# Patient Record
Sex: Male | Born: 1965 | Race: White | Hispanic: No | Marital: Married | Smoking: Former smoker
Health system: Southern US, Community
[De-identification: ages and names within clinical notes are randomized; demographics above are authoritative.]

## PROBLEM LIST (undated history)

## (undated) DIAGNOSIS — E785 Hyperlipidemia, unspecified: Secondary | ICD-10-CM

## (undated) DIAGNOSIS — E669 Obesity, unspecified: Secondary | ICD-10-CM

## (undated) DIAGNOSIS — F988 Other specified behavioral and emotional disorders with onset usually occurring in childhood and adolescence: Secondary | ICD-10-CM

## (undated) DIAGNOSIS — E291 Testicular hypofunction: Secondary | ICD-10-CM

## (undated) DIAGNOSIS — Z9989 Dependence on other enabling machines and devices: Secondary | ICD-10-CM

## (undated) DIAGNOSIS — G4733 Obstructive sleep apnea (adult) (pediatric): Secondary | ICD-10-CM

## (undated) DIAGNOSIS — E119 Type 2 diabetes mellitus without complications: Secondary | ICD-10-CM

## (undated) DIAGNOSIS — I10 Essential (primary) hypertension: Secondary | ICD-10-CM

## (undated) DIAGNOSIS — Z803 Family history of malignant neoplasm of breast: Secondary | ICD-10-CM

## (undated) HISTORY — DX: Family history of malignant neoplasm of breast: Z80.3

## (undated) HISTORY — DX: Other specified behavioral and emotional disorders with onset usually occurring in childhood and adolescence: F98.8

## (undated) HISTORY — DX: Obesity, unspecified: E66.9

## (undated) HISTORY — PX: COLONOSCOPY: SHX174

## (undated) HISTORY — PX: CARDIAC CATHETERIZATION: SHX172

## (undated) HISTORY — DX: Obstructive sleep apnea (adult) (pediatric): G47.33

## (undated) HISTORY — DX: Testicular hypofunction: E29.1

## (undated) HISTORY — DX: Dependence on other enabling machines and devices: Z99.89

## (undated) HISTORY — PX: RHINOPLASTY: SHX2354

## (undated) HISTORY — DX: Hyperlipidemia, unspecified: E78.5

## (undated) HISTORY — DX: Type 2 diabetes mellitus without complications: E11.9

## (undated) HISTORY — DX: Essential (primary) hypertension: I10

---

## 2012-11-26 ENCOUNTER — Ambulatory Visit: Payer: Self-pay | Admitting: Bariatrics

## 2012-11-28 ENCOUNTER — Ambulatory Visit: Payer: Self-pay | Admitting: Bariatrics

## 2012-11-28 DIAGNOSIS — Z01810 Encounter for preprocedural cardiovascular examination: Secondary | ICD-10-CM

## 2012-11-28 LAB — CBC WITH DIFFERENTIAL/PLATELET
Basophil #: 0.1 10*3/uL (ref 0.0–0.1)
Basophil %: 0.8 %
Eosinophil %: 2.6 %
HCT: 48.7 % (ref 40.0–52.0)
HGB: 17.1 g/dL (ref 13.0–18.0)
Lymphocyte #: 2.2 10*3/uL (ref 1.0–3.6)
MCH: 30.4 pg (ref 26.0–34.0)
MCHC: 35.1 g/dL (ref 32.0–36.0)
Monocyte #: 0.8 x10 3/mm (ref 0.2–1.0)
Monocyte %: 9 %
Neutrophil #: 5.1 10*3/uL (ref 1.4–6.5)
Neutrophil %: 61.1 %
Platelet: 217 10*3/uL (ref 150–440)
RBC: 5.64 10*6/uL (ref 4.40–5.90)
RDW: 13.8 % (ref 11.5–14.5)

## 2012-11-28 LAB — BILIRUBIN, DIRECT: Bilirubin, Direct: 0.2 mg/dL (ref 0.00–0.20)

## 2012-11-28 LAB — COMPREHENSIVE METABOLIC PANEL
Anion Gap: 4 — ABNORMAL LOW (ref 7–16)
Bilirubin,Total: 0.7 mg/dL (ref 0.2–1.0)
Chloride: 104 mmol/L (ref 98–107)
Co2: 29 mmol/L (ref 21–32)
EGFR (African American): >60
EGFR (Non-African Amer.): >60
Glucose: 107 mg/dL — ABNORMAL HIGH (ref 65–99)
Osmolality: 275 (ref 275–301)
Potassium: 3.9 mmol/L (ref 3.5–5.1)
SGOT(AST): 27 U/L (ref 15–37)
SGPT (ALT): 48 U/L (ref 12–78)
Sodium: 137 mmol/L (ref 136–145)

## 2012-11-28 LAB — LIPASE, BLOOD: Lipase: 350 U/L (ref 73–393)

## 2012-11-28 LAB — PROTIME-INR: INR: 0.9

## 2012-11-28 LAB — IRON AND TIBC
Iron Bind.Cap.(Total): 332 ug/dL (ref 250–450)
Iron Saturation: 38 %
Iron: 125 ug/dL (ref 65–175)

## 2012-11-28 LAB — MAGNESIUM: Magnesium: 1.8 mg/dL

## 2012-11-28 LAB — FOLATE: Folic Acid: 15.2 ng/mL (ref 3.1–100.0)

## 2012-11-28 LAB — TSH: Thyroid Stimulating Horm: 1.37 u[IU]/mL

## 2012-11-28 LAB — PHOSPHORUS: Phosphorus: 2.8 mg/dL (ref 2.5–4.9)

## 2012-12-06 ENCOUNTER — Ambulatory Visit: Payer: Self-pay | Admitting: Bariatrics

## 2012-12-16 ENCOUNTER — Ambulatory Visit: Payer: Self-pay | Admitting: Bariatrics

## 2013-01-22 ENCOUNTER — Ambulatory Visit: Payer: Self-pay | Admitting: Bariatrics

## 2013-04-05 HISTORY — PX: LAPAROSCOPIC GASTRIC SLEEVE RESECTION: SHX5895

## 2015-05-05 ENCOUNTER — Ambulatory Visit (INDEPENDENT_AMBULATORY_CARE_PROVIDER_SITE_OTHER): Payer: BLUE CROSS/BLUE SHIELD | Admitting: Family Medicine

## 2015-05-05 ENCOUNTER — Encounter: Payer: Self-pay | Admitting: Family Medicine

## 2015-05-05 VITALS — BP 135/81 | HR 65 | Temp 98.4°F | Ht 68.7 in | Wt 200.0 lb

## 2015-05-05 DIAGNOSIS — E291 Testicular hypofunction: Secondary | ICD-10-CM | POA: Diagnosis not present

## 2015-05-05 DIAGNOSIS — E119 Type 2 diabetes mellitus without complications: Secondary | ICD-10-CM | POA: Insufficient documentation

## 2015-05-05 DIAGNOSIS — E1122 Type 2 diabetes mellitus with diabetic chronic kidney disease: Secondary | ICD-10-CM | POA: Insufficient documentation

## 2015-05-05 MED ORDER — TESTOSTERONE 20.25 MG/ACT (1.62%) TD GEL: 4.0000 "application " | Freq: Every morning | TRANSDERMAL | Status: DC

## 2015-05-05 NOTE — Progress Notes (Signed)
   BP 135/81 mmHg  Pulse 65  Temp(Src) 98.4 F (36.9 C)  Ht 5' 8.7" (1.745 m)  Wt 200 lb (90.719 kg)  BMI 29.79 kg/m2  SpO2 98%   Subjective:    Patient ID: Jesse Dunlap, male    DOB: December 18, 1965, 49 y.o.   MRN: [id number]  HPI: Jesse Dunlap is a 49 y.o. male  Chief Complaint  Patient presents with  . Diabetes  . Hypogonadism  after surgery lost at least 60-70 lbs  No longer on meds for DM glu doing well Testosterone gel every day and doing well No c/o Normal energy Still working out   Relevant past medical, surgical, family and social history reviewed and updated as indicated. Interim medical history since our last visit reviewed. Allergies and medications reviewed and updated.  Review of Systems  Constitutional: Negative.   Respiratory: Negative.   Cardiovascular: Negative.     Per HPI unless specifically indicated above     Objective:    BP 135/81 mmHg  Pulse 65  Temp(Src) 98.4 F (36.9 C)  Ht 5' 8.7" (1.745 m)  Wt 200 lb (90.719 kg)  BMI 29.79 kg/m2  SpO2 98%  Wt Readings from Last 3 Encounters:  05/05/15 200 lb (90.719 kg)  10/21/14 20 lb (9.072 kg)    Physical Exam  Constitutional: He is oriented to person, place, and time. He appears well-developed and well-nourished. No distress.  HENT:  Head: Normocephalic and atraumatic.  Right Ear: Hearing normal.  Left Ear: Hearing normal.  Nose: Nose normal.  Eyes: Conjunctivae and lids are normal. Right eye exhibits no discharge. Left eye exhibits no discharge. No scleral icterus.  Cardiovascular: Normal rate, regular rhythm and normal heart sounds.   Pulmonary/Chest: Effort normal and breath sounds normal. No respiratory distress.  Musculoskeletal: Normal range of motion.  Neurological: He is alert and oriented to person, place, and time.  Skin: Skin is intact. No rash noted.  Psychiatric: He has a normal mood and affect. His speech is normal and behavior is normal. Judgment and thought content  normal. Cognition and memory are normal.        Assessment & Plan:   Problem List Items Addressed This Visit      Endocrine   Hypogonadism in male    The current medical regimen is effective;  continue present plan and medications.       Relevant Medications   Testosterone 20.25 MG/ACT (1.62%) GEL   Other Relevant Orders   CBC with Differential/Platelet   PSA   Testosterone   RESOLVED: Diabetes mellitus without complication - Primary   Relevant Orders   Bayer DCA Hb A1c Waived       Follow up plan: Return in about 6 months (around 11/04/2015) for Physical Exam and testosterone level.

## 2015-05-05 NOTE — Assessment & Plan Note (Signed)
The current medical regimen is effective;  continue present plan and medications.  

## 2015-05-10 LAB — CBC WITH DIFFERENTIAL/PLATELET
Basophils Absolute: 0 10*3/uL (ref 0.0–0.2)
Basos: 0 %
EOS (ABSOLUTE): 0.1 10*3/uL (ref 0.0–0.4)
Eos: 3 %
Hematocrit: 47.2 % (ref 37.5–51.0)
Hemoglobin: 16.5 g/dL (ref 12.6–17.7)
Immature Grans (Abs): 0 10*3/uL (ref 0.0–0.1)
Immature Granulocytes: 0 %
Lymphocytes Absolute: 1.8 10*3/uL (ref 0.7–3.1)
Lymphs: 34 %
MCH: 30.8 pg (ref 26.6–33.0)
MCHC: 35 g/dL (ref 31.5–35.7)
MCV: 88 fL (ref 79–97)
MONOCYTES: 10 %
Monocytes Absolute: 0.5 10*3/uL (ref 0.1–0.9)
Neutrophils Absolute: 2.8 10*3/uL (ref 1.4–7.0)
Neutrophils: 53 %
PLATELETS: 219 10*3/uL (ref 150–379)
RBC: 5.36 x10E6/uL (ref 4.14–5.80)
RDW: 13.8 % (ref 12.3–15.4)
WBC: 5.2 10*3/uL (ref 3.4–10.8)

## 2015-05-10 LAB — TESTOSTERONE: Testosterone: 556 ng/dL (ref 348–1197)

## 2015-05-10 LAB — HGB A1C W/O EAG: Hgb A1c MFr Bld: 5.6 % (ref 4.8–5.6)

## 2015-05-10 LAB — PSA: PROSTATE SPECIFIC AG, SERUM: 3.6 ng/mL (ref 0.0–4.0)

## 2015-11-15 ENCOUNTER — Encounter: Payer: BLUE CROSS/BLUE SHIELD | Admitting: Family Medicine

## 2016-01-04 ENCOUNTER — Encounter: Payer: BLUE CROSS/BLUE SHIELD | Admitting: Family Medicine

## 2016-02-06 ENCOUNTER — Encounter: Payer: Self-pay | Admitting: Family Medicine

## 2016-02-06 ENCOUNTER — Ambulatory Visit (INDEPENDENT_AMBULATORY_CARE_PROVIDER_SITE_OTHER): Payer: 59 | Admitting: Family Medicine

## 2016-02-06 VITALS — BP 136/86 | HR 79 | Temp 98.8°F | Ht 69.0 in | Wt 198.0 lb

## 2016-02-06 DIAGNOSIS — E119 Type 2 diabetes mellitus without complications: Secondary | ICD-10-CM

## 2016-02-06 DIAGNOSIS — Z1211 Encounter for screening for malignant neoplasm of colon: Secondary | ICD-10-CM

## 2016-02-06 DIAGNOSIS — E291 Testicular hypofunction: Secondary | ICD-10-CM

## 2016-02-06 DIAGNOSIS — Z0000 Encounter for general adult medical examination without abnormal findings: Secondary | ICD-10-CM

## 2016-02-06 DIAGNOSIS — I10 Essential (primary) hypertension: Secondary | ICD-10-CM | POA: Insufficient documentation

## 2016-02-06 LAB — MICROALBUMIN, URINE WAIVED
CREATININE, URINE WAIVED: 200 mg/dL (ref 10–300)
Microalb, Ur Waived: 10 mg/L (ref 0–19)

## 2016-02-06 LAB — URINALYSIS, ROUTINE W REFLEX MICROSCOPIC
Bilirubin, UA: NEGATIVE
Glucose, UA: NEGATIVE
KETONES UA: NEGATIVE
LEUKOCYTES UA: NEGATIVE
NITRITE UA: NEGATIVE
Protein, UA: NEGATIVE
RBC, UA: NEGATIVE
Specific Gravity, UA: 1.02 (ref 1.005–1.030)
Urobilinogen, Ur: 0.2 mg/dL (ref 0.2–1.0)
pH, UA: 6 (ref 5.0–7.5)

## 2016-02-06 LAB — BAYER DCA HB A1C WAIVED: HB A1C (BAYER DCA - WAIVED): 5.5 % (ref <7.0)

## 2016-02-06 MED ORDER — TESTOSTERONE 20.25 MG/ACT (1.62%) TD GEL: 4.0000 "application " | Freq: Every morning | TRANSDERMAL | Status: DC

## 2016-02-06 NOTE — Assessment & Plan Note (Addendum)
The current medical regimen is effective;  continue present plan and medications. Discuss checking every 6 mo

## 2016-02-06 NOTE — Assessment & Plan Note (Signed)
Diet controled 

## 2016-02-06 NOTE — Progress Notes (Signed)
BP 136/86 mmHg  Pulse 79  Temp(Src) 98.8 F (37.1 C)  Ht 5\' 9"  (1.753 m)  Wt 198 lb (89.812 kg)  BMI 29.23 kg/m2  SpO2 99%   Subjective:    Patient ID: Jesse Dunlap, male    DOB: February 21, 1966, 50 y.o.   MRN: [id number]  HPI: Jesse Dunlap is a 50 y.o. male  Chief Complaint  Patient presents with  . Annual Exam  Patient doing well, has maintained weight being off lost about 70 pounds or so blood pressure is now diet-controlled diabetes is now diet controlled Doing testosterone gel with no complaints or concerns applying gel every day  Relevant past medical, surgical, family and social history reviewed and updated as indicated. Interim medical history since our last visit reviewed. Allergies and medications reviewed and updated.  Review of Systems  Constitutional: Negative.   HENT: Negative.   Eyes: Negative.   Respiratory: Negative.   Cardiovascular: Negative.   Gastrointestinal: Negative.   Endocrine: Negative.   Genitourinary: Negative.   Musculoskeletal: Negative.   Skin: Negative.   Allergic/Immunologic: Negative.   Neurological: Negative.   Hematological: Negative.   Psychiatric/Behavioral: Negative.     Per HPI unless specifically indicated above     Objective:    BP 136/86 mmHg  Pulse 79  Temp(Src) 98.8 F (37.1 C)  Ht 5\' 9"  (1.753 m)  Wt 198 lb (89.812 kg)  BMI 29.23 kg/m2  SpO2 99%  Wt Readings from Last 3 Encounters:  02/06/16 198 lb (89.812 kg)  05/05/15 200 lb (90.719 kg)  10/21/14 20 lb (9.072 kg)    Physical Exam  Constitutional: He is oriented to person, place, and time. He appears well-developed and well-nourished.  HENT:  Head: Normocephalic and atraumatic.  Right Ear: External ear normal.  Left Ear: External ear normal.  Eyes: Conjunctivae and EOM are normal. Pupils are equal, round, and reactive to light.  Neck: Normal range of motion. Neck supple.  Cardiovascular: Normal rate, regular rhythm, normal heart sounds and intact  distal pulses.   Pulmonary/Chest: Effort normal and breath sounds normal.  Abdominal: Soft. Bowel sounds are normal. There is no splenomegaly or hepatomegaly.  Genitourinary: Rectum normal, prostate normal and penis normal.  Musculoskeletal: Normal range of motion.  Neurological: He is alert and oriented to person, place, and time. He has normal reflexes.  Skin: No rash noted. No erythema.  Psychiatric: He has a normal mood and affect. His behavior is normal. Judgment and thought content normal.    Results for orders placed or performed in visit on 05/05/15  CBC with Differential/Platelet  Result Value Ref Range   WBC 5.2 3.4 - 10.8 x10E3/uL   RBC 5.36 4.14 - 5.80 x10E6/uL   Hemoglobin 16.5 12.6 - 17.7 g/dL   Hematocrit 47.2 37.5 - 51.0 %   MCV 88 79 - 97 fL   MCH 30.8 26.6 - 33.0 pg   MCHC 35.0 31.5 - 35.7 g/dL   RDW 13.8 12.3 - 15.4 %   Platelets 219 150 - 379 x10E3/uL   Neutrophils 53 %   Lymphs 34 %   Monocytes 10 %   Eos 3 %   Basos 0 %   Neutrophils Absolute 2.8 1.4 - 7.0 x10E3/uL   Lymphocytes Absolute 1.8 0.7 - 3.1 x10E3/uL   Monocytes Absolute 0.5 0.1 - 0.9 x10E3/uL   EOS (ABSOLUTE) 0.1 0.0 - 0.4 x10E3/uL   Basophils Absolute 0.0 0.0 - 0.2 x10E3/uL   Immature Granulocytes 0 %   Immature Grans (  Abs) 0.0 0.0 - 0.1 x10E3/uL  PSA  Result Value Ref Range   Prostate Specific Ag, Serum 3.6 0.0 - 4.0 ng/mL  Testosterone  Result Value Ref Range   Testosterone 556 348 - 1197 ng/dL   Comment, Testosterone Comment   Hgb A1c w/o eAG  Result Value Ref Range   Hgb A1c MFr Bld 5.6 4.8 - 5.6 %      Assessment & Plan:   Problem List Items Addressed This Visit      Cardiovascular and Mediastinum   Essential hypertension    Diet controled        Endocrine   Diabetes mellitus without complication ([place]) - Primary   Relevant Orders   Microalbumin, Urine Waived   Bayer DCA Hb A1c Waived   Hypogonadism in male    The current medical regimen is effective;  continue  present plan and medications. Discuss checking every 6 mo       Relevant Medications   Testosterone 20.25 MG/ACT (1.62%) GEL   Other Relevant Orders   Testosterone    Other Visit Diagnoses    Routine general medical examination at a health care facility        Relevant Orders    CBC with Differential/Platelet    Comprehensive metabolic panel    Lipid Panel w/o Chol/HDL Ratio    PSA    TSH    Urinalysis, Routine w reflex microscopic (not at [facility])    Colon cancer screening        Relevant Orders    Ambulatory referral to General Surgery        Follow up plan: Return in about 6 months (around 08/07/2016) for CBC testosterone, PSA.

## 2016-02-07 ENCOUNTER — Encounter: Payer: Self-pay | Admitting: Family Medicine

## 2016-02-07 LAB — CBC WITH DIFFERENTIAL/PLATELET
BASOS: 1 %
Basophils Absolute: 0 10*3/uL (ref 0.0–0.2)
EOS (ABSOLUTE): 0 10*3/uL (ref 0.0–0.4)
EOS: 1 %
HEMATOCRIT: 44.3 % (ref 37.5–51.0)
Hemoglobin: 15.4 g/dL (ref 12.6–17.7)
Immature Grans (Abs): 0 10*3/uL (ref 0.0–0.1)
Immature Granulocytes: 0 %
LYMPHS ABS: 1.3 10*3/uL (ref 0.7–3.1)
Lymphs: 23 %
MCH: 30.6 pg (ref 26.6–33.0)
MCHC: 34.8 g/dL (ref 31.5–35.7)
MCV: 88 fL (ref 79–97)
MONOS ABS: 0.5 10*3/uL (ref 0.1–0.9)
Monocytes: 8 %
NEUTROS ABS: 4 10*3/uL (ref 1.4–7.0)
Neutrophils: 67 %
PLATELETS: 193 10*3/uL (ref 150–379)
RBC: 5.04 x10E6/uL (ref 4.14–5.80)
RDW: 13.6 % (ref 12.3–15.4)
WBC: 5.9 10*3/uL (ref 3.4–10.8)

## 2016-02-07 LAB — TSH: TSH: 2.48 u[IU]/mL (ref 0.450–4.500)

## 2016-02-07 LAB — COMPREHENSIVE METABOLIC PANEL
A/G RATIO: 2 (ref 1.2–2.2)
ALK PHOS: 57 IU/L (ref 39–117)
ALT: 15 IU/L (ref 0–44)
AST: 14 IU/L (ref 0–40)
Albumin: 4.5 g/dL (ref 3.5–5.5)
BILIRUBIN TOTAL: 0.8 mg/dL (ref 0.0–1.2)
BUN/Creatinine Ratio: 17 (ref 9–20)
BUN: 15 mg/dL (ref 6–24)
CALCIUM: 9.4 mg/dL (ref 8.7–10.2)
CHLORIDE: 104 mmol/L (ref 96–106)
CO2: 23 mmol/L (ref 18–29)
Creatinine, Ser: 0.9 mg/dL (ref 0.76–1.27)
GFR calc Af Amer: 114 mL/min/{1.73_m2} (ref >59)
GFR, EST NON AFRICAN AMERICAN: 99 mL/min/{1.73_m2} (ref >59)
GLOBULIN, TOTAL: 2.3 g/dL (ref 1.5–4.5)
Glucose: 109 mg/dL — ABNORMAL HIGH (ref 65–99)
POTASSIUM: 4.1 mmol/L (ref 3.5–5.2)
SODIUM: 144 mmol/L (ref 134–144)
Total Protein: 6.8 g/dL (ref 6.0–8.5)

## 2016-02-07 LAB — LIPID PANEL W/O CHOL/HDL RATIO
Cholesterol, Total: 187 mg/dL (ref 100–199)
HDL: 60 mg/dL (ref >39)
LDL Calculated: 99 mg/dL (ref 0–99)
TRIGLYCERIDES: 139 mg/dL (ref 0–149)
VLDL Cholesterol Cal: 28 mg/dL (ref 5–40)

## 2016-02-07 LAB — TESTOSTERONE: Testosterone: 521 ng/dL (ref 348–1197)

## 2016-02-07 LAB — PSA: PROSTATE SPECIFIC AG, SERUM: 3.3 ng/mL (ref 0.0–4.0)

## 2016-02-27 ENCOUNTER — Other Ambulatory Visit: Payer: Self-pay

## 2016-02-27 ENCOUNTER — Telehealth: Payer: Self-pay

## 2016-02-27 NOTE — Telephone Encounter (Signed)
Gastroenterology Pre-Procedure Review  Request Date: 03/16/16 Requesting Physician: Dr. Jeananne Rama  PATIENT REVIEW QUESTIONS: The patient responded to the following health history questions as indicated:    1. Are you having any GI issues? no 2. Do you have a personal history of Polyps? yes (Benign) 3. Do you have a family history of Colon Cancer or Polyps? no 4. Diabetes Mellitus? no 5. Joint replacements in the past 12 months?no 6. Major health problems in the past 3 months?no 7. Any artificial heart valves, MVP, or defibrillator?no    MEDICATIONS & ALLERGIES:    Patient reports the following regarding taking any anticoagulation/antiplatelet therapy:   Plavix, Coumadin, Eliquis, Xarelto, Lovenox, Pradaxa, Brilinta, or Effient? no Aspirin? no  Patient confirms/reports the following medications:  Current Outpatient Prescriptions  Medication Sig Dispense Refill  . Testosterone 20.25 MG/ACT (1.62%) GEL Place 4 application onto the skin every morning. 2 g 5   No current facility-administered medications for this visit.    Patient confirms/reports the following allergies:  No Known Allergies  No orders of the defined types were placed in this encounter.    AUTHORIZATION INFORMATION Primary Insurance: 1D#: Group #:  Secondary Insurance: 1D#: Group #:  SCHEDULE INFORMATION: Date: 03/16/16 Time: Location: [place]

## 2016-03-07 ENCOUNTER — Encounter: Payer: Self-pay | Admitting: *Deleted

## 2016-03-14 ENCOUNTER — Telehealth: Payer: Self-pay | Admitting: Family Medicine

## 2016-03-14 MED ORDER — TESTOSTERONE 50 MG/5GM (1%) TD GEL: 5.0000 g | Freq: Every day | TRANSDERMAL | Status: DC

## 2016-03-14 NOTE — Telephone Encounter (Signed)
Faxed rx to [facility]

## 2016-03-14 NOTE — Telephone Encounter (Signed)
Pt called stated insurance will not cover Androgel this needs to be changed to Testum or Androderm. Pharm is [facility] on [address] in [place]. Thanks.

## 2016-03-15 NOTE — Discharge Instructions (Signed)

## 2016-03-16 ENCOUNTER — Encounter
Admission: RE | Disposition: A | Discharge status: Home / Self Care | Payer: Self-pay | Source: Ambulatory Visit | Attending: Gastroenterology

## 2016-03-16 ENCOUNTER — Ambulatory Visit: Payer: Commercial Managed Care - HMO | Admitting: Anesthesiology

## 2016-03-16 ENCOUNTER — Ambulatory Visit
Admission: RE | Admit: 2016-03-16 | Discharge: 2016-03-16 | Disposition: A | Discharge status: Home / Self Care | Payer: Commercial Managed Care - HMO | Source: Ambulatory Visit | Attending: Gastroenterology | Admitting: Gastroenterology

## 2016-03-16 DIAGNOSIS — Z820 Family history of epilepsy and other diseases of the nervous system: Secondary | ICD-10-CM | POA: Diagnosis not present

## 2016-03-16 DIAGNOSIS — E291 Testicular hypofunction: Secondary | ICD-10-CM | POA: Diagnosis not present

## 2016-03-16 DIAGNOSIS — Z6828 Body mass index (BMI) 28.0-28.9, adult: Secondary | ICD-10-CM | POA: Insufficient documentation

## 2016-03-16 DIAGNOSIS — Z9884 Bariatric surgery status: Secondary | ICD-10-CM | POA: Diagnosis not present

## 2016-03-16 DIAGNOSIS — E119 Type 2 diabetes mellitus without complications: Secondary | ICD-10-CM | POA: Diagnosis not present

## 2016-03-16 DIAGNOSIS — F988 Other specified behavioral and emotional disorders with onset usually occurring in childhood and adolescence: Secondary | ICD-10-CM | POA: Insufficient documentation

## 2016-03-16 DIAGNOSIS — I10 Essential (primary) hypertension: Secondary | ICD-10-CM | POA: Insufficient documentation

## 2016-03-16 DIAGNOSIS — Z87891 Personal history of nicotine dependence: Secondary | ICD-10-CM | POA: Insufficient documentation

## 2016-03-16 DIAGNOSIS — Z8249 Family history of ischemic heart disease and other diseases of the circulatory system: Secondary | ICD-10-CM | POA: Insufficient documentation

## 2016-03-16 DIAGNOSIS — D125 Benign neoplasm of sigmoid colon: Secondary | ICD-10-CM | POA: Diagnosis not present

## 2016-03-16 DIAGNOSIS — D122 Benign neoplasm of ascending colon: Secondary | ICD-10-CM | POA: Diagnosis not present

## 2016-03-16 DIAGNOSIS — D123 Benign neoplasm of transverse colon: Secondary | ICD-10-CM | POA: Diagnosis not present

## 2016-03-16 DIAGNOSIS — Z803 Family history of malignant neoplasm of breast: Secondary | ICD-10-CM | POA: Diagnosis not present

## 2016-03-16 DIAGNOSIS — K5730 Diverticulosis of large intestine without perforation or abscess without bleeding: Secondary | ICD-10-CM | POA: Diagnosis not present

## 2016-03-16 DIAGNOSIS — G4733 Obstructive sleep apnea (adult) (pediatric): Secondary | ICD-10-CM | POA: Insufficient documentation

## 2016-03-16 DIAGNOSIS — Z1211 Encounter for screening for malignant neoplasm of colon: Secondary | ICD-10-CM | POA: Insufficient documentation

## 2016-03-16 DIAGNOSIS — Z79899 Other long term (current) drug therapy: Secondary | ICD-10-CM | POA: Insufficient documentation

## 2016-03-16 DIAGNOSIS — Z833 Family history of diabetes mellitus: Secondary | ICD-10-CM | POA: Diagnosis not present

## 2016-03-16 DIAGNOSIS — G4730 Sleep apnea, unspecified: Secondary | ICD-10-CM | POA: Insufficient documentation

## 2016-03-16 DIAGNOSIS — E785 Hyperlipidemia, unspecified: Secondary | ICD-10-CM | POA: Insufficient documentation

## 2016-03-16 DIAGNOSIS — E669 Obesity, unspecified: Secondary | ICD-10-CM | POA: Diagnosis not present

## 2016-03-16 HISTORY — PX: COLONOSCOPY WITH PROPOFOL: SHX5780

## 2016-03-16 HISTORY — PX: POLYPECTOMY: SHX5525

## 2016-03-16 SURGERY — COLONOSCOPY WITH PROPOFOL
Anesthesia: Monitor Anesthesia Care | Wound class: Contaminated

## 2016-03-16 MED ORDER — LACTATED RINGERS IV SOLN: INTRAVENOUS | Status: DC

## 2016-03-16 MED ORDER — LACTATED RINGERS IV SOLN
INTRAVENOUS | Status: DC
  Administered 2016-03-16: 07:00:00 via INTRAVENOUS

## 2016-03-16 MED ORDER — ACETAMINOPHEN 160 MG/5ML PO SOLN: 325.0000 mg | ORAL | Status: DC | PRN

## 2016-03-16 MED ORDER — STERILE WATER FOR IRRIGATION IR SOLN
Status: DC | PRN
  Administered 2016-03-16: 08:00:00

## 2016-03-16 MED ORDER — LIDOCAINE HCL (CARDIAC) 20 MG/ML IV SOLN
INTRAVENOUS | Status: DC | PRN
  Administered 2016-03-16: 20 mg via INTRAVENOUS

## 2016-03-16 MED ORDER — PROPOFOL 10 MG/ML IV BOLUS
INTRAVENOUS | Status: DC | PRN
  Administered 2016-03-16 (×2): 20 mg via INTRAVENOUS
  Administered 2016-03-16: 100 mg via INTRAVENOUS
  Administered 2016-03-16 (×7): 20 mg via INTRAVENOUS

## 2016-03-16 MED ORDER — ACETAMINOPHEN 325 MG PO TABS
325.0000 mg | ORAL_TABLET | ORAL | Status: DC | PRN
Start: 2016-03-16 — End: 2016-03-16

## 2016-03-16 SURGICAL SUPPLY — 22 items
CANISTER SUCT 1200ML W/VALVE (MISCELLANEOUS) ×4 IMPLANT
CLIP HMST 235XBRD CATH ROT (MISCELLANEOUS) IMPLANT
CLIP RESOLUTION 360 11X235 (MISCELLANEOUS)
FCP ESCP3.2XJMB 240X2.8X (MISCELLANEOUS)
FORCEPS BIOP RAD 4 LRG CAP 4 (CUTTING FORCEPS) IMPLANT
FORCEPS BIOP RJ4 240 W/NDL (MISCELLANEOUS)
FORCEPS ESCP3.2XJMB 240X2.8X (MISCELLANEOUS) IMPLANT
GOWN CVR UNV OPN BCK APRN NK (MISCELLANEOUS) ×4 IMPLANT
GOWN ISOL THUMB LOOP REG UNIV (MISCELLANEOUS) ×4
INJECTOR VARIJECT VIN23 (MISCELLANEOUS) IMPLANT
KIT DEFENDO VALVE AND CONN (KITS) IMPLANT
KIT ENDO PROCEDURE OLY (KITS) ×4 IMPLANT
MARKER SPOT ENDO TATTOO 5ML (MISCELLANEOUS) IMPLANT
PAD GROUND ADULT SPLIT (MISCELLANEOUS) ×4 IMPLANT
PROBE APC STR FIRE (PROBE) IMPLANT
SNARE SHORT THROW 13M SML OVAL (MISCELLANEOUS) ×4 IMPLANT
SNARE SHORT THROW 30M LRG OVAL (MISCELLANEOUS) IMPLANT
SNARE SNG USE RND 15MM (INSTRUMENTS) IMPLANT
SPOT EX ENDOSCOPIC TATTOO (MISCELLANEOUS)
TRAP ETRAP POLY (MISCELLANEOUS) ×4 IMPLANT
VARIJECT INJECTOR VIN23 (MISCELLANEOUS)
WATER STERILE IRR 250ML POUR (IV SOLUTION) ×4 IMPLANT

## 2016-03-16 NOTE — Anesthesia Preprocedure Evaluation (Signed)
Anesthesia Evaluation  Patient identified by MRN, date of birth, ID band  Reviewed: Allergy & Precautions, H&P , NPO status , Patient's Chart, lab work & pertinent test results  Airway Mallampati: II  TM Distance: >3 FB Neck ROM: full    Dental no notable dental hx.    Pulmonary sleep apnea , former smoker,    Pulmonary exam normal        Cardiovascular hypertension,  Rhythm:regular Rate:Normal     Neuro/Psych    GI/Hepatic   Endo/Other    Renal/GU      Musculoskeletal   Abdominal   Peds  Hematology   Anesthesia Other Findings   Reproductive/Obstetrics                             Anesthesia Physical  Anesthesia Plan  ASA: II  Anesthesia Plan: MAC   Post-op Pain Management:    Induction:   Airway Management Planned:   Additional Equipment:   Intra-op Plan:   Post-operative Plan:   Informed Consent: I have reviewed the patients History and Physical, chart, labs and discussed the procedure including the risks, benefits and alternatives for the proposed anesthesia with the patient or authorized representative who has indicated his/her understanding and acceptance.     Plan Discussed with: CRNA  Anesthesia Plan Comments:         Anesthesia Quick Evaluation  

## 2016-03-16 NOTE — Op Note (Signed)
[facility]enterology Patient Name: Jesse Dunlap Procedure Date: 03/16/2016 7:44 AM MRN: [id number] Account #: [id number] Date of Birth: 08/13/1966 Admit Type: Outpatient Age: 50 Room: [facility] OR ROOM 01 Gender: Male Note Status: Finalized Procedure:            Colonoscopy Indications:          Screening for colorectal malignant neoplasm Providers:            Lucilla Lame, MD Referring MD:         Guadalupe Maple, MD (Referring MD) Medicines:            Propofol per Anesthesia Complications:        No immediate complications. Procedure:            Pre-Anesthesia Assessment:                       - Prior to the procedure, a History and Physical was                        performed, and patient medications and allergies were                        reviewed. The patient's tolerance of previous                        anesthesia was also reviewed. The risks and benefits of                        the procedure and the sedation options and risks were                        discussed with the patient. All questions were                        answered, and informed consent was obtained. Prior                        Anticoagulants: The patient has taken no previous                        anticoagulant or antiplatelet agents. ASA Grade                        Assessment: II - A patient with mild systemic disease.                        After reviewing the risks and benefits, the patient was                        deemed in satisfactory condition to undergo the                        procedure.                       After obtaining informed consent, the colonoscope was                        passed under direct vision. Throughout the procedure,  the patient's blood pressure, pulse, and oxygen                        saturations were monitored continuously. The Olympus CF                        H180AL colonoscope (S#: [id number]) was introduced through                         the anus and advanced to the the cecum, identified by                        appendiceal orifice and ileocecal valve. The                        colonoscopy was performed without difficulty. The                        patient tolerated the procedure well. The quality of                        the bowel preparation was excellent. Findings:      The perianal and digital rectal examinations were normal.      Three sessile polyps were found in the ascending colon. The polyps were       6 to 9 mm in size. These polyps were removed with a cold snare.       Resection and retrieval were complete.      A 4 mm polyp was found in the transverse colon. The polyp was sessile.       The polyp was removed with a cold biopsy forceps. Resection and       retrieval were complete.      Two sessile polyps were found in the sigmoid colon. The polyps were 5 to       7 mm in size. These polyps were removed with a cold snare. Resection and       retrieval were complete.      A 10 mm polyp was found in the sigmoid colon. The polyp was       pedunculated. The polyp was removed with a hot snare. Resection and       retrieval were complete.      A few small-mouthed diverticula were found in the sigmoid colon. Impression:           - Three 6 to 9 mm polyps in the ascending colon,                        removed with a cold snare. Resected and retrieved.                       - One 4 mm polyp in the transverse colon, removed with                        a cold biopsy forceps. Resected and retrieved.                       - Two 5 to 7 mm polyps in the sigmoid colon, removed  with a cold snare. Resected and retrieved.                       - One 10 mm polyp in the sigmoid colon, removed with a                        hot snare. Resected and retrieved.                       - Diverticulosis in the sigmoid colon. Recommendation:       - Await pathology results.                        - Repeat colonoscopy in 3 years if polyp adenoma and 10                        years if hyperplastic Procedure Code(s):    --- Professional ---                       [id number], Colonoscopy, flexible; with removal of tumor(s),                        polyp(s), or other lesion(s) by snare technique                       45380, 34, Colonoscopy, flexible; with biopsy, single                        or multiple Diagnosis Code(s):    --- Professional ---                       Z12.11, Encounter for screening for malignant neoplasm                        of colon                       D12.2, Benign neoplasm of ascending colon                       D12.5, Benign neoplasm of sigmoid colon CPT copyright 2016 American Medical Association. All rights reserved. The codes documented in this report are preliminary and upon coder review may  be revised to meet current compliance requirements. Lucilla Lame, MD 03/16/2016 8:08:45 AM This report has been signed electronically. Number of Addenda: 0 Note Initiated On: 03/16/2016 7:44 AM Scope Withdrawal Time: 0 hours 14 minutes 58 seconds  Total Procedure Duration: 0 hours 17 minutes 48 seconds       [facility]

## 2016-03-16 NOTE — Anesthesia Procedure Notes (Signed)
Procedure Name: MAC Performed by: Zyan Mirkin Pre-anesthesia Checklist: Patient identified, Emergency Drugs available, Suction available, Patient being monitored and Timeout performed Patient Re-evaluated:Patient Re-evaluated prior to inductionOxygen Delivery Method: Nasal cannula       

## 2016-03-16 NOTE — Anesthesia Postprocedure Evaluation (Signed)
Anesthesia Post Note  Patient: Jesse Dunlap  Procedure(s) Performed: Procedure(s) (LRB): COLONOSCOPY WITH PROPOFOL (N/A) POLYPECTOMY  Patient location during evaluation: PACU Anesthesia Type: MAC Level of consciousness: awake and alert and oriented Pain management: satisfactory to patient Vital Signs Assessment: post-procedure vital signs reviewed and stable Respiratory status: spontaneous breathing, nonlabored ventilation and respiratory function stable Cardiovascular status: blood pressure returned to baseline and stable Postop Assessment: Adequate PO intake and No signs of nausea or vomiting Anesthetic complications: no    Raliegh Ip

## 2016-03-16 NOTE — H&P (Signed)
  [facility]  [address], [place], [place] [address] Phone: [phone] Fax : [phone]  Primary Care Physician:  Golden Pop, MD Primary Gastroenterologist:  Dr. Allen Norris  Pre-Procedure History & Physical: HPI:  Jesse Dunlap is a 50 y.o. male is here for a screening colonoscopy.   Past Medical History  Diagnosis Date  . Hyperlipidemia   . Attention deficit disorder   . Hypogonadism in male   . Obesity (BMI 35.0-39.9 without comorbidity) ([place])   . Diabetes mellitus without complication ([place])     prior to gastric sleeve and wt loss  . OSA on CPAP   . Hypertension     prior to gastric sleeve and wt loss    Past Surgical History  Procedure Laterality Date  . 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 Rhinoplasty    . Cardiac catheterization  approx 2000  . Colonoscopy  approx 2005    had polyp    Prior to Admission medications   Medication Sig Start Date End Date Taking? Authorizing Provider  testosterone (ANDROGEL) 50 MG/5GM (1%) GEL Place 5 g onto the skin daily. 03/14/16  Yes Guadalupe Maple, MD    Allergies as of 02/27/2016  . (No Known Allergies)    Family History  Problem Relation Age of Onset  . Cancer Mother     Breast  . Diabetes Mother   . Diabetes Father   . Hypertension Father   . Cancer Sister     Breast  . Alzheimer's disease Maternal Grandmother     Social History   Social History  . Marital Status: Married    Spouse Name: N/A  . Number of Children: N/A  . Years of Education: N/A   Occupational History  . Not on file.   Social History Main Topics  . Smoking status: Former Smoker    Quit date: 11/05/1986  . Smokeless tobacco: Never Used  . Alcohol Use: 3.0 oz/week    0 Standard drinks or equivalent, 5 Cans of beer per week     Comment:    . Drug Use: No  . Sexual Activity: Not on file   Other Topics Concern  . Not on file   Social History Narrative    Review of Systems: See HPI, otherwise  negative ROS  Physical Exam: BP 132/88 mmHg  Pulse 61  Temp(Src) 97.7 F (36.5 C)  Resp 16  Ht 5\' 9"  (1.753 m)  Wt 196 lb (88.905 kg)  BMI 28.93 kg/m2  SpO2 98% General:   Alert,  pleasant and cooperative in NAD Head:  Normocephalic and atraumatic. Neck:  Supple; no masses or thyromegaly. Lungs:  Clear throughout to auscultation.    Heart:  Regular rate and rhythm. Abdomen:  Soft, nontender and nondistended. Normal bowel sounds, without guarding, and without rebound.   Neurologic:  Alert and  oriented x4;  grossly normal neurologically.  Impression/Plan: Jesse Dunlap is now here to undergo a screening colonoscopy.  Risks, benefits, and alternatives regarding colonoscopy have been reviewed with the patient.  Questions have been answered.  All parties agreeable.

## 2016-03-16 NOTE — Transfer of Care (Signed)
Immediate Anesthesia Transfer of Care Note  Patient: Jesse Dunlap  Procedure(s) Performed: Procedure(s) with comments: COLONOSCOPY WITH PROPOFOL (N/A) - CPAP POLYPECTOMY  Patient Location: PACU  Anesthesia Type: MAC  Level of Consciousness: awake, alert  and patient cooperative  Airway and Oxygen Therapy: Patient Spontanous Breathing and Patient connected to supplemental oxygen  Post-op Assessment: Post-op Vital signs reviewed, Patient's Cardiovascular Status Stable, Respiratory Function Stable, Patent Airway and No signs of Nausea or vomiting  Post-op Vital Signs: Reviewed and stable  Complications: No apparent anesthesia complications

## 2016-03-19 ENCOUNTER — Encounter: Payer: Self-pay | Admitting: Gastroenterology

## 2016-03-20 ENCOUNTER — Encounter: Payer: Self-pay | Admitting: Gastroenterology

## 2016-03-26 ENCOUNTER — Other Ambulatory Visit: Payer: Self-pay | Admitting: Family Medicine

## 2016-03-26 MED ORDER — TESTOSTERONE 50 MG/5GM (1%) TD GEL: 5.0000 g | Freq: Every day | TRANSDERMAL | Status: DC

## 2016-03-27 ENCOUNTER — Telehealth: Payer: Self-pay | Admitting: Family Medicine

## 2016-03-27 NOTE — Telephone Encounter (Signed)
faxed

## 2016-03-27 NOTE — Telephone Encounter (Signed)
Pharmacy called and stated that androgel needed a prior authorization. Number to call is [phone].

## 2016-03-27 NOTE — Telephone Encounter (Signed)
Pt called Jesse Dunlap in [place] informed pt they have not received an RX for Androgel replacement. Please send new RX to [facility] in [place]. Thanks.

## 2016-04-03 MED ORDER — TESTOSTERONE 50 MG/5GM (1%) TD GEL: 5.0000 g | Freq: Every day | TRANSDERMAL | Status: DC

## 2016-04-03 NOTE — Telephone Encounter (Signed)
printed

## 2016-04-03 NOTE — Telephone Encounter (Signed)
Pt would like to see if he could possibly testum instead of androgel.

## 2016-04-03 NOTE — Telephone Encounter (Signed)
Patient wants to know if you would write Testim

## 2016-04-03 NOTE — Addendum Note (Signed)
Addended byGolden Pop on: 04/03/2016 05:08 PM   Modules accepted: Orders, Medications

## 2016-04-04 ENCOUNTER — Telehealth: Payer: Self-pay

## 2016-04-04 ENCOUNTER — Other Ambulatory Visit: Payer: Self-pay | Admitting: Family Medicine

## 2016-04-04 NOTE — Telephone Encounter (Signed)
We are now getting a request for Androderm from the pharmacy, MAC would like to speak with patient about seeing Urology

## 2016-04-05 NOTE — Telephone Encounter (Signed)
Phone call Discussed with patient insurance hassles and using testosterone patient ready to just stop has lost a lot of weight and feels that needed we'll stop testosterone recheck testosterone at his next physical

## 2016-08-07 ENCOUNTER — Encounter: Payer: Self-pay | Admitting: Family Medicine

## 2016-08-07 ENCOUNTER — Ambulatory Visit (INDEPENDENT_AMBULATORY_CARE_PROVIDER_SITE_OTHER): Payer: 59 | Admitting: Family Medicine

## 2016-08-07 DIAGNOSIS — I10 Essential (primary) hypertension: Secondary | ICD-10-CM

## 2016-08-07 DIAGNOSIS — E291 Testicular hypofunction: Secondary | ICD-10-CM | POA: Diagnosis not present

## 2016-08-07 NOTE — Assessment & Plan Note (Signed)
Discuss hypogonadism Will check testosterone before 10 AM and if low will refer to urology because of multiple insurance hassles and developing appropriate treatment.

## 2016-08-07 NOTE — Progress Notes (Signed)
BP 132/82 (BP Location: Left Arm)   Pulse 73   Temp 98.5 F (36.9 C)   Ht 5' 10.6" (1.793 m)   Wt 199 lb 9.6 oz (90.5 kg)   BMI 28.15 kg/m    Subjective:    Patient ID: Jesse Dunlap, male    DOB: 02/02/1966, 50 y.o.   MRN: [id number]  HPI: Jesse Dunlap is a 50 y.o. male  Chief Complaint  Patient presents with  . Follow-up    6 month  . Fatigue    Felt different when stopped taking Testosteron pump.    Patient follow-up testosterone had been using [employer] insurance stopped covering that and with weight loss was hoping that testosterone would recover. Patient has been at least 6 months off testosterone. Doesn't feel testosterone has recovered still feeling with fatigue, tired and draggy as before he went on testosterone replacement and did not feel this way while on replacement.  Relevant past medical, surgical, family and social history reviewed and updated as indicated. Interim medical history since our last visit reviewed. Allergies and medications reviewed and updated.  Review of Systems  Constitutional: Negative.   Respiratory: Negative.   Cardiovascular: Negative.     Per HPI unless specifically indicated above     Objective:    BP 132/82 (BP Location: Left Arm)   Pulse 73   Temp 98.5 F (36.9 C)   Ht 5' 10.6" (1.793 m)   Wt 199 lb 9.6 oz (90.5 kg)   BMI 28.15 kg/m   Wt Readings from Last 3 Encounters:  08/07/16 199 lb 9.6 oz (90.5 kg)  03/16/16 196 lb (88.9 kg)  02/06/16 198 lb (89.8 kg)    Physical Exam  Constitutional: He is oriented to person, place, and time. He appears well-developed and well-nourished. No distress.  HENT:  Head: Normocephalic and atraumatic.  Right Ear: Hearing normal.  Left Ear: Hearing normal.  Nose: Nose normal.  Eyes: Conjunctivae and lids are normal. Right eye exhibits no discharge. Left eye exhibits no discharge. No scleral icterus.  Cardiovascular: Normal rate, regular rhythm and normal heart sounds.     Pulmonary/Chest: Effort normal and breath sounds normal. No respiratory distress.  Musculoskeletal: Normal range of motion.  Neurological: He is alert and oriented to person, place, and time.  Skin: Skin is intact. No rash noted.  Psychiatric: He has a normal mood and affect. His speech is normal and behavior is normal. Judgment and thought content normal. Cognition and memory are normal.    Results for orders placed or performed in visit on 02/06/16  CBC with Differential/Platelet  Result Value Ref Range   WBC 5.9 3.4 - 10.8 x10E3/uL   RBC 5.04 4.14 - 5.80 x10E6/uL   Hemoglobin 15.4 12.6 - 17.7 g/dL   Hematocrit 44.3 37.5 - 51.0 %   MCV 88 79 - 97 fL   MCH 30.6 26.6 - 33.0 pg   MCHC 34.8 31.5 - 35.7 g/dL   RDW 13.6 12.3 - 15.4 %   Platelets 193 150 - 379 x10E3/uL   Neutrophils 67 %   Lymphs 23 %   Monocytes 8 %   Eos 1 %   Basos 1 %   Neutrophils Absolute 4.0 1.4 - 7.0 x10E3/uL   Lymphocytes Absolute 1.3 0.7 - 3.1 x10E3/uL   Monocytes Absolute 0.5 0.1 - 0.9 x10E3/uL   EOS (ABSOLUTE) 0.0 0.0 - 0.4 x10E3/uL   Basophils Absolute 0.0 0.0 - 0.2 x10E3/uL   Immature Granulocytes 0 %   Immature  Grans (Abs) 0.0 0.0 - 0.1 x10E3/uL  Comprehensive metabolic panel  Result Value Ref Range   Glucose 109 (H) 65 - 99 mg/dL   BUN 15 6 - 24 mg/dL   Creatinine, Ser 0.90 0.76 - 1.27 mg/dL   GFR calc non Af Amer 99 >59 mL/min/1.73   GFR calc Af Amer 114 >59 mL/min/1.73   BUN/Creatinine Ratio 17 9 - 20   Sodium 144 134 - 144 mmol/L   Potassium 4.1 3.5 - 5.2 mmol/L   Chloride 104 96 - 106 mmol/L   CO2 23 18 - 29 mmol/L   Calcium 9.4 8.7 - 10.2 mg/dL   Total Protein 6.8 6.0 - 8.5 g/dL   Albumin 4.5 3.5 - 5.5 g/dL   Globulin, Total 2.3 1.5 - 4.5 g/dL   Albumin/Globulin Ratio 2.0 1.2 - 2.2   Bilirubin Total 0.8 0.0 - 1.2 mg/dL   Alkaline Phosphatase 57 39 - 117 IU/L   AST 14 0 - 40 IU/L   ALT 15 0 - 44 IU/L  Lipid Panel w/o Chol/HDL Ratio  Result Value Ref Range   Cholesterol, Total 187  100 - 199 mg/dL   Triglycerides 139 0 - 149 mg/dL   HDL 60 >39 mg/dL   VLDL Cholesterol Cal 28 5 - 40 mg/dL   LDL Calculated 99 0 - 99 mg/dL  PSA  Result Value Ref Range   Prostate Specific Ag, Serum 3.3 0.0 - 4.0 ng/mL  TSH  Result Value Ref Range   TSH 2.480 0.450 - 4.500 uIU/mL  Urinalysis, Routine w reflex microscopic (not at [facility])  Result Value Ref Range   Specific Gravity, UA 1.020 1.005 - 1.030   pH, UA 6.0 5.0 - 7.5   Color, UA Yellow Yellow   Appearance Ur Clear Clear   Leukocytes, UA Negative Negative   Protein, UA Negative Negative/Trace   Glucose, UA Negative Negative   Ketones, UA Negative Negative   RBC, UA Negative Negative   Bilirubin, UA Negative Negative   Urobilinogen, Ur 0.2 0.2 - 1.0 mg/dL   Nitrite, UA Negative Negative  Microalbumin, Urine Waived  Result Value Ref Range   Microalb, Ur Waived 10 0 - 19 mg/L   Creatinine, Urine Waived 200 10 - 300 mg/dL   Microalb/Creat Ratio <30 <30 mg/g  Bayer DCA Hb A1c Waived  Result Value Ref Range   Bayer DCA Hb A1c Waived 5.5 <7.0 %  Testosterone  Result Value Ref Range   Testosterone 521 348 - 1,197 ng/dL   Comment, Testosterone Comment       Assessment & Plan:   Problem List Items Addressed This Visit      Cardiovascular and Mediastinum   Essential hypertension    Controled with no meds         Endocrine   Hypogonadism in male    Discuss hypogonadism Will check testosterone before 10 AM and if low will refer to urology because of multiple insurance hassles and developing appropriate treatment.      Relevant Orders   Testosterone    Other Visit Diagnoses   None.      Follow up plan: Return in about 6 months (around 02/05/2017) for Physical Exam.

## 2016-08-07 NOTE — Assessment & Plan Note (Addendum)
Controled with no meds

## 2016-08-08 ENCOUNTER — Other Ambulatory Visit: Payer: 59

## 2016-08-08 DIAGNOSIS — E291 Testicular hypofunction: Secondary | ICD-10-CM

## 2016-08-09 ENCOUNTER — Encounter: Payer: Self-pay | Admitting: Family Medicine

## 2016-08-09 LAB — TESTOSTERONE: TESTOSTERONE: 336 ng/dL (ref 264–916)

## 2017-02-11 ENCOUNTER — Encounter: Payer: 59 | Admitting: Family Medicine

## 2017-03-11 ENCOUNTER — Encounter: Payer: Self-pay | Admitting: Family Medicine

## 2017-03-11 ENCOUNTER — Ambulatory Visit (INDEPENDENT_AMBULATORY_CARE_PROVIDER_SITE_OTHER): Payer: 59 | Admitting: Family Medicine

## 2017-03-11 DIAGNOSIS — I10 Essential (primary) hypertension: Secondary | ICD-10-CM

## 2017-03-11 DIAGNOSIS — F321 Major depressive disorder, single episode, moderate: Secondary | ICD-10-CM | POA: Diagnosis not present

## 2017-03-11 MED ORDER — FLUOXETINE HCL 20 MG PO TABS: 20.0000 mg | ORAL_TABLET | Freq: Every day | ORAL | 1 refills | Status: DC

## 2017-03-11 MED ORDER — TRAZODONE HCL 50 MG PO TABS: 25.0000 mg | ORAL_TABLET | Freq: Every evening | ORAL | 3 refills | Status: DC | PRN

## 2017-03-11 NOTE — Assessment & Plan Note (Addendum)
Discussed depression care and treatment use of medication concerned about suicidal ideation. We'll start Prozac cautions about too soon too good too fast. Patient will notify us if any of those concerns. Discussed slow nature of improvement. Discussed suicidal risk and ideation patient will notify us if any of those concerns. Discuss sleep and trazodone.

## 2017-03-11 NOTE — Assessment & Plan Note (Signed)
Elevated blood pressure but under great stress depression will observe blood pressure for now.

## 2017-03-11 NOTE — Progress Notes (Signed)
   BP (!) 142/90 (BP Location: Left Arm)   Pulse 80   Wt 203 lb (92.1 kg)   SpO2 99%   BMI 28.63 kg/m    Subjective:    Patient ID: Jesse Dunlap, male    DOB: 1966-09-25, 51 y.o.   MRN: [id number]  HPI: Jesse Dunlap is a 51 y.o. male  Depression Patient feeling sad and blue and depressed's been ongoing for 10+ months getting worse on review and discussion with patient no suicidal ideation. Patient with depression score of 15 poor sleep no energy stressful job. Has been craving carbohydrates and drinking 1-2 drinks at night of bourbon Has thought about exercise but just hasn't been able to get up and go. Bothered by specially poor sleep early morning wakening  Relevant past medical, surgical, family and social history reviewed and updated as indicated. Interim medical history since our last visit reviewed. Allergies and medications reviewed and updated.  Review of Systems  Constitutional: Negative.   Respiratory: Negative.   Cardiovascular: Negative.     Per HPI unless specifically indicated above     Objective:    BP (!) 142/90 (BP Location: Left Arm)   Pulse 80   Wt 203 lb (92.1 kg)   SpO2 99%   BMI 28.63 kg/m   Wt Readings from Last 3 Encounters:  03/11/17 203 lb (92.1 kg)  08/07/16 199 lb 9.6 oz (90.5 kg)  03/16/16 196 lb (88.9 kg)    Physical Exam  Constitutional: He is oriented to person, place, and time. He appears well-developed and well-nourished.  HENT:  Head: Normocephalic and atraumatic.  Eyes: Conjunctivae and EOM are normal.  Neck: Normal range of motion.  Cardiovascular: Normal rate, regular rhythm and normal heart sounds.   Pulmonary/Chest: Effort normal and breath sounds normal.  Musculoskeletal: Normal range of motion.  Neurological: He is alert and oriented to person, place, and time.  Skin: No erythema.  Psychiatric: He has a normal mood and affect. His behavior is normal. Judgment and thought content normal.    Results for orders  placed or performed in visit on 08/08/16  Testosterone  Result Value Ref Range   Testosterone 336 264 - 916 ng/dL      Assessment & Plan:   Problem List Items Addressed This Visit      Cardiovascular and Mediastinum   Essential hypertension    Elevated blood pressure but under great stress depression will observe blood pressure for now.        Other   Depression, major, single episode, moderate ([place])    Discussed depression care and treatment use of medication concerned about suicidal ideation. We'll start Prozac cautions about too soon too good too fast. Patient will notify us if any of those concerns. Discussed slow nature of improvement. Discussed suicidal risk and ideation patient will notify us if any of those concerns. Discuss sleep and trazodone.      Relevant Medications   traZODone (DESYREL) 50 MG tablet   FLUoxetine (PROZAC) 20 MG tablet       Follow up plan: Return in about 4 weeks (around 04/08/2017) for Physical Exam.

## 2017-03-14 ENCOUNTER — Encounter: Payer: Self-pay | Admitting: Family Medicine

## 2017-04-11 ENCOUNTER — Ambulatory Visit (INDEPENDENT_AMBULATORY_CARE_PROVIDER_SITE_OTHER): Payer: 59 | Admitting: Family Medicine

## 2017-04-11 ENCOUNTER — Encounter: Payer: Self-pay | Admitting: Family Medicine

## 2017-04-11 VITALS — BP 130/87 | HR 61 | Ht 69.69 in | Wt 203.0 lb

## 2017-04-11 DIAGNOSIS — Z0000 Encounter for general adult medical examination without abnormal findings: Secondary | ICD-10-CM

## 2017-04-11 DIAGNOSIS — I10 Essential (primary) hypertension: Secondary | ICD-10-CM | POA: Diagnosis not present

## 2017-04-11 DIAGNOSIS — Z125 Encounter for screening for malignant neoplasm of prostate: Secondary | ICD-10-CM

## 2017-04-11 DIAGNOSIS — E119 Type 2 diabetes mellitus without complications: Secondary | ICD-10-CM

## 2017-04-11 DIAGNOSIS — F321 Major depressive disorder, single episode, moderate: Secondary | ICD-10-CM

## 2017-04-11 DIAGNOSIS — Z13220 Encounter for screening for lipoid disorders: Secondary | ICD-10-CM

## 2017-04-11 DIAGNOSIS — Z1329 Encounter for screening for other suspected endocrine disorder: Secondary | ICD-10-CM

## 2017-04-11 LAB — URINALYSIS, ROUTINE W REFLEX MICROSCOPIC
Bilirubin, UA: NEGATIVE
GLUCOSE, UA: NEGATIVE
KETONES UA: NEGATIVE
Leukocytes, UA: NEGATIVE
Nitrite, UA: NEGATIVE
PROTEIN UA: NEGATIVE
RBC, UA: NEGATIVE
SPEC GRAV UA: 1.02 (ref 1.005–1.030)
Urobilinogen, Ur: 0.2 mg/dL (ref 0.2–1.0)
pH, UA: 5.5 (ref 5.0–7.5)

## 2017-04-11 LAB — MICROSCOPIC EXAMINATION
Bacteria, UA: NONE SEEN
RBC MICROSCOPIC, UA: NONE SEEN /HPF (ref 0–?)

## 2017-04-11 MED ORDER — BENAZEPRIL HCL 40 MG PO TABS: 40.0000 mg | ORAL_TABLET | Freq: Every day | ORAL | 3 refills | Status: DC

## 2017-04-11 MED ORDER — TRAZODONE HCL 50 MG PO TABS: 25.0000 mg | ORAL_TABLET | Freq: Every evening | ORAL | 3 refills | Status: DC | PRN

## 2017-04-11 MED ORDER — FLUOXETINE HCL 10 MG PO TABS: 10.0000 mg | ORAL_TABLET | Freq: Every day | ORAL | 2 refills | Status: DC

## 2017-04-11 MED ORDER — FLUOXETINE HCL 20 MG PO TABS: 20.0000 mg | ORAL_TABLET | Freq: Every day | ORAL | 1 refills | Status: DC

## 2017-04-11 NOTE — Assessment & Plan Note (Signed)
Discuss hypertension risk-benefit treatment. Will start with Benzapril 40 mg 1 a day. Patient education given.

## 2017-04-11 NOTE — Assessment & Plan Note (Signed)
Discussed depression slight improvement will increase fluoxetine from 20 mg a day to 30 mg a day by taking the 20 mg and a 10 mg tablet. If it turns out 3, 10 mg tablets are cheaper Will switch.

## 2017-04-11 NOTE — Progress Notes (Signed)
BP 130/87   Pulse 61   Ht 5' 9.69" (1.77 m)   Wt 203 lb (92.1 kg)   SpO2 98%   BMI 29.39 kg/m    Subjective:    Patient ID: Jesse Dunlap, male    DOB: [birth year], 51 y.o.   MRN: [id number]  HPI: Jesse Dunlap is a 51 y.o. male  Chief Complaint  Patient presents with  . Annual Exam  Patient also follow-up depression pH Q9 score 21 last month today score of 14. Set another way patient reports 30-40% improved. All in all in discussion patient doesn't feel that much improved still having a lot of stress especially with work and takes a lot of energy to engage. Sleeps doing okay at best with trazodone 50. 25 mg to do anything. Patient with intermittent elevated blood pressure is not taking any blood pressure medications but on chart review his had multiple elevated blood pressure readings. Patient with no signs or symptoms of hypertension.  Relevant past medical, surgical, family and social history reviewed and updated as indicated. Interim medical history since our last visit reviewed. Allergies and medications reviewed and updated.  Review of Systems  Constitutional: Negative.   HENT: Negative.   Eyes: Negative.   Respiratory: Negative.   Cardiovascular: Negative.   Gastrointestinal: Negative.   Endocrine: Negative.   Genitourinary: Negative.   Musculoskeletal: Negative.   Skin: Negative.   Allergic/Immunologic: Negative.   Neurological: Negative.   Hematological: Negative.   Psychiatric/Behavioral: Negative.     Per HPI unless specifically indicated above     Objective:    BP 130/87   Pulse 61   Ht 5' 9.69" (1.77 m)   Wt 203 lb (92.1 kg)   SpO2 98%   BMI 29.39 kg/m   Wt Readings from Last 3 Encounters:  04/11/17 203 lb (92.1 kg)  03/11/17 203 lb (92.1 kg)  08/07/16 199 lb 9.6 oz (90.5 kg)    Physical Exam  Constitutional: He is oriented to person, place, and time. He appears well-developed and well-nourished.  HENT:  Head: Normocephalic and  atraumatic.  Right Ear: External ear normal.  Left Ear: External ear normal.  Eyes: Conjunctivae and EOM are normal. Pupils are equal, round, and reactive to light.  Neck: Normal range of motion. Neck supple.  Cardiovascular: Normal rate, regular rhythm, normal heart sounds and intact distal pulses.   Pulmonary/Chest: Effort normal and breath sounds normal.  Abdominal: Soft. Bowel sounds are normal. There is no splenomegaly or hepatomegaly.  Genitourinary: Rectum normal, prostate normal and penis normal.  Musculoskeletal: Normal range of motion.  Neurological: He is alert and oriented to person, place, and time. He has normal reflexes.  Skin: No rash noted. No erythema.  Psychiatric: He has a normal mood and affect. His behavior is normal. Judgment and thought content normal.  EKG normal sinus rhythm with normal EKG.  Results for orders placed or performed in visit on 08/08/16  Testosterone  Result Value Ref Range   Testosterone 336 264 - 916 ng/dL      Assessment & Plan:   Problem List Items Addressed This Visit      Cardiovascular and Mediastinum   Essential hypertension    Discuss hypertension risk-benefit treatment. Will start with Benzapril 40 mg 1 a day. Patient education given.      Relevant Medications   benazepril (LOTENSIN) 40 MG tablet   Other Relevant Orders   CBC with Differential/Platelet   Comprehensive metabolic panel   Urinalysis, Routine w reflex  microscopic   EKG 12-Lead (Completed)     Endocrine   RESOLVED: Diabetes mellitus without complication (HCC)   Relevant Medications   benazepril (LOTENSIN) 40 MG tablet   Other Relevant Orders   CBC with Differential/Platelet   Comprehensive metabolic panel   Urinalysis, Routine w reflex microscopic     Other   Depression, major, single episode, moderate (HCC)    Discussed depression slight improvement will increase fluoxetine from 20 mg a day to 30 mg a day by taking the 20 mg and a 10 mg tablet. If it  turns out 3, 10 mg tablets are cheaper Will switch.      Relevant Medications   traZODone (DESYREL) 50 MG tablet   FLUoxetine (PROZAC) 20 MG tablet   FLUoxetine (PROZAC) 10 MG tablet    Other Visit Diagnoses    Annual physical exam    -  Primary   Screening cholesterol level       Relevant Orders   Lipid panel   Thyroid disorder screening       Relevant Orders   TSH   Prostate cancer screening       Relevant Orders   PSA       Follow up plan: Return in about 4 weeks (around 05/09/2017) for BMP, BP and depression check.

## 2017-04-12 LAB — CBC WITH DIFFERENTIAL/PLATELET
BASOS: 1 %
Basophils Absolute: 0 10*3/uL (ref 0.0–0.2)
EOS (ABSOLUTE): 0.2 10*3/uL (ref 0.0–0.4)
EOS: 3 %
HEMATOCRIT: 43.8 % (ref 37.5–51.0)
Hemoglobin: 14.8 g/dL (ref 13.0–17.7)
IMMATURE GRANULOCYTES: 0 %
Immature Grans (Abs): 0 10*3/uL (ref 0.0–0.1)
LYMPHS ABS: 1.6 10*3/uL (ref 0.7–3.1)
Lymphs: 27 %
MCH: 30.8 pg (ref 26.6–33.0)
MCHC: 33.8 g/dL (ref 31.5–35.7)
MCV: 91 fL (ref 79–97)
MONOS ABS: 0.5 10*3/uL (ref 0.1–0.9)
Monocytes: 8 %
NEUTROS PCT: 61 %
Neutrophils Absolute: 3.8 10*3/uL (ref 1.4–7.0)
PLATELETS: 193 10*3/uL (ref 150–379)
RBC: 4.8 x10E6/uL (ref 4.14–5.80)
RDW: 13.5 % (ref 12.3–15.4)
WBC: 6.2 10*3/uL (ref 3.4–10.8)

## 2017-04-12 LAB — LIPID PANEL
CHOL/HDL RATIO: 3.8 ratio (ref 0.0–5.0)
Cholesterol, Total: 201 mg/dL — ABNORMAL HIGH (ref 100–199)
HDL: 53 mg/dL (ref >39)
Triglycerides: 404 mg/dL — ABNORMAL HIGH (ref 0–149)

## 2017-04-12 LAB — COMPREHENSIVE METABOLIC PANEL
ALBUMIN: 4.7 g/dL (ref 3.5–5.5)
ALT: 15 IU/L (ref 0–44)
AST: 17 IU/L (ref 0–40)
Albumin/Globulin Ratio: 2 (ref 1.2–2.2)
Alkaline Phosphatase: 65 IU/L (ref 39–117)
BUN / CREAT RATIO: 18 (ref 9–20)
BUN: 16 mg/dL (ref 6–24)
Bilirubin Total: 0.5 mg/dL (ref 0.0–1.2)
CALCIUM: 9 mg/dL (ref 8.7–10.2)
CHLORIDE: 105 mmol/L (ref 96–106)
CO2: 23 mmol/L (ref 18–29)
CREATININE: 0.9 mg/dL (ref 0.76–1.27)
GFR, EST AFRICAN AMERICAN: 113 mL/min/{1.73_m2} (ref >59)
GFR, EST NON AFRICAN AMERICAN: 98 mL/min/{1.73_m2} (ref >59)
GLUCOSE: 83 mg/dL (ref 65–99)
Globulin, Total: 2.3 g/dL (ref 1.5–4.5)
Potassium: 4 mmol/L (ref 3.5–5.2)
Sodium: 142 mmol/L (ref 134–144)
TOTAL PROTEIN: 7 g/dL (ref 6.0–8.5)

## 2017-04-12 LAB — PSA: Prostate Specific Ag, Serum: 3.2 ng/mL (ref 0.0–4.0)

## 2017-04-12 LAB — TSH: TSH: 1.27 u[IU]/mL (ref 0.450–4.500)

## 2017-04-13 ENCOUNTER — Encounter: Payer: Self-pay | Admitting: Family Medicine

## 2017-05-14 ENCOUNTER — Ambulatory Visit: Payer: 59 | Admitting: Family Medicine

## 2017-07-11 ENCOUNTER — Other Ambulatory Visit: Payer: Self-pay | Admitting: Family Medicine

## 2017-07-11 DIAGNOSIS — F321 Major depressive disorder, single episode, moderate: Secondary | ICD-10-CM

## 2017-08-08 DIAGNOSIS — Z23 Encounter for immunization: Secondary | ICD-10-CM | POA: Diagnosis not present

## 2017-08-09 ENCOUNTER — Telehealth: Payer: Self-pay | Admitting: Family Medicine

## 2017-08-09 NOTE — Telephone Encounter (Signed)
I did call and speak to patient. He is requesting that his prozac be increased to 40 mg daily. Pt also would like 90 day supply. Please advise.   Per 04/11/17 OV:   Depression, major, single episode, moderate (HCC)     Discussed depression slight improvement will increase fluoxetine from 20 mg a day to 30 mg a day by taking the 20 mg and a 10 mg tablet. If it turns out 3, 10 mg tablets are cheaper Will switch.      Relevant Medications   traZODone (DESYREL) 50 MG tablet   FLUoxetine (PROZAC) 20 MG tablet   FLUoxetine (PROZAC) 10 MG tablet

## 2017-08-09 NOTE — Telephone Encounter (Signed)
Pt aware. He will call back to schedule.

## 2017-08-09 NOTE — Telephone Encounter (Signed)
Was supposed to f/u in 1 month after June visit - needs appt for dose change and overdue for f/u

## 2017-08-09 NOTE — Telephone Encounter (Signed)
Patient requesting prescription for prozac 10 mg and 20 mg increased or if he could get a stronger alternative. Patient asked if could get a 0 day supply instead due to his daily commute but if not he is fine. Patient uses [facility] in [place], [place].  Patient would like a call back in regards to medication and refill.  Please Advise.  Thank you

## 2017-08-13 ENCOUNTER — Ambulatory Visit (INDEPENDENT_AMBULATORY_CARE_PROVIDER_SITE_OTHER): Payer: 59 | Admitting: Family Medicine

## 2017-08-13 ENCOUNTER — Encounter: Payer: Self-pay | Admitting: Family Medicine

## 2017-08-13 VITALS — BP 121/80 | HR 74 | Temp 98.1°F | Ht 69.0 in | Wt 199.2 lb

## 2017-08-13 DIAGNOSIS — I10 Essential (primary) hypertension: Secondary | ICD-10-CM | POA: Diagnosis not present

## 2017-08-13 DIAGNOSIS — F321 Major depressive disorder, single episode, moderate: Secondary | ICD-10-CM | POA: Diagnosis not present

## 2017-08-13 DIAGNOSIS — G4700 Insomnia, unspecified: Secondary | ICD-10-CM

## 2017-08-13 MED ORDER — FLUOXETINE HCL 40 MG PO CAPS: 40.0000 mg | ORAL_CAPSULE | Freq: Every day | ORAL | 1 refills | Status: DC

## 2017-08-13 MED ORDER — BENAZEPRIL HCL 40 MG PO TABS
40.0000 mg | ORAL_TABLET | Freq: Every day | ORAL | 1 refills | Status: DC
Start: 2017-08-13 — End: 2018-02-06

## 2017-08-13 NOTE — Progress Notes (Signed)
BP 121/80 (BP Location: Left Arm, Patient Position: Sitting, Cuff Size: Normal)   Pulse 74   Temp 98.1 F (36.7 C)   Ht 5\' 9"  (1.753 m)   Wt 199 lb 3.2 oz (90.4 kg)   SpO2 98%   BMI 29.42 kg/m    Subjective:    Patient ID: Jesse Dunlap, male    DOB: 1966/05/07, 51 y.o.   MRN: [id number]  HPI: Jesse Dunlap is a 51 y.o. male  Chief Complaint  Patient presents with  . Follow-up  . Medication Management  . Depression   Patient presents today for chronic dz management. Taking 30 mg prozac currently, wanting to increase dose. Doing fairly well with medication. No side effects noted, taking faithfully. Denies SI/HI.   Taking trazodone about 1/week for sleep. Keeps him asleep but makes him feel very groggy the next morning. Wanting to keep current dose.   Taking benazepril for BP with no issue. BPs under good control when checked at home. Denies CP, SOB, syncope, visual disturbances.   Past Medical History:  Diagnosis Date  . Attention deficit disorder   . Diabetes mellitus without complication ([place])    prior to gastric sleeve and wt loss  . Hyperlipidemia   . Hypertension    prior to gastric sleeve and wt loss  . Hypogonadism in male   . Obesity (BMI 35.0-39.9 without comorbidity)   . OSA on CPAP    Social History   Social History  . Marital status: Married    Spouse name: N/A  . Number of children: N/A  . Years of education: N/A   Occupational History  . Not on file.   Social History Main Topics  . Smoking status: Former Smoker    Quit date: 11/05/1986  . Smokeless tobacco: Never Used  . Alcohol use 2.4 - 3.0 oz/week    4 - 5 Glasses of wine per week     Comment:    . Drug use: No  . Sexual activity: Not on file   Other Topics Concern  . Not on file   Social History Narrative  . No narrative on file    Relevant past medical, surgical, family and social history reviewed and updated as indicated. Interim medical history since our last visit  reviewed. Allergies and medications reviewed and updated.  Review of Systems  Constitutional: Negative.   HENT: Negative.   Respiratory: Negative.   Cardiovascular: Negative.   Gastrointestinal: Negative.   Musculoskeletal: Negative.   Neurological: Negative.   Psychiatric/Behavioral: Negative.    Per HPI unless specifically indicated above     Objective:    BP 121/80 (BP Location: Left Arm, Patient Position: Sitting, Cuff Size: Normal)   Pulse 74   Temp 98.1 F (36.7 C)   Ht 5\' 9"  (1.753 m)   Wt 199 lb 3.2 oz (90.4 kg)   SpO2 98%   BMI 29.42 kg/m   Wt Readings from Last 3 Encounters:  08/13/17 199 lb 3.2 oz (90.4 kg)  04/11/17 203 lb (92.1 kg)  03/11/17 203 lb (92.1 kg)    Physical Exam  Constitutional: He is oriented to person, place, and time. He appears well-developed and well-nourished.  HENT:  Head: Atraumatic.  Eyes: Pupils are equal, round, and reactive to light. Conjunctivae are normal.  Neck: Normal range of motion. Neck supple.  Cardiovascular: Normal rate and normal heart sounds.   Pulmonary/Chest: Effort normal and breath sounds normal.  Musculoskeletal: Normal range of motion.  Neurological: He is  alert and oriented to person, place, and time.  Skin: Skin is warm and dry.  Psychiatric: He has a normal mood and affect. His behavior is normal.  Nursing note and vitals reviewed.     Assessment & Plan:   Problem List Items Addressed This Visit      Cardiovascular and Mediastinum   Essential hypertension    Stable and WNL, continue benazepril regimen and lifestyle modifications.       Relevant Medications   benazepril (LOTENSIN) 40 MG tablet     Other   Depression, major, single episode, moderate (HCC) - Primary    Will increase prozac to 40 mg daily and assess for benefit.       Relevant Medications   FLUoxetine (PROZAC) 40 MG capsule    Other Visit Diagnoses    Insomnia, unspecified type       Continue trazodone prn, stable        Follow up plan: Return in about 8 months (around 04/13/2018) for CPE, Depression f/u.

## 2017-08-15 ENCOUNTER — Ambulatory Visit: Payer: 59 | Admitting: Family Medicine

## 2017-08-15 NOTE — Assessment & Plan Note (Addendum)
Stable and WNL, continue benazepril regimen and lifestyle modifications.

## 2017-08-15 NOTE — Assessment & Plan Note (Signed)
Will increase prozac to 40 mg daily and assess for benefit.

## 2017-12-13 ENCOUNTER — Encounter: Payer: Self-pay | Admitting: Unknown Physician Specialty

## 2017-12-13 ENCOUNTER — Ambulatory Visit (INDEPENDENT_AMBULATORY_CARE_PROVIDER_SITE_OTHER): Payer: 59 | Admitting: Unknown Physician Specialty

## 2017-12-13 VITALS — BP 149/98 | HR 76 | Temp 100.9°F | Wt 206.2 lb

## 2017-12-13 DIAGNOSIS — J189 Pneumonia, unspecified organism: Secondary | ICD-10-CM

## 2017-12-13 DIAGNOSIS — J181 Lobar pneumonia, unspecified organism: Secondary | ICD-10-CM

## 2017-12-13 DIAGNOSIS — R05 Cough: Secondary | ICD-10-CM | POA: Diagnosis not present

## 2017-12-13 DIAGNOSIS — R509 Fever, unspecified: Secondary | ICD-10-CM

## 2017-12-13 DIAGNOSIS — R059 Cough, unspecified: Secondary | ICD-10-CM

## 2017-12-13 LAB — VERITOR FLU A/B WAIVED
INFLUENZA A: NEGATIVE
Influenza B: NEGATIVE

## 2017-12-13 MED ORDER — ALBUTEROL SULFATE HFA 108 (90 BASE) MCG/ACT IN AERS: 2.0000 | INHALATION_SPRAY | Freq: Four times a day (QID) | RESPIRATORY_TRACT | 2 refills | Status: DC | PRN

## 2017-12-13 MED ORDER — HYDROCOD POLST-CPM POLST ER 10-8 MG/5ML PO SUER: 5.0000 mL | Freq: Two times a day (BID) | ORAL | 0 refills | Status: DC | PRN

## 2017-12-13 MED ORDER — DOXYCYCLINE HYCLATE 100 MG PO TABS: 100.0000 mg | ORAL_TABLET | Freq: Two times a day (BID) | ORAL | 0 refills | Status: DC

## 2017-12-13 NOTE — Progress Notes (Signed)
BP (!) 149/98   Pulse 76   Temp (!) 100.9 F (38.3 C) (Oral)   Wt 206 lb 3.2 oz (93.5 kg)   SpO2 98%   BMI 30.45 kg/m    Subjective:    Patient ID: Jesse Dunlap, male    DOB: 12/14/1965, 52 y.o.   MRN: [id number]  HPI: Jesse Dunlap is a 53 y.o. male  Chief Complaint  Patient presents with  . URI    pt states he has had body aches, congestion, cough, and sinus pressure since Monday    URI   This is a new problem. Episode onset: 2 1/2 days. The problem has been unchanged. The maximum temperature recorded prior to his arrival was 100.4 - 100.9 F (chillls). Associated symptoms include congestion, coughing, headaches, rhinorrhea and wheezing. Pertinent negatives include no abdominal pain, chest pain, diarrhea, dysuria, ear pain, joint pain, joint swelling, nausea, neck pain, plugged ear sensation, rash, sinus pain, sneezing, sore throat, swollen glands or vomiting. Associated symptoms comments: Body aches . He has tried decongestant for the symptoms.    Relevant past medical, surgical, family and social history reviewed and updated as indicated. Interim medical history since our last visit reviewed. Allergies and medications reviewed and updated.  Review of Systems  HENT: Positive for congestion and rhinorrhea. Negative for ear pain, sinus pain, sneezing and sore throat.   Respiratory: Positive for cough and wheezing.   Cardiovascular: Negative for chest pain.  Gastrointestinal: Negative for abdominal pain, diarrhea, nausea and vomiting.  Genitourinary: Negative for dysuria.  Musculoskeletal: Negative for joint pain and neck pain.  Skin: Negative for rash.  Neurological: Positive for headaches.    Per HPI unless specifically indicated above     Objective:    BP (!) 149/98   Pulse 76   Temp (!) 100.9 F (38.3 C) (Oral)   Wt 206 lb 3.2 oz (93.5 kg)   SpO2 98%   BMI 30.45 kg/m   Wt Readings from Last 3 Encounters:  12/13/17 206 lb 3.2 oz (93.5 kg)  08/13/17 199  lb 3.2 oz (90.4 kg)  04/11/17 203 lb (92.1 kg)    Physical Exam  Constitutional: He is oriented to person, place, and time. He appears well-developed and well-nourished. No distress.  HENT:  Head: Normocephalic and atraumatic.  Right Ear: Tympanic membrane and ear canal normal.  Left Ear: Tympanic membrane and ear canal normal.  Nose: Rhinorrhea present. Right sinus exhibits no maxillary sinus tenderness and no frontal sinus tenderness. Left sinus exhibits no maxillary sinus tenderness and no frontal sinus tenderness.  Mouth/Throat: Uvula is midline. Posterior oropharyngeal edema present.  Eyes: Conjunctivae and lids are normal. Right eye exhibits no discharge. Left eye exhibits no discharge. No scleral icterus.  Neck: Neck supple.  Cardiovascular: Normal rate, regular rhythm and normal heart sounds.  Pulmonary/Chest: Effort normal. No respiratory distress. He has wheezes. He has rhonchi in the left lower field. He has no rales.  Abdominal: Normal appearance. There is no splenomegaly or hepatomegaly.  Musculoskeletal: Normal range of motion.  Neurological: He is alert and oriented to person, place, and time.  Skin: Skin is warm, dry and intact. No rash noted. No pallor.  Psychiatric: He has a normal mood and affect. His behavior is normal. Judgment and thought content normal.  Nursing note and vitals reviewed.  Flu test negative    Assessment & Plan:   Problem List Items Addressed This Visit    None    Visit Diagnoses  Fever, unspecified fever cause    -  Primary   Relevant Orders   Veritor Flu A/B Waived   DG Chest 2 View   Cough       Chest x-ray if no improvement.  Rx for Tussionex   Relevant Orders   DG Chest 2 View   Pneumonia of left lower lobe due to infectious organism [facility])       Treat for pneumonia due to clinical signs of rhonchi.  Rx for Doxycycline BID for 10 days   Relevant Medications   doxycycline (VIBRA-TABS) 100 MG tablet   albuterol (PROVENTIL  HFA;VENTOLIN HFA) 108 (90 Base) MCG/ACT inhaler   chlorpheniramine-HYDROcodone (TUSSIONEX PENNKINETIC ER) 10-8 MG/5ML SUER       Follow up plan: Return if symptoms worsen or fail to improve.

## 2017-12-16 ENCOUNTER — Other Ambulatory Visit: Payer: Self-pay | Admitting: Family Medicine

## 2017-12-16 ENCOUNTER — Encounter: Payer: Self-pay | Admitting: Genetic Counselor

## 2017-12-16 ENCOUNTER — Inpatient Hospital Stay: Payer: 59 | Attending: Genetic Counselor | Admitting: Genetic Counselor

## 2017-12-16 ENCOUNTER — Inpatient Hospital Stay: Payer: 59

## 2017-12-16 DIAGNOSIS — D125 Benign neoplasm of sigmoid colon: Secondary | ICD-10-CM

## 2017-12-16 DIAGNOSIS — Z315 Encounter for genetic counseling: Secondary | ICD-10-CM

## 2017-12-16 DIAGNOSIS — Z803 Family history of malignant neoplasm of breast: Secondary | ICD-10-CM

## 2017-12-16 DIAGNOSIS — Z8042 Family history of malignant neoplasm of prostate: Secondary | ICD-10-CM | POA: Diagnosis not present

## 2017-12-16 DIAGNOSIS — Z808 Family history of malignant neoplasm of other organs or systems: Secondary | ICD-10-CM | POA: Diagnosis not present

## 2017-12-16 DIAGNOSIS — Z801 Family history of malignant neoplasm of trachea, bronchus and lung: Secondary | ICD-10-CM

## 2017-12-16 NOTE — Progress Notes (Signed)
REFERRING PROVIDER: Guadalupe Maple, MD [place], [place] [address]  PRIMARY PROVIDER:  Guadalupe Maple, MD  PRIMARY REASON FOR VISIT:  1. Benign neoplasm of sigmoid colon   2. Family history of breast cancer      HISTORY OF PRESENT ILLNESS:   Jesse Dunlap, a 52 y.o. male, was seen for a [facility] cancer genetics consultation at the request of Dr. Jeananne Dunlap due to a family history of cancer and a known family mutation in [place].  Jesse Dunlap presents to clinic today to discuss the possibility of a hereditary predisposition to cancer, genetic testing, and to further clarify his future cancer risks, as well as potential cancer risks for family members.   Jesse Dunlap is a 52 y.o. male with no personal history of cancer.  His mother recently underwent genetic testing and was found to carry a CHEK2 mutation.  CANCER HISTORY:   No history exists.     RISK FACTORS:  Colonoscopy: yes; 7 tubular adenoma found on colonoscopy in 2017. Any excessive radiation exposure in the past:  No Prostate Cancer: Screened at PCP office Dermatology: no annual screening  Past Medical History:  Diagnosis Date  . Attention deficit disorder   . Diabetes mellitus without complication ([facility])    prior to gastric sleeve and wt loss  . Family history of breast cancer   . Hyperlipidemia   . Hypertension    prior to gastric sleeve and wt loss  . Hypogonadism in male   . Obesity (BMI 35.0-39.9 without comorbidity)   . OSA on CPAP     Past Surgical History:  Procedure Laterality Date  . CARDIAC CATHETERIZATION  approx 2000  . COLONOSCOPY  approx 2005   had polyp  . COLONOSCOPY WITH PROPOFOL N/A 03/16/2016   Procedure: COLONOSCOPY WITH PROPOFOL;  Surgeon: Lucilla Lame, MD;  Location: [facility];  Service: Endoscopy;  Laterality: N/A;  CPAP  . LAPAROSCOPIC GASTRIC SLEEVE RESECTION  04/2013  . POLYPECTOMY  03/16/2016   Procedure: POLYPECTOMY;  Surgeon: Lucilla Lame, MD;  Location: [facility];  Service: Endoscopy;;  . RHINOPLASTY      Social History   Socioeconomic History  . Marital status: Married    Spouse name: Not on file  . Number of children: Not on file  . Years of education: Not on file  . Highest education level: Not on file  Social Needs  . Financial resource strain: Not on file  . Food insecurity - worry: Not on file  . Food insecurity - inability: Not on file  . Transportation needs - medical: Not on file  . Transportation needs - non-medical: Not on file  Occupational History  . Not on file  Tobacco Use  . Smoking status: Former Smoker    Packs/day: 1.50    Years: 10.00    Pack years: 15.00    Types: Cigarettes    Last attempt to quit: 11/05/1986    Years since quitting: 31.1  . Smokeless tobacco: Never Used  Substance and Sexual Activity  . Alcohol use: Yes    Alcohol/week: 2.4 - 3.0 oz    Types: 4 - 5 Glasses of wine per week    Comment:    . Drug use: No  . Sexual activity: Not on file  Other Topics Concern  . Not on file  Social History Narrative  . Not on file     FAMILY HISTORY:  We obtained a detailed, 4-generation family history.  Significant  diagnoses are listed below: Family History  Problem Relation Age of Onset  . Diabetes Mother   . Breast cancer Mother 32       CHEK2 pos  . Diabetes Father   . Hypertension Father   . Skin cancer Father   . Breast cancer Sister 35  . Alzheimer's disease Maternal Grandmother   . Breast cancer Maternal Grandmother        dx over 19  . Prostate cancer Maternal Grandfather        dx in his 71s-70s  . Lung cancer Paternal Grandfather   . Prostate cancer Other        PGF's father    The patient has one daughter who is cancer free.  He had one sister who developed breast cancer at 37 and died at 50.  She has two daughters who are cancer free. Both parents are living.  The patient's father has had SCC skin cancer.  He has one sister who is cancer free.  The paternal  grandparents are deceased.  His grandmother died at 69 and the grandfather died of lung cancer.  The paternal grandfather's father had prostate cancer.  The patient's mother had breast cancer at 55.  She was recently tested and found to carry a CHEK2 mutation.  She had two brothers one who died at 25 and the other who is living and cancer free.  Both maternal grandparents had cancer.  The grandmother had breast cancer and the grandfather had prostate cancer.  Patient's maternal ancestors are of [place] descent, and paternal ancestors are of [place] descent. There is no reported Ashkenazi Jewish ancestry. There is no known consanguinity.  GENETIC COUNSELING ASSESSMENT: Jesse Dunlap is a 52 y.o. male with a family history of breast and prostate cancer and a known family mutation in [place] which is somewhat suggestive of a hereditary breast cancer syndrome and predisposition to cancer. We, therefore, discussed and recommended the following at today's visit.   DISCUSSION: We discussed that about 5-10% of breast cancer is hereditary.  CHEK2 is a moderate risk gene, increasing the risk for breast cancer in women to 23-48% lifetime risk.  It also increases the risk for male breast cancer to 1%.  Other cancers are also increased, including prostate and colon cancer.  Based on his standing in the family history, the patient has a 50% chance of having this mutation.  If he does not inherit it, then he could not pass this onto his daughter.  We reviewed the characteristics, features and inheritance patterns of hereditary cancer syndromes. We also discussed genetic testing, including the appropriate family members to test, the process of testing, insurance coverage and turn-around-time for results. We discussed the implications of a negative, positive and/or variant of uncertain significant result. We recommended Jesse Dunlap pursue genetic testing for the CHEK2 c.1100delC gene mutation.   The patient's grandmother  underwent genetic testing at [facility]. Complimentary genetic testing is performed on family members for up to 90 days after the test results have been completed. Jesse Dunlap genetic testing falls within this 90 day period, and therefore she should not receive a bill for genetic testing from [facility].  PLAN: After considering the risks, benefits, and limitations, Jesse Dunlap  provided informed consent to pursue genetic testing and the blood sample was sent to [facility] for analysis of the CHEK2 c.1100delC mutation. Results should be available within approximately 2-3 weeks' time, at which point they will be disclosed by telephone to Jesse Dunlap, as  will any additional recommendations warranted by these results. Jesse Dunlap will receive a summary of his genetic counseling visit and a copy of his results once available. This information will also be available in Epic. We encouraged Jesse Dunlap to remain in contact with cancer genetics annually so that we can continuously update the family history and inform him of any changes in cancer genetics and testing that may be of benefit for his family. Jesse Dunlap questions were answered to his satisfaction today. Our contact information was provided should additional questions or concerns arise.  Lastly, we encouraged Jesse Dunlap to remain in contact with cancer genetics annually so that we can continuously update the family history and inform him of any changes in cancer genetics and testing that may be of benefit for this family.   Mr.  Dunlap questions were answered to his satisfaction today. Our contact information was provided should additional questions or concerns arise. Thank you for the referral and allowing [place] to share in the care of your patient.   Chaylee Ehrsam P. Florene Glen, [place], [facility] Certified Genetic Counselor [email or web address] phone: [phone]  The patient was seen for a total of 45 minutes in face-to-face genetic  counseling.  This patient was discussed with Drs. Magrinat, Lindi Adie and/or Burr Medico who agrees with the above.    _______________________________________________________________________ For Office Staff:  Number of people involved in session: 4 Was an Intern/ student involved with case: no

## 2017-12-26 ENCOUNTER — Ambulatory Visit: Payer: Self-pay | Admitting: Genetic Counselor

## 2017-12-26 ENCOUNTER — Telehealth: Payer: Self-pay | Admitting: Genetic Counselor

## 2017-12-26 ENCOUNTER — Encounter: Payer: Self-pay | Admitting: Genetic Counselor

## 2017-12-26 DIAGNOSIS — Z1501 Genetic susceptibility to malignant neoplasm of breast: Secondary | ICD-10-CM | POA: Insufficient documentation

## 2017-12-26 DIAGNOSIS — Z1503 Genetic susceptibility to malignant neoplasm of prostate: Secondary | ICD-10-CM

## 2017-12-26 DIAGNOSIS — Z1509 Genetic susceptibility to other malignant neoplasm: Secondary | ICD-10-CM

## 2017-12-26 DIAGNOSIS — Z1589 Genetic susceptibility to other disease: Secondary | ICD-10-CM

## 2017-12-26 DIAGNOSIS — Z803 Family history of malignant neoplasm of breast: Secondary | ICD-10-CM

## 2017-12-26 DIAGNOSIS — D122 Benign neoplasm of ascending colon: Secondary | ICD-10-CM

## 2017-12-26 DIAGNOSIS — Z1379 Encounter for other screening for genetic and chromosomal anomalies: Secondary | ICD-10-CM

## 2017-12-26 NOTE — Telephone Encounter (Signed)
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  This mutation increases the risk for colon, prostate and fe/male breast cancer.  His daughter has a 50% risk for having inherited this mutation.  He will discuss this result with his wife and daughter.

## 2017-12-26 NOTE — Progress Notes (Addendum)
GENETIC TEST RESULTS   Patient Name: Jesse Dunlap Patient Age: 52 y.o. Encounter Date: 12/26/2017  Referring Provider: Brandon Melnick, MD    Mr. Sherrow was seen in the [facility] clinic on December 16, 2017 due to a family history of cancer and concern regarding a hereditary predisposition to cancer in the family. Please refer to the prior Genetics clinic note for more information regarding Mr. Jaco medical and family histories and our assessment at the time.   FAMILY HISTORY:  We obtained a detailed, 4-generation family history.  Significant diagnoses are listed below: Family History  Problem Relation Age of Onset  . Diabetes Mother   . Breast cancer Mother 51       CHEK2 pos  . Diabetes Father   . Hypertension Father   . Skin cancer Father   . Breast cancer Sister 86  . Alzheimer's disease Maternal Grandmother   . Breast cancer Maternal Grandmother        dx over 35  . Prostate cancer Maternal Grandfather        dx in his 65s-70s  . Lung cancer Paternal Grandfather   . Prostate cancer Other        PGF's father    The patient has one daughter who is cancer free.  He had one sister who developed breast cancer at 57 and died at 9.  She has two daughters who are cancer free. Both parents are living.  The patient's father has had SCC skin cancer.  He has one sister who is cancer free.  The paternal grandparents are deceased.  His grandmother died at 37 and the grandfather died of lung cancer.  The paternal grandfather's father had prostate cancer.  The patient's mother had breast cancer at 10.  She was recently tested and found to carry a CHEK2 mutation.  She had two brothers one who died at 83 and the other who is living and cancer free.  Both maternal grandparents had cancer.  The grandmother had breast cancer and the grandfather had prostate cancer.  Patient's maternal ancestors are of [place] descent, and paternal ancestors are of [place] descent. There is no  reported Ashkenazi Jewish ancestry. There is no known consanguinity.  GENETIC TESTING:   At the time of Mr. Hilbert visit, we recommended he pursue genetic testing for the specific CHEK2 mutation that was identified in the family. The genetic testing December 24, 2017 through the Familial Variant testing offered by [facility] identified a single, heterozygous pathogenic gene mutation called CHEK2, c.1100delC.   DISCUSSION: CHEK2 mutations have been found to be associated with an increased risk of breast and other cancers. The estimated cancer risks vary widely and may be influenced by family history. Women with a CHEK2 deleterious mutation have approximately a 24% (no family history of breast cancer) to 48% (strong family history of breast cancer) lifetime risk of breast cancer and up to a 25% risk of a second breast cancer. Men may have an increased risk for male breast cancer of about 1%. Men and women may have an increased risk of colon cancer (~10% lifetime risk). According to the NCCN guidelines, individuals with CHEK2 mutations should consider breast MRI's as a part of regular breast cancer screening, and depending on family history could consider a risk-reducing mastectomy.  Breast cancer screening should begin, for women, at age 54 or 26 years younger than the earliest age of onset.  Colon cancer screening should begin at age 46 and continue every 5 years or based  on polyp number.    CANCER SCREENING: Below are the NCCN Practice guidelines for women and men.  However, because the breast cancer risks for women and prostate cancer risks for men may be similar, it is appropriate to consider these high risk management recommendations.  Breast Management Options We reviewed the NCCN practice guidelines (v3.2019) for breast management for women at an increased risk of breast cancer because of CHEK2 mutations:   1. Breast awareness (which may include periodic, consistent breast self exam) starting at  age 54.  2. Breast screening:  . Starting at age 44, annual mammogram and breast MRI screening, or starting 10 years younger than the earliest age of onset.   Male Breast and Prostate Management Options We reviewed the NCCN practice guidelines (v3.2019) for male breast and prostate management for men at high risk of male breast and prostate cancer because of BRCA1 or BRCA2 mutations:  1. Breast self-exam training and education starting at age 42.  2. Clinical breast exam, every 6-12 months, starting at age 47 years  42. Consider baseline screening mammogram at age 60 with annual mammograms if gynecomastia or parenchymal/glandular breast density on baseline study.  4. Consider prostate cancer screening starting at age 40 years.   Colon Cancer Management:  Men and women with a deleterious CHEK2 mutation may have up to a 10% lifetime risk for colon cancer. The following is recommended for individuals with a CHEK2 mutation:  Personal history of colon cancer  Follow instructions provided by your physician based on your personal history.  Do not have a personal history of colon cancer but have a parent/sibling/child with colon cancer: Colonoscopy every 5 years starting at age 7 or 30 years younger than the earliest age of onset, whichever is younger.  Do not have a personal history of colon cancer but do not have a parent/sibling/child with colon cancer: Colonoscopy every 5 years starting at age 70.   FAMILY MEMBERS: It is important that all of Mr. Carley relatives (both men and women) know of the presence of this gene mutation.  Women need to know that they may be at increased risk for breast and colon cancers.  Men are at slightly increased risk for breast, prostate and colon cancers.  Genetic testing can sort out who in your family is at risk and who is not.  We would be happy to help meet with and coordinate genetic testing for any relative that is interested.  Mr. Tadros daughter is  at 50% risk to have inherited the mutation found in him. We recommend she have genetic testing for this same mutation, as identifying the presence of this mutation would allow them to also take advantage of risk-reducing measures.   Our knowledge of cancer risks related to CHEK2 mutations will continue to evolve. We recommended that Mr. Gedney follow up with the genetics clinic annually so we can provide him with the most current information about CHEK2 and cancer risk, as well as with any changes to his family history (new cancer diagnoses, genetic test results).  Our contact number was provided. Mr. Sahli questions were answered to his satisfaction, and he knows he is welcome to call us at anytime with additional questions or concerns.   Roma Kayser, MS, [facility] Certified Genetic Counselor [email or web address]

## 2018-01-15 ENCOUNTER — Ambulatory Visit: Payer: 59 | Admitting: Family Medicine

## 2018-01-15 ENCOUNTER — Encounter: Payer: Self-pay | Admitting: Family Medicine

## 2018-01-15 VITALS — BP 156/94 | HR 79 | Temp 98.7°F | Wt 206.3 lb

## 2018-01-15 DIAGNOSIS — J069 Acute upper respiratory infection, unspecified: Secondary | ICD-10-CM | POA: Diagnosis not present

## 2018-01-15 MED ORDER — AZITHROMYCIN 250 MG PO TABS
ORAL_TABLET | ORAL | 0 refills | Status: DC
Start: 2018-01-15 — End: 2018-04-22

## 2018-01-15 MED ORDER — PREDNISONE 20 MG PO TABS: 40.0000 mg | ORAL_TABLET | Freq: Every day | ORAL | 0 refills | Status: DC

## 2018-01-15 NOTE — Progress Notes (Signed)
   BP (!) 156/94 (BP Location: Right Arm, Patient Position: Sitting, Cuff Size: Normal)   Pulse 79   Temp 98.7 F (37.1 C) (Tympanic)   Wt 206 lb 4.8 oz (93.6 kg)   SpO2 98%   BMI 30.47 kg/m    Subjective:    Patient ID: Jesse Dunlap, male    DOB: 26-Jun-1966, 52 y.o.   MRN: [id number]  HPI: Jesse Dunlap is a 52 y.o. male  Chief Complaint  Patient presents with  . Cough    Patient was seen by Malachy Mood 12/13/17. Got a little better, then came back. Pt states Malachy Mood stated something about Pneumonia.   Marland Kitchen Hoarse   1-2 weeks of sore throat, fatigue, productive cough, hoarseness. Has been sick off and on for over a month now. Taking nyquil with minimal relief. Denies fever, chills, body aches, CP, SOB. Lots of sick contacts.   Relevant past medical, surgical, family and social history reviewed and updated as indicated. Interim medical history since our last visit reviewed. Allergies and medications reviewed and updated.  Review of Systems  Per HPI unless specifically indicated above     Objective:    BP (!) 156/94 (BP Location: Right Arm, Patient Position: Sitting, Cuff Size: Normal)   Pulse 79   Temp 98.7 F (37.1 C) (Tympanic)   Wt 206 lb 4.8 oz (93.6 kg)   SpO2 98%   BMI 30.47 kg/m   Wt Readings from Last 3 Encounters:  01/15/18 206 lb 4.8 oz (93.6 kg)  12/13/17 206 lb 3.2 oz (93.5 kg)  08/13/17 199 lb 3.2 oz (90.4 kg)    Physical Exam  Constitutional: He is oriented to person, place, and time. He appears well-developed and well-nourished. No distress.  HENT:  Head: Atraumatic.  Right Ear: External ear normal.  Left Ear: External ear normal.  Oropharynx erythematous and edematous, nasal mucosa injected with thick drainage present  Eyes: Conjunctivae are normal. Pupils are equal, round, and reactive to light.  Neck: Normal range of motion. Neck supple.  Cardiovascular: Normal rate and normal heart sounds.  Pulmonary/Chest: Effort normal. He has wheezes (mild).    Musculoskeletal: Normal range of motion.  Neurological: He is alert and oriented to person, place, and time.  Skin: Skin is warm and dry.  Psychiatric: He has a normal mood and affect. His behavior is normal.  Nursing note and vitals reviewed.   Results for orders placed or performed in visit on 12/13/17  Veritor Flu A/B Waived  Result Value Ref Range   Influenza A Negative Negative   Influenza B Negative Negative      Assessment & Plan:   Problem List Items Addressed This Visit    None    Visit Diagnoses    Upper respiratory tract infection, unspecified type    -  Primary   Will treat with zpack and prednisone. Discussed OTC remedies and supportive care. F/u if worsening or no improvement   Relevant Medications   azithromycin (ZITHROMAX) 250 MG tablet       Follow up plan: Return if symptoms worsen or fail to improve.

## 2018-01-18 NOTE — Patient Instructions (Signed)
Follow up as needed

## 2018-02-06 ENCOUNTER — Other Ambulatory Visit: Payer: Self-pay | Admitting: Family Medicine

## 2018-02-06 DIAGNOSIS — I10 Essential (primary) hypertension: Secondary | ICD-10-CM

## 2018-03-10 ENCOUNTER — Other Ambulatory Visit: Payer: Self-pay | Admitting: Family Medicine

## 2018-03-10 DIAGNOSIS — I10 Essential (primary) hypertension: Secondary | ICD-10-CM

## 2018-03-10 NOTE — Telephone Encounter (Signed)
Your patient 

## 2018-04-13 ENCOUNTER — Other Ambulatory Visit: Payer: Self-pay | Admitting: Family Medicine

## 2018-04-13 DIAGNOSIS — I10 Essential (primary) hypertension: Secondary | ICD-10-CM

## 2018-04-22 ENCOUNTER — Encounter: Payer: Self-pay | Admitting: Family Medicine

## 2018-04-22 ENCOUNTER — Ambulatory Visit (INDEPENDENT_AMBULATORY_CARE_PROVIDER_SITE_OTHER): Payer: 59 | Admitting: Family Medicine

## 2018-04-22 VITALS — BP 160/103 | HR 73 | Ht 69.69 in | Wt 207.0 lb

## 2018-04-22 DIAGNOSIS — J189 Pneumonia, unspecified organism: Secondary | ICD-10-CM | POA: Insufficient documentation

## 2018-04-22 DIAGNOSIS — Z125 Encounter for screening for malignant neoplasm of prostate: Secondary | ICD-10-CM

## 2018-04-22 DIAGNOSIS — Z0000 Encounter for general adult medical examination without abnormal findings: Secondary | ICD-10-CM

## 2018-04-22 DIAGNOSIS — Z1329 Encounter for screening for other suspected endocrine disorder: Secondary | ICD-10-CM

## 2018-04-22 DIAGNOSIS — Z1589 Genetic susceptibility to other disease: Secondary | ICD-10-CM

## 2018-04-22 DIAGNOSIS — Z1509 Genetic susceptibility to other malignant neoplasm: Secondary | ICD-10-CM

## 2018-04-22 DIAGNOSIS — Z1501 Genetic susceptibility to malignant neoplasm of breast: Secondary | ICD-10-CM

## 2018-04-22 DIAGNOSIS — Z13220 Encounter for screening for lipoid disorders: Secondary | ICD-10-CM | POA: Diagnosis not present

## 2018-04-22 DIAGNOSIS — E291 Testicular hypofunction: Secondary | ICD-10-CM | POA: Diagnosis not present

## 2018-04-22 DIAGNOSIS — Z114 Encounter for screening for human immunodeficiency virus [HIV]: Secondary | ICD-10-CM | POA: Diagnosis not present

## 2018-04-22 DIAGNOSIS — Z1159 Encounter for screening for other viral diseases: Secondary | ICD-10-CM

## 2018-04-22 DIAGNOSIS — I10 Essential (primary) hypertension: Secondary | ICD-10-CM | POA: Diagnosis not present

## 2018-04-22 DIAGNOSIS — Z0001 Encounter for general adult medical examination with abnormal findings: Secondary | ICD-10-CM | POA: Diagnosis not present

## 2018-04-22 DIAGNOSIS — Z1503 Genetic susceptibility to malignant neoplasm of prostate: Secondary | ICD-10-CM

## 2018-04-22 DIAGNOSIS — R7309 Other abnormal glucose: Secondary | ICD-10-CM | POA: Diagnosis not present

## 2018-04-22 DIAGNOSIS — K529 Noninfective gastroenteritis and colitis, unspecified: Secondary | ICD-10-CM | POA: Insufficient documentation

## 2018-04-22 DIAGNOSIS — M545 Low back pain, unspecified: Secondary | ICD-10-CM | POA: Insufficient documentation

## 2018-04-22 DIAGNOSIS — E7800 Pure hypercholesterolemia, unspecified: Secondary | ICD-10-CM | POA: Insufficient documentation

## 2018-04-22 DIAGNOSIS — G4733 Obstructive sleep apnea (adult) (pediatric): Secondary | ICD-10-CM | POA: Insufficient documentation

## 2018-04-22 DIAGNOSIS — F321 Major depressive disorder, single episode, moderate: Secondary | ICD-10-CM | POA: Diagnosis not present

## 2018-04-22 DIAGNOSIS — G4730 Sleep apnea, unspecified: Secondary | ICD-10-CM | POA: Insufficient documentation

## 2018-04-22 LAB — URINALYSIS, ROUTINE W REFLEX MICROSCOPIC
Bilirubin, UA: NEGATIVE
Glucose, UA: NEGATIVE
Ketones, UA: NEGATIVE
LEUKOCYTES UA: NEGATIVE
NITRITE UA: NEGATIVE
PH UA: 5.5 (ref 5.0–7.5)
Protein, UA: NEGATIVE
RBC, UA: NEGATIVE
Specific Gravity, UA: 1.02 (ref 1.005–1.030)
Urobilinogen, Ur: 0.2 mg/dL (ref 0.2–1.0)

## 2018-04-22 LAB — BAYER DCA HB A1C WAIVED: HB A1C (BAYER DCA - WAIVED): 5.5 % (ref <7.0)

## 2018-04-22 MED ORDER — AMLODIPINE BESYLATE 5 MG PO TABS: 5.0000 mg | ORAL_TABLET | Freq: Every day | ORAL | 3 refills | Status: DC

## 2018-04-22 MED ORDER — FLUOXETINE HCL 40 MG PO CAPS: 40.0000 mg | ORAL_CAPSULE | Freq: Every day | ORAL | 4 refills | Status: DC

## 2018-04-22 MED ORDER — BENAZEPRIL HCL 40 MG PO TABS: 40.0000 mg | ORAL_TABLET | Freq: Every day | ORAL | 4 refills | Status: DC

## 2018-04-22 MED ORDER — TRAZODONE HCL 50 MG PO TABS: 25.0000 mg | ORAL_TABLET | Freq: Every evening | ORAL | 4 refills | Status: DC | PRN

## 2018-04-22 NOTE — Assessment & Plan Note (Signed)
Discussed hypertension poor control on chart review intermittently elevated blood pressure readings. Will add amlodipine to patient's regimen discussed continued lifestyle and medications. Recheck 1 month

## 2018-04-22 NOTE — Assessment & Plan Note (Signed)
The current medical regimen is effective;  continue present plan and medications.  

## 2018-04-22 NOTE — Assessment & Plan Note (Signed)
We will continue routine prostate and colonoscopy

## 2018-04-22 NOTE — Progress Notes (Signed)
BP (!) 160/103   Pulse 73   Ht 5' 9.69" (1.77 m)   Wt 207 lb (93.9 kg)   SpO2 98%   BMI 29.97 kg/m    Subjective:    Patient ID: Jesse Dunlap, male    DOB: [birth year], 52 y.o.   MRN: [id number]  HPI: Jesse Dunlap is a 52 y.o. male  Chief Complaint  Patient presents with  . Annual Exam  . Depression  Patient recheck doing well for physical exam no real complaints.  Depression doing okay's still having some issues with stress but all in all depression and energy are okay. Patient with no daytime drowsiness not using CPAP wife does not report any excessive not breathing at night. Blood pressure taking medications without problems not checking blood pressures at home.   Relevant past medical, surgical, family and social history reviewed and updated as indicated. Interim medical history since our last visit reviewed. Allergies and medications reviewed and updated.  Review of Systems  Constitutional: Negative.   HENT: Negative.   Eyes: Negative.   Respiratory: Negative.   Cardiovascular: Negative.   Gastrointestinal: Negative.   Endocrine: Negative.   Genitourinary: Negative.   Musculoskeletal: Negative.   Skin: Negative.   Allergic/Immunologic: Negative.   Neurological: Negative.   Hematological: Negative.   Psychiatric/Behavioral: Negative.     Per HPI unless specifically indicated above     Objective:    BP (!) 160/103   Pulse 73   Ht 5' 9.69" (1.77 m)   Wt 207 lb (93.9 kg)   SpO2 98%   BMI 29.97 kg/m   Wt Readings from Last 3 Encounters:  04/22/18 207 lb (93.9 kg)  01/15/18 206 lb 4.8 oz (93.6 kg)  12/13/17 206 lb 3.2 oz (93.5 kg)    Physical Exam  Constitutional: He is oriented to person, place, and time. He appears well-developed and well-nourished.  HENT:  Head: Normocephalic and atraumatic.  Right Ear: External ear normal.  Left Ear: External ear normal.  Eyes: Pupils are equal, round, and reactive to light. Conjunctivae and EOM are normal.    Neck: Normal range of motion. Neck supple.  Cardiovascular: Normal rate, regular rhythm, normal heart sounds and intact distal pulses.  Pulmonary/Chest: Effort normal and breath sounds normal.  Abdominal: Soft. Bowel sounds are normal. There is no splenomegaly or hepatomegaly.  Genitourinary: Rectum normal, prostate normal and penis normal.  Musculoskeletal: Normal range of motion.  Neurological: He is alert and oriented to person, place, and time. He has normal reflexes.  Skin: No rash noted. No erythema.  Psychiatric: He has a normal mood and affect. His behavior is normal. Judgment and thought content normal.    Results for orders placed or performed in visit on 12/13/17  Veritor Flu A/B Waived  Result Value Ref Range   Influenza A Negative Negative   Influenza B Negative Negative      Assessment & Plan:   Problem List Items Addressed This Visit      Cardiovascular and Mediastinum   Hypertension - Primary    Discussed hypertension poor control on chart review intermittently elevated blood pressure readings. Will add amlodipine to patient's regimen discussed continued lifestyle and medications. Recheck 1 month      Relevant Medications   benazepril (LOTENSIN) 40 MG tablet   amLODipine (NORVASC) 5 MG tablet   Other Relevant Orders   Lipid panel   Bayer DCA Hb A1c Waived     Endocrine   Hypogonadism in male   Relevant  Orders   PSA     Other   Depression, major, single episode, moderate ([place])    The current medical regimen is effective;  continue present plan and medications.       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    We will continue routine prostate and colonoscopy       Other Visit Diagnoses    Annual physical exam       Screening cholesterol level       Relevant Orders   CBC with Differential/Platelet   Comprehensive metabolic panel    Urinalysis, Routine w reflex microscopic   Lipid panel   Thyroid disorder screening       Relevant Orders   TSH   Prostate cancer screening       Relevant Orders   PSA   Encounter for screening for HIV       Relevant Orders   HIV antibody (with reflex)   Need for hepatitis C screening test       Relevant Orders   Hepatitis C Antibody   Elevated glucose       Relevant Orders   Bayer DCA Hb A1c Waived       Follow up plan: Return in about 1 month (around 05/20/2018) for BP check.

## 2018-04-23 LAB — CBC WITH DIFFERENTIAL/PLATELET
BASOS: 1 %
Basophils Absolute: 0 10*3/uL (ref 0.0–0.2)
EOS (ABSOLUTE): 0.1 10*3/uL (ref 0.0–0.4)
EOS: 3 %
HEMATOCRIT: 42 % (ref 37.5–51.0)
HEMOGLOBIN: 14.6 g/dL (ref 13.0–17.7)
IMMATURE GRANS (ABS): 0 10*3/uL (ref 0.0–0.1)
IMMATURE GRANULOCYTES: 0 %
LYMPHS: 32 %
Lymphocytes Absolute: 1.2 10*3/uL (ref 0.7–3.1)
MCH: 31.3 pg (ref 26.6–33.0)
MCHC: 34.8 g/dL (ref 31.5–35.7)
MCV: 90 fL (ref 79–97)
MONOCYTES: 8 %
Monocytes Absolute: 0.3 10*3/uL (ref 0.1–0.9)
NEUTROS ABS: 2.1 10*3/uL (ref 1.4–7.0)
NEUTROS PCT: 56 %
PLATELETS: 170 10*3/uL (ref 150–450)
RBC: 4.66 x10E6/uL (ref 4.14–5.80)
RDW: 13.9 % (ref 12.3–15.4)
WBC: 3.8 10*3/uL (ref 3.4–10.8)

## 2018-04-23 LAB — COMPREHENSIVE METABOLIC PANEL
A/G RATIO: 1.8 (ref 1.2–2.2)
ALBUMIN: 4.6 g/dL (ref 3.5–5.5)
ALT: 16 IU/L (ref 0–44)
AST: 16 IU/L (ref 0–40)
Alkaline Phosphatase: 58 IU/L (ref 39–117)
BILIRUBIN TOTAL: 0.7 mg/dL (ref 0.0–1.2)
BUN / CREAT RATIO: 13 (ref 9–20)
BUN: 12 mg/dL (ref 6–24)
CALCIUM: 9.2 mg/dL (ref 8.7–10.2)
CO2: 23 mmol/L (ref 20–29)
Chloride: 103 mmol/L (ref 96–106)
Creatinine, Ser: 0.94 mg/dL (ref 0.76–1.27)
GFR calc non Af Amer: 92 mL/min/{1.73_m2} (ref >59)
GFR, EST AFRICAN AMERICAN: 107 mL/min/{1.73_m2} (ref >59)
Globulin, Total: 2.6 g/dL (ref 1.5–4.5)
Glucose: 124 mg/dL — ABNORMAL HIGH (ref 65–99)
POTASSIUM: 4 mmol/L (ref 3.5–5.2)
Sodium: 140 mmol/L (ref 134–144)
TOTAL PROTEIN: 7.2 g/dL (ref 6.0–8.5)

## 2018-04-23 LAB — LIPID PANEL
CHOLESTEROL TOTAL: 225 mg/dL — AB (ref 100–199)
Chol/HDL Ratio: 3.2 ratio (ref 0.0–5.0)
HDL: 71 mg/dL (ref >39)
LDL CALC: 137 mg/dL — AB (ref 0–99)
TRIGLYCERIDES: 83 mg/dL (ref 0–149)
VLDL CHOLESTEROL CAL: 17 mg/dL (ref 5–40)

## 2018-04-23 LAB — PSA: Prostate Specific Ag, Serum: 3.3 ng/mL (ref 0.0–4.0)

## 2018-04-23 LAB — HEPATITIS C ANTIBODY: Hep C Virus Ab: <0.1 s/co ratio (ref 0.0–0.9)

## 2018-04-23 LAB — HIV ANTIBODY (ROUTINE TESTING W REFLEX): HIV SCREEN 4TH GENERATION: NONREACTIVE

## 2018-04-23 LAB — TSH: TSH: 1.21 u[IU]/mL (ref 0.450–4.500)

## 2018-05-13 ENCOUNTER — Telehealth: Payer: Self-pay | Admitting: Family Medicine

## 2018-05-13 NOTE — Telephone Encounter (Signed)
-----   Message from Jesse Dunlap, [place] sent at 05/13/2018 11:30 AM EDT ----- Patient was transferred to provider for telephone conversation.

## 2018-05-13 NOTE — Telephone Encounter (Signed)
Phone call Discussed with patient elevated cholesterol patient will do better with diet exercise nutrition and will recheck lipid panel next visit.

## 2018-05-29 ENCOUNTER — Encounter: Payer: Self-pay | Admitting: Family Medicine

## 2018-05-29 ENCOUNTER — Ambulatory Visit (INDEPENDENT_AMBULATORY_CARE_PROVIDER_SITE_OTHER): Payer: 59 | Admitting: Family Medicine

## 2018-05-29 VITALS — BP 136/80 | HR 62 | Temp 98.8°F | Ht 69.5 in | Wt 209.4 lb

## 2018-05-29 DIAGNOSIS — E7800 Pure hypercholesterolemia, unspecified: Secondary | ICD-10-CM | POA: Diagnosis not present

## 2018-05-29 DIAGNOSIS — I10 Essential (primary) hypertension: Secondary | ICD-10-CM

## 2018-05-29 LAB — LP+ALT+AST PICCOLO, WAIVED
ALT (SGPT) PICCOLO, WAIVED: 24 U/L (ref 10–47)
AST (SGOT) PICCOLO, WAIVED: 21 U/L (ref 11–38)
Chol/HDL Ratio Piccolo,Waive: 3 mg/dL
Cholesterol Piccolo, Waived: 214 mg/dL — ABNORMAL HIGH (ref <200)
HDL Chol Piccolo, Waived: 73 mg/dL (ref >59)
LDL Chol Calc Piccolo Waived: 91 mg/dL (ref <100)
Triglycerides Piccolo,Waived: 253 mg/dL — ABNORMAL HIGH (ref <150)
VLDL CHOL CALC PICCOLO,WAIVE: 51 mg/dL — AB (ref <30)

## 2018-05-29 MED ORDER — AMLODIPINE BESYLATE 5 MG PO TABS: 5.0000 mg | ORAL_TABLET | Freq: Every day | ORAL | 4 refills | Status: DC

## 2018-05-29 NOTE — Assessment & Plan Note (Signed)
Now diet controlled and doing well encourage patient to maintain good control.

## 2018-05-29 NOTE — Assessment & Plan Note (Signed)
The current medical regimen is effective;  continue present plan and medications.  

## 2018-05-29 NOTE — Progress Notes (Signed)
BP 136/80 (BP Location: Left Arm)   Pulse 62   Temp 98.8 F (37.1 C) (Oral)   Ht 5' 9.5" (1.765 m)   Wt 209 lb 6.4 oz (95 kg)   SpO2 98%   BMI 30.48 kg/m    Subjective:    Patient ID: Jesse Dunlap, male    DOB: 1966-05-02, 52 y.o.   MRN: [id number]  HPI: Jesse Dunlap is a 52 y.o. male  Chief Complaint  Patient presents with  . Hypertension  Patient hypertension recheck taking amlodipine without problems or issues no side effects. Blood pressure today initially is high sitting good. On chart review patient also with elevated cholesterol to do better with diet nutrition  Relevant past medical, surgical, family and social history reviewed and updated as indicated. Interim medical history since our last visit reviewed. Allergies and medications reviewed and updated.  Review of Systems  Constitutional: Negative.   Respiratory: Negative.   Cardiovascular: Negative.     Per HPI unless specifically indicated above     Objective:    BP 136/80 (BP Location: Left Arm)   Pulse 62   Temp 98.8 F (37.1 C) (Oral)   Ht 5' 9.5" (1.765 m)   Wt 209 lb 6.4 oz (95 kg)   SpO2 98%   BMI 30.48 kg/m   Wt Readings from Last 3 Encounters:  05/29/18 209 lb 6.4 oz (95 kg)  04/22/18 207 lb (93.9 kg)  01/15/18 206 lb 4.8 oz (93.6 kg)    Physical Exam  Constitutional: He is oriented to person, place, and time. He appears well-developed and well-nourished.  HENT:  Head: Normocephalic and atraumatic.  Eyes: Conjunctivae and EOM are normal.  Neck: Normal range of motion.  Cardiovascular: Normal rate, regular rhythm and normal heart sounds.  Pulmonary/Chest: Effort normal and breath sounds normal.  Musculoskeletal: Normal range of motion.  Neurological: He is alert and oriented to person, place, and time.  Skin: No erythema.  Psychiatric: He has a normal mood and affect. His behavior is normal. Judgment and thought content normal.    Results for orders placed or performed in  visit on 04/22/18  CBC with Differential/Platelet  Result Value Ref Range   WBC 3.8 3.4 - 10.8 x10E3/uL   RBC 4.66 4.14 - 5.80 x10E6/uL   Hemoglobin 14.6 13.0 - 17.7 g/dL   Hematocrit 42.0 37.5 - 51.0 %   MCV 90 79 - 97 fL   MCH 31.3 26.6 - 33.0 pg   MCHC 34.8 31.5 - 35.7 g/dL   RDW 13.9 12.3 - 15.4 %   Platelets 170 150 - 450 x10E3/uL   Neutrophils 56 Not Estab. %   Lymphs 32 Not Estab. %   Monocytes 8 Not Estab. %   Eos 3 Not Estab. %   Basos 1 Not Estab. %   Neutrophils Absolute 2.1 1.4 - 7.0 x10E3/uL   Lymphocytes Absolute 1.2 0.7 - 3.1 x10E3/uL   Monocytes Absolute 0.3 0.1 - 0.9 x10E3/uL   EOS (ABSOLUTE) 0.1 0.0 - 0.4 x10E3/uL   Basophils Absolute 0.0 0.0 - 0.2 x10E3/uL   Immature Granulocytes 0 Not Estab. %   Immature Grans (Abs) 0.0 0.0 - 0.1 x10E3/uL  Comprehensive metabolic panel  Result Value Ref Range   Glucose 124 (H) 65 - 99 mg/dL   BUN 12 6 - 24 mg/dL   Creatinine, Ser 0.94 0.76 - 1.27 mg/dL   GFR calc non Af Amer 92 >59 mL/min/1.73   GFR calc Af Amer 107 >59 mL/min/1.73  BUN/Creatinine Ratio 13 9 - 20   Sodium 140 134 - 144 mmol/L   Potassium 4.0 3.5 - 5.2 mmol/L   Chloride 103 96 - 106 mmol/L   CO2 23 20 - 29 mmol/L   Calcium 9.2 8.7 - 10.2 mg/dL   Total Protein 7.2 6.0 - 8.5 g/dL   Albumin 4.6 3.5 - 5.5 g/dL   Globulin, Total 2.6 1.5 - 4.5 g/dL   Albumin/Globulin Ratio 1.8 1.2 - 2.2   Bilirubin Total 0.7 0.0 - 1.2 mg/dL   Alkaline Phosphatase 58 39 - 117 IU/L   AST 16 0 - 40 IU/L   ALT 16 0 - 44 IU/L  PSA  Result Value Ref Range   Prostate Specific Ag, Serum 3.3 0.0 - 4.0 ng/mL  TSH  Result Value Ref Range   TSH 1.210 0.450 - 4.500 uIU/mL  Urinalysis, Routine w reflex microscopic  Result Value Ref Range   Specific Gravity, UA 1.020 1.005 - 1.030   pH, UA 5.5 5.0 - 7.5   Color, UA Yellow Yellow   Appearance Ur Clear Clear   Leukocytes, UA Negative Negative   Protein, UA Negative Negative/Trace   Glucose, UA Negative Negative   Ketones, UA  Negative Negative   RBC, UA Negative Negative   Bilirubin, UA Negative Negative   Urobilinogen, Ur 0.2 0.2 - 1.0 mg/dL   Nitrite, UA Negative Negative  HIV antibody (with reflex)  Result Value Ref Range   HIV Screen 4th Generation wRfx Non Reactive Non Reactive  Lipid panel  Result Value Ref Range   Cholesterol, Total 225 (H) 100 - 199 mg/dL   Triglycerides 83 0 - 149 mg/dL   HDL 71 >39 mg/dL   VLDL Cholesterol Cal 17 5 - 40 mg/dL   LDL Calculated 137 (H) 0 - 99 mg/dL   Chol/HDL Ratio 3.2 0.0 - 5.0 ratio  Hepatitis C Antibody  Result Value Ref Range   Hep C Virus Ab <0.1 0.0 - 0.9 s/co ratio  Bayer DCA Hb A1c Waived  Result Value Ref Range   HB A1C (BAYER DCA - WAIVED) 5.5 <7.0 %      Assessment & Plan:   Problem List Items Addressed This Visit      Cardiovascular and Mediastinum   Hypertension    The current medical regimen is effective;  continue present plan and medications.       Relevant Medications   amLODipine (NORVASC) 5 MG tablet     Other   Hypercholesteremia - Primary    Now diet controlled and doing well encourage patient to maintain good control.      Relevant Medications   amLODipine (NORVASC) 5 MG tablet   Other Relevant Orders   LP+ALT+AST Piccolo, Waived       Follow up plan: Return in about 6 months (around 11/29/2018) for BMP.

## 2018-06-14 ENCOUNTER — Other Ambulatory Visit: Payer: Self-pay | Admitting: Family Medicine

## 2018-06-14 DIAGNOSIS — I10 Essential (primary) hypertension: Secondary | ICD-10-CM

## 2018-08-29 DIAGNOSIS — Z23 Encounter for immunization: Secondary | ICD-10-CM | POA: Diagnosis not present

## 2018-10-21 ENCOUNTER — Ambulatory Visit: Payer: 59 | Admitting: Family Medicine

## 2018-10-21 ENCOUNTER — Encounter: Payer: Self-pay | Admitting: Family Medicine

## 2018-10-21 ENCOUNTER — Telehealth: Payer: Self-pay

## 2018-10-21 VITALS — BP 167/99 | HR 69 | Temp 98.8°F | Wt 218.0 lb

## 2018-10-21 DIAGNOSIS — K580 Irritable bowel syndrome with diarrhea: Secondary | ICD-10-CM

## 2018-10-21 DIAGNOSIS — K589 Irritable bowel syndrome without diarrhea: Secondary | ICD-10-CM | POA: Insufficient documentation

## 2018-10-21 MED ORDER — RIFAXIMIN 550 MG PO TABS: 550.0000 mg | ORAL_TABLET | Freq: Three times a day (TID) | ORAL | 0 refills | Status: AC

## 2018-10-21 NOTE — Progress Notes (Signed)
BP (!) 167/99   Pulse 69   Temp 98.8 F (37.1 C) (Oral)   Wt 218 lb (98.9 kg)   SpO2 98%   BMI 31.73 kg/m    Subjective:    Patient ID: Jesse Dunlap, male    DOB: 1966-01-06, 52 y.o.   MRN: [id number]  HPI: Jesse Dunlap is a 52 y.o. male  Chief Complaint  Patient presents with  . Irritable Bowel Syndrome    BM urgency. 20 year suffer. Worsening.    Jesse Dunlap presents today for evaluation of chronic irritable bowel disease- diarrhea predominant. He notes that he has been having issues for about 20 years. He has had colonoscopies- due again in May 2020. He has taken hycosamine and increased fiber in the past. He continues to increase fiber.  He notes that in the past few months, his diarrhea and bowel urgency has gotten significantly worse. No blood in his stool. No dark stools. No weight loss. No nausea, no vomiting. + cramping and bloating. He notes that his diarrhea seems to be worse when he eats. That seems to be the time when he has the most issues. He notes that he often feels like he is not going to make it home after eating. No bowel incontinence. He has never been woken up with his bowel movements. He is otherwise feeling well with no other concerns or complaints at this time.   Relevant past medical, surgical, family and social history reviewed and updated as indicated. Interim medical history since our last visit reviewed. Allergies and medications reviewed and updated.  Review of Systems  Constitutional: Negative.   Respiratory: Negative.   Cardiovascular: Negative.   Gastrointestinal: Positive for abdominal pain and diarrhea. Negative for abdominal distention, anal bleeding, blood in stool, constipation, nausea, rectal pain and vomiting.       Urgency  Neurological: Negative.   Psychiatric/Behavioral: Negative.     Per HPI unless specifically indicated above     Objective:    BP (!) 167/99   Pulse 69   Temp 98.8 F (37.1 C) (Oral)   Wt 218 lb (98.9 kg)    SpO2 98%   BMI 31.73 kg/m   Wt Readings from Last 3 Encounters:  10/21/18 218 lb (98.9 kg)  05/29/18 209 lb 6.4 oz (95 kg)  04/22/18 207 lb (93.9 kg)    Physical Exam Vitals signs and nursing note reviewed.  Constitutional:      General: He is not in acute distress.    Appearance: Normal appearance. He is not ill-appearing, toxic-appearing or diaphoretic.  HENT:     Head: Normocephalic and atraumatic.     Right Ear: External ear normal.     Left Ear: External ear normal.     Nose: Nose normal.     Mouth/Throat:     Mouth: Mucous membranes are moist.     Pharynx: Oropharynx is clear.  Eyes:     General: No scleral icterus.       Right eye: No discharge.        Left eye: No discharge.     Extraocular Movements: Extraocular movements intact.     Conjunctiva/sclera: Conjunctivae normal.     Pupils: Pupils are equal, round, and reactive to light.  Neck:     Musculoskeletal: Normal range of motion and neck supple.  Cardiovascular:     Rate and Rhythm: Normal rate and regular rhythm.     Pulses: Normal pulses.     Heart sounds: Normal heart sounds.  No murmur. No friction rub. No gallop.   Pulmonary:     Effort: Pulmonary effort is normal. No respiratory distress.     Breath sounds: Normal breath sounds. No stridor. No wheezing, rhonchi or rales.  Chest:     Chest wall: No tenderness.  Abdominal:     General: Abdomen is flat. Bowel sounds are normal. There is no distension.     Palpations: Abdomen is soft. There is no mass.     Tenderness: There is no abdominal tenderness. There is no right CVA tenderness, left CVA tenderness, guarding or rebound.     Hernia: No hernia is present.  Musculoskeletal: Normal range of motion.  Skin:    General: Skin is warm and dry.     Capillary Refill: Capillary refill takes less than 2 seconds.     Coloration: Skin is not jaundiced or pale.     Findings: No bruising, erythema, lesion or rash.  Neurological:     General: No focal deficit  present.     Mental Status: He is alert and oriented to person, place, and time. Mental status is at baseline.  Psychiatric:        Mood and Affect: Mood normal.        Behavior: Behavior normal.        Thought Content: Thought content normal.        Judgment: Judgment normal.     Results for orders placed or performed in visit on 05/29/18  LP+ALT+AST Piccolo, Waived  Result Value Ref Range   ALT (SGPT) Piccolo, Waived 24 10 - 47 U/L   AST (SGOT) Piccolo, Waived 21 11 - 38 U/L   Cholesterol Piccolo, Waived 214 (H) <200 mg/dL   HDL Chol Piccolo, Waived 73 >59 mg/dL   Triglycerides Piccolo,Waived 253 (H) <150 mg/dL   Chol/HDL Ratio Piccolo,Waive 3.0 mg/dL   LDL Chol Calc Piccolo Waived 91 <100 mg/dL   VLDL Chol Calc Piccolo,Waive 51 (H) <30 mg/dL      Assessment & Plan:   Problem List Items Addressed This Visit    None       Follow up plan: No follow-ups on file.

## 2018-10-21 NOTE — Telephone Encounter (Signed)
Received Prior Authorization for patient's Xifaxan.  PA has been started, need notes signed before finishing PA.  Key: [id number]

## 2018-10-21 NOTE — Assessment & Plan Note (Signed)
Has been getting progressively worse. Has tried hycosamine and increased fiber in the past without significant benefit. We will treat him with xifaxan for 2 weeks for IBS-D. Recheck 4-6 weeks to see how his diarrhea is doing.

## 2018-10-21 NOTE — Patient Instructions (Signed)
[email or web address]

## 2018-10-22 NOTE — Telephone Encounter (Signed)
PA approved. Pharmacy notified 

## 2018-10-22 NOTE — Telephone Encounter (Signed)
PA sent

## 2018-11-07 ENCOUNTER — Encounter: Payer: Self-pay | Admitting: Unknown Physician Specialty

## 2018-11-07 ENCOUNTER — Ambulatory Visit
Admission: RE | Admit: 2018-11-07 | Discharge: 2018-11-07 | Disposition: A | Discharge status: Home / Self Care | Payer: 59 | Source: Ambulatory Visit | Attending: Unknown Physician Specialty | Admitting: Unknown Physician Specialty

## 2018-11-07 ENCOUNTER — Ambulatory Visit: Payer: 59 | Admitting: Unknown Physician Specialty

## 2018-11-07 VITALS — BP 135/88 | HR 83 | Temp 99.5°F | Wt 214.2 lb

## 2018-11-07 DIAGNOSIS — J069 Acute upper respiratory infection, unspecified: Secondary | ICD-10-CM

## 2018-11-07 DIAGNOSIS — R0689 Other abnormalities of breathing: Secondary | ICD-10-CM | POA: Insufficient documentation

## 2018-11-07 DIAGNOSIS — L0292 Furuncle, unspecified: Secondary | ICD-10-CM

## 2018-11-07 DIAGNOSIS — R05 Cough: Secondary | ICD-10-CM | POA: Diagnosis not present

## 2018-11-07 MED ORDER — DOXYCYCLINE HYCLATE 100 MG PO TABS: 100.0000 mg | ORAL_TABLET | Freq: Two times a day (BID) | ORAL | 0 refills | Status: DC

## 2018-11-07 NOTE — Progress Notes (Signed)
BP 135/88 (BP Location: Left Arm, Cuff Size: Large)   Pulse 83   Temp 99.5 F (37.5 C) (Oral)   Wt 214 lb 3.2 oz (97.2 kg)   SpO2 98%   BMI 31.18 kg/m    Subjective:    Patient ID: Jesse Dunlap, male    DOB: 1965-11-23, 53 y.o.   MRN: [id number]  HPI: Jesse Dunlap is a 53 y.o. male  Chief Complaint  Patient presents with  . Mass    pt states he has a lump in his throat and under right arm that he would like checked    URI   This is a new problem. The current episode started today. The problem has been unchanged. There has been no fever. Associated symptoms include congestion, coughing, rhinorrhea, a sore throat and swollen glands. Pertinent negatives include no abdominal pain, chest pain, diarrhea, dysuria, ear pain, headaches, joint pain, joint swelling, nausea, neck pain, plugged ear sensation, rash, sinus pain, sneezing, vomiting or wheezing. He has tried nothing for the symptoms.   CC today is a lump in his neck and under right arm.  Has had before and typically goes away.  This time wife is encouraging him to come in.    Relevant past medical, surgical, family and social history reviewed and updated as indicated. Interim medical history since our last visit reviewed. Allergies and medications reviewed and updated.  Review of Systems  HENT: Positive for congestion, rhinorrhea and sore throat. Negative for ear pain, sinus pain and sneezing.   Respiratory: Positive for cough. Negative for wheezing.   Cardiovascular: Negative for chest pain.  Gastrointestinal: Negative for abdominal pain, diarrhea, nausea and vomiting.  Genitourinary: Negative for dysuria.  Musculoskeletal: Negative for joint pain and neck pain.  Skin: Negative for rash.  Neurological: Negative for headaches.    Per HPI unless specifically indicated above     Objective:    BP 135/88 (BP Location: Left Arm, Cuff Size: Large)   Pulse 83   Temp 99.5 F (37.5 C) (Oral)   Wt 214 lb 3.2 oz (97.2 kg)    SpO2 98%   BMI 31.18 kg/m   Wt Readings from Last 3 Encounters:  11/07/18 214 lb 3.2 oz (97.2 kg)  10/21/18 218 lb (98.9 kg)  05/29/18 209 lb 6.4 oz (95 kg)    Physical Exam Vitals signs and nursing note reviewed.  Constitutional:      General: He is not in acute distress.    Appearance: Normal appearance. He is well-developed.  HENT:     Head: Normocephalic and atraumatic.     Right Ear: Tympanic membrane and ear canal normal.     Left Ear: Tympanic membrane and ear canal normal.     Nose: Rhinorrhea present.     Right Sinus: No maxillary sinus tenderness or frontal sinus tenderness.     Left Sinus: No maxillary sinus tenderness or frontal sinus tenderness.     Mouth/Throat:     Pharynx: Uvula midline.  Eyes:     General: Lids are normal. No scleral icterus.       Right eye: No discharge.        Left eye: No discharge.     Conjunctiva/sclera: Conjunctivae normal.  Neck:     Musculoskeletal: Neck supple.  Cardiovascular:     Rate and Rhythm: Normal rate and regular rhythm.     Heart sounds: Normal heart sounds.  Pulmonary:     Effort: Pulmonary effort is normal. No respiratory distress.  Breath sounds: No stridor. Examination of the right-lower field reveals rhonchi. Examination of the left-lower field reveals rhonchi. Rhonchi present.  Abdominal:     Palpations: There is no hepatomegaly or splenomegaly.  Musculoskeletal: Normal range of motion.  Skin:    General: Skin is warm and dry.     Coloration: Skin is not pale.     Findings: No rash.     Comments: Indurated area under right arm.    Neurological:     Mental Status: He is alert and oriented to person, place, and time.  Psychiatric:        Behavior: Behavior normal.        Thought Content: Thought content normal.        Judgment: Judgment normal.     Results for orders placed or performed in visit on 05/29/18  LP+ALT+AST Piccolo, Waived  Result Value Ref Range   ALT (SGPT) Piccolo, Waived 24 10 - 47  U/L   AST (SGOT) Piccolo, Waived 21 11 - 38 U/L   Cholesterol Piccolo, Waived 214 (H) <200 mg/dL   HDL Chol Piccolo, Waived 73 >59 mg/dL   Triglycerides Piccolo,Waived 253 (H) <150 mg/dL   Chol/HDL Ratio Piccolo,Waive 3.0 mg/dL   LDL Chol Calc Piccolo Waived 91 <100 mg/dL   VLDL Chol Calc Piccolo,Waive 51 (H) <30 mg/dL      Assessment & Plan:   Problem List Items Addressed This Visit    None    Visit Diagnoses    Boil    -  Primary   right arm.  Will rx Doxycycline for further management.     Viral upper respiratory tract infection       discussed supportive care with rest and fluids   Relevant Orders   DG Chest 2 View   Adventitious breath sounds       Will get chest x-ray.  Rx for Doxycycline   Relevant Orders   DG Chest 2 View       Follow up plan: Return if symptoms worsen or fail to improve.

## 2018-11-09 ENCOUNTER — Other Ambulatory Visit: Payer: Self-pay | Admitting: Family Medicine

## 2018-12-03 ENCOUNTER — Ambulatory Visit: Payer: 59 | Admitting: Family Medicine

## 2018-12-03 ENCOUNTER — Encounter: Payer: Self-pay | Admitting: Family Medicine

## 2018-12-03 VITALS — BP 153/87 | HR 78 | Temp 98.0°F | Ht 69.0 in | Wt 218.0 lb

## 2018-12-03 DIAGNOSIS — F321 Major depressive disorder, single episode, moderate: Secondary | ICD-10-CM

## 2018-12-03 DIAGNOSIS — I10 Essential (primary) hypertension: Secondary | ICD-10-CM | POA: Diagnosis not present

## 2018-12-03 DIAGNOSIS — K580 Irritable bowel syndrome with diarrhea: Secondary | ICD-10-CM

## 2018-12-03 MED ORDER — VENLAFAXINE HCL ER 150 MG PO CP24: 150.0000 mg | ORAL_CAPSULE | Freq: Every day | ORAL | 3 refills | Status: DC

## 2018-12-03 MED ORDER — VENLAFAXINE HCL ER 75 MG PO CP24: 75.0000 mg | ORAL_CAPSULE | Freq: Every day | ORAL | 0 refills | Status: DC

## 2018-12-03 MED ORDER — VENLAFAXINE HCL ER 37.5 MG PO CP24: 37.5000 mg | ORAL_CAPSULE | Freq: Every day | ORAL | 0 refills | Status: DC

## 2018-12-03 MED ORDER — BENAZEPRIL HCL 40 MG PO TABS: 40.0000 mg | ORAL_TABLET | Freq: Every day | ORAL | 2 refills | Status: DC

## 2018-12-03 NOTE — Assessment & Plan Note (Signed)
stable °

## 2018-12-03 NOTE — Progress Notes (Signed)
BP (!) 153/87 (BP Location: Left Arm, Patient Position: Sitting, Cuff Size: Normal)   Pulse 78   Temp 98 F (36.7 C) (Oral)   Ht 5\' 9"  (1.753 m)   Wt 218 lb (98.9 kg)   SpO2 99%   BMI 32.19 kg/m    Subjective:    Patient ID: Jesse Dunlap, male    DOB: 09-17-1966, 53 y.o.   MRN: [id number]  HPI: Jesse Dunlap is a 53 y.o. male  Chief Complaint  Patient presents with  . Hypertension  Patient follow-up hypertension took medication just prior to the office visit otherwise blood pressures been doing well with no complaints. Depression may be 50% improved on fluoxetine has had some problems with irritable bowel has taken new medication for 2 weeks which may be helped some this time is gone on. Irritable bowel stable was able to get through a meal at a restaurant without having to use the bathroom. Discussed Imodium use. Also discussed sleep apnea as patient not using his CPAP. Not having problems with daytime drowsiness waking up feeling refreshed. Wife does say he snores and has occasional apneic spells.  Relevant past medical, surgical, family and social history reviewed and updated as indicated. Interim medical history since our last visit reviewed. Allergies and medications reviewed and updated.  Review of Systems  Constitutional: Negative.   Respiratory: Negative.   Cardiovascular: Negative.     Per HPI unless specifically indicated above     Objective:    BP (!) 153/87 (BP Location: Left Arm, Patient Position: Sitting, Cuff Size: Normal)   Pulse 78   Temp 98 F (36.7 C) (Oral)   Ht 5\' 9"  (1.753 m)   Wt 218 lb (98.9 kg)   SpO2 99%   BMI 32.19 kg/m   Wt Readings from Last 3 Encounters:  12/03/18 218 lb (98.9 kg)  11/07/18 214 lb 3.2 oz (97.2 kg)  10/21/18 218 lb (98.9 kg)    Physical Exam Constitutional:      Appearance: He is well-developed.  HENT:     Head: Normocephalic and atraumatic.  Eyes:     Conjunctiva/sclera: Conjunctivae normal.  Neck:    Musculoskeletal: Normal range of motion.  Cardiovascular:     Rate and Rhythm: Normal rate and regular rhythm.     Heart sounds: Normal heart sounds.  Pulmonary:     Effort: Pulmonary effort is normal.     Breath sounds: Normal breath sounds.  Musculoskeletal: Normal range of motion.  Skin:    Findings: No erythema.  Neurological:     Mental Status: He is alert and oriented to person, place, and time.  Psychiatric:        Behavior: Behavior normal.        Thought Content: Thought content normal.        Judgment: Judgment normal.     Results for orders placed or performed in visit on 05/29/18  LP+ALT+AST Piccolo, Waived  Result Value Ref Range   ALT (SGPT) Piccolo, Waived 24 10 - 47 U/L   AST (SGOT) Piccolo, Waived 21 11 - 38 U/L   Cholesterol Piccolo, Waived 214 (H) <200 mg/dL   HDL Chol Piccolo, Waived 73 >59 mg/dL   Triglycerides Piccolo,Waived 253 (H) <150 mg/dL   Chol/HDL Ratio Piccolo,Waive 3.0 mg/dL   LDL Chol Calc Piccolo Waived 91 <100 mg/dL   VLDL Chol Calc Piccolo,Waive 51 (H) <30 mg/dL      Assessment & Plan:   Problem List Items Addressed This Visit  Cardiovascular and Mediastinum   Hypertension - Primary    Poor control of blood pressure but just took medications will observe also do better with lifestyle.      Relevant Orders   Basic metabolic panel     Digestive   Irritable bowel syndrome    stable        Other   Depression, major, single episode, moderate (HCC)    Discussed depression and nerves will change to Effexor increasing from 37.5 to 150 Recheck in 1 month or so to assess blood pressure and change in medications.          Follow up plan: Return in about 4 weeks (around 12/31/2018) for BP, IBS and depression med check.

## 2018-12-03 NOTE — Assessment & Plan Note (Signed)
Poor control of blood pressure but just took medications will observe also do better with lifestyle.

## 2018-12-03 NOTE — Assessment & Plan Note (Signed)
Discussed depression and nerves will change to Effexor increasing from 37.5 to 150 Recheck in 1 month or so to assess blood pressure and change in medications.

## 2018-12-09 ENCOUNTER — Other Ambulatory Visit: Payer: Self-pay | Admitting: Family Medicine

## 2018-12-09 NOTE — Telephone Encounter (Signed)
Pt was put on venlafaxine 12/03/18= 37.5 mg #4; 75 mg #7; 150 mg #4 RF. Attempted to call pharmacy but is closed will open at 0900.

## 2018-12-17 ENCOUNTER — Encounter: Payer: Self-pay | Admitting: Family Medicine

## 2018-12-17 ENCOUNTER — Ambulatory Visit: Payer: 59 | Admitting: Family Medicine

## 2018-12-17 VITALS — BP 161/88 | HR 74 | Temp 98.6°F | Ht 69.0 in | Wt 214.0 lb

## 2018-12-17 DIAGNOSIS — M79671 Pain in right foot: Secondary | ICD-10-CM | POA: Diagnosis not present

## 2018-12-17 MED ORDER — TRIAMCINOLONE ACETONIDE 40 MG/ML IJ SUSP
40.0000 mg | Freq: Once | INTRAMUSCULAR | Status: AC
  Administered 2018-12-17: 40 mg via INTRAMUSCULAR

## 2018-12-17 NOTE — Progress Notes (Signed)
BP (!) 161/88   Pulse 74   Temp 98.6 F (37 C) (Oral)   Ht 5\' 9"  (1.753 m)   Wt 214 lb (97.1 kg)   SpO2 96%   BMI 31.60 kg/m    Subjective:    Patient ID: Jesse Dunlap, male    DOB: May 15, 1966, 53 y.o.   MRN: [id number]  HPI: Jesse Dunlap is a 53 y.o. male  Chief Complaint  Patient presents with  . Foot Pain    Right. Started yesterday. Ball of foot    Here today for sudden onset right foot pain that woke him from sleep overnight. No known injury, redness, bruising, numbness, tingling but area of pain noted to be swollen. Pain is localized to plantar surface below 1st and 2nd mtp joints. Tried 800 mg ibuprofen and iced foot with good improvement temporarily. Walking is incredibly painful so he has been out of work for this. No known hx of gout, arthritis, or previous foot injuries.   Relevant past medical, surgical, family and social history reviewed and updated as indicated. Interim medical history since our last visit reviewed. Allergies and medications reviewed and updated.  Review of Systems  Per HPI unless specifically indicated above     Objective:    BP (!) 161/88   Pulse 74   Temp 98.6 F (37 C) (Oral)   Ht 5\' 9"  (1.753 m)   Wt 214 lb (97.1 kg)   SpO2 96%   BMI 31.60 kg/m   Wt Readings from Last 3 Encounters:  12/17/18 214 lb (97.1 kg)  12/03/18 218 lb (98.9 kg)  11/07/18 214 lb 3.2 oz (97.2 kg)    Physical Exam Vitals signs and nursing note reviewed.  Constitutional:      Appearance: Normal appearance.  HENT:     Head: Atraumatic.  Eyes:     Extraocular Movements: Extraocular movements intact.     Conjunctiva/sclera: Conjunctivae normal.  Neck:     Musculoskeletal: Normal range of motion and neck supple.  Cardiovascular:     Rate and Rhythm: Normal rate and regular rhythm.  Pulmonary:     Effort: Pulmonary effort is normal.     Breath sounds: Normal breath sounds.  Musculoskeletal: Normal range of motion.        General: Swelling  (minimal edema plantar surface right foot below 1st and 2nd MTP joint) and tenderness (in area of swelling) present.  Skin:    General: Skin is warm and dry.     Findings: No erythema.  Neurological:     General: No focal deficit present.     Mental Status: He is oriented to person, place, and time.     Sensory: No sensory deficit.     Motor: No weakness.  Psychiatric:        Mood and Affect: Mood normal.        Thought Content: Thought content normal.        Judgment: Judgment normal.     Results for orders placed or performed in visit on 05/29/18  LP+ALT+AST Piccolo, Waived  Result Value Ref Range   ALT (SGPT) Piccolo, Waived 24 10 - 47 U/L   AST (SGOT) Piccolo, Waived 21 11 - 38 U/L   Cholesterol Piccolo, Waived 214 (H) <200 mg/dL   HDL Chol Piccolo, Waived 73 >59 mg/dL   Triglycerides Piccolo,Waived 253 (H) <150 mg/dL   Chol/HDL Ratio Piccolo,Waive 3.0 mg/dL   LDL Chol Calc Piccolo Waived 91 <100 mg/dL   VLDL Chol Calc  Piccolo,Waive 51 (H) <30 mg/dL      Assessment & Plan:   Problem List Items Addressed This Visit    None    Visit Diagnoses    Right foot pain    -  Primary   Tx with IM kenalog given in clinic and home epsom salt soaks, ibuprofen, tylenol, stretches, rest. Will get x-ray and uric acid level if no improvement   Relevant Medications   triamcinolone acetonide (KENALOG-40) injection 40 mg (Completed)       Follow up plan: Return if symptoms worsen or fail to improve.

## 2018-12-18 ENCOUNTER — Telehealth: Payer: Self-pay | Admitting: Family Medicine

## 2018-12-18 ENCOUNTER — Ambulatory Visit
Admission: RE | Admit: 2018-12-18 | Discharge: 2018-12-18 | Disposition: A | Discharge status: Home / Self Care | Payer: 59 | Attending: Family Medicine | Admitting: Family Medicine

## 2018-12-18 ENCOUNTER — Ambulatory Visit
Admission: RE | Admit: 2018-12-18 | Discharge: 2018-12-18 | Disposition: A | Discharge status: Home / Self Care | Payer: 59 | Source: Ambulatory Visit | Attending: Family Medicine | Admitting: Family Medicine

## 2018-12-18 ENCOUNTER — Other Ambulatory Visit: Payer: 59

## 2018-12-18 DIAGNOSIS — M79671 Pain in right foot: Secondary | ICD-10-CM | POA: Insufficient documentation

## 2018-12-18 DIAGNOSIS — M79673 Pain in unspecified foot: Secondary | ICD-10-CM | POA: Insufficient documentation

## 2018-12-18 DIAGNOSIS — M19071 Primary osteoarthritis, right ankle and foot: Secondary | ICD-10-CM | POA: Diagnosis not present

## 2018-12-18 NOTE — Telephone Encounter (Signed)
Test has been ordered. I've also got a foot x-ray ordered if he decides he wants to go get an x-ray  Copied from [place] [phone]. Topic: General - Inquiry >> Dec 18, 2018  9:47 AM Vernona Rieger wrote: Reason for CRM: Patient states he seen Jesse Dunlap yesterday and was given a steroid shot in his foot. She advised him that it should get better in the next day or two. He said its gotten worse in his opinion and not better. He said he really needs to get back to work. He said she mentioned a blood test, he is wondering should he come in today for that. Please advise.

## 2018-12-18 NOTE — Telephone Encounter (Signed)
Message relayed to patient. Verbalized understanding and denied questions. Pt will come in now to have lab drawn, then will go get Xray as he is concerned about getting back to work.

## 2018-12-19 ENCOUNTER — Telehealth: Payer: Self-pay | Admitting: Family Medicine

## 2018-12-19 DIAGNOSIS — M79671 Pain in right foot: Secondary | ICD-10-CM

## 2018-12-19 LAB — URIC ACID: Uric Acid: 5.1 mg/dL (ref 3.7–8.6)

## 2018-12-19 MED ORDER — HYDROCODONE-ACETAMINOPHEN 5-325 MG PO TABS: 1.0000 | ORAL_TABLET | Freq: Two times a day (BID) | ORAL | 0 refills | Status: DC | PRN

## 2018-12-19 MED ORDER — PREDNISONE 20 MG PO TABS: 40.0000 mg | ORAL_TABLET | Freq: Every day | ORAL | 0 refills | Status: DC

## 2018-12-19 NOTE — Telephone Encounter (Signed)
Are these results available?

## 2018-12-19 NOTE — Telephone Encounter (Signed)
I released both to mychart first thing thing morning

## 2018-12-19 NOTE — Telephone Encounter (Signed)
Copied from [place] [id number]. Topic: Quick Communication - See Telephone Encounter >> Dec 19, 2018 10:18 AM Blase Mess A wrote: CRM for notification. See Telephone encounter for: 12/19/18.  Patient is calling back for x ray and lab results please advise [phone]

## 2018-12-19 NOTE — Telephone Encounter (Signed)
Patient notified, please send meds to [facility] and place podiatry referral

## 2018-12-19 NOTE — Telephone Encounter (Signed)
Patient notified of results, he does not use MyChart, patient states that he is not any better at all, and he needs to get back to work.

## 2018-12-19 NOTE — Telephone Encounter (Signed)
He could either go to the [facility] to see if they could figure out why he's hurting or we could put in a referral to podiatry and start oral steroids and some low dose pain medicine in meantime

## 2018-12-19 NOTE — Addendum Note (Signed)
Addended by: Merrie Roof E on: 12/19/2018 04:20 PM   Modules accepted: Orders

## 2018-12-19 NOTE — Telephone Encounter (Signed)
Rx's sent and referral placed

## 2019-01-07 ENCOUNTER — Ambulatory Visit: Payer: BLUE CROSS/BLUE SHIELD | Admitting: Family Medicine

## 2019-01-07 ENCOUNTER — Encounter: Payer: Self-pay | Admitting: Family Medicine

## 2019-01-07 DIAGNOSIS — I10 Essential (primary) hypertension: Secondary | ICD-10-CM

## 2019-01-07 DIAGNOSIS — F321 Major depressive disorder, single episode, moderate: Secondary | ICD-10-CM

## 2019-01-07 DIAGNOSIS — K529 Noninfective gastroenteritis and colitis, unspecified: Secondary | ICD-10-CM

## 2019-01-07 MED ORDER — VENLAFAXINE HCL ER 150 MG PO CP24: 150.0000 mg | ORAL_CAPSULE | Freq: Every day | ORAL | 2 refills | Status: DC

## 2019-01-07 MED ORDER — AMLODIPINE BESYLATE 10 MG PO TABS: 10.0000 mg | ORAL_TABLET | Freq: Every day | ORAL | 2 refills | Status: DC

## 2019-01-07 NOTE — Progress Notes (Signed)
BP (!) 148/94   Pulse 74   Temp 98.9 F (37.2 C) (Oral)   Wt 216 lb (98 kg)   SpO2 98%   BMI 31.90 kg/m    Subjective:    Patient ID: Jesse Dunlap, male    DOB: 10/02/1966, 53 y.o.   MRN: [id number]  HPI: Jesse Dunlap is a 53 y.o. male  Chief Complaint  Patient presents with  . Depression  . Hypertension  . Irritable Bowel Syndrome   Patient follow-up hypertension.  Has been taking his blood pressure medications faithfully without problems or side effects. Unfortunately has lost weight but gained weight also.Marland Kitchen Foot issues have finally resolved and is back to normal has canceled podiatry consult. Nerve issues depression reports about 80% improved.  No side effects or issues with medications which he is taking faithfully.  Relevant past medical, surgical, family and social history reviewed and updated as indicated. Interim medical history since our last visit reviewed. Allergies and medications reviewed and updated.  Review of Systems  Constitutional: Negative.   Respiratory: Negative.   Cardiovascular: Negative.     Per HPI unless specifically indicated above     Objective:    BP (!) 148/94   Pulse 74   Temp 98.9 F (37.2 C) (Oral)   Wt 216 lb (98 kg)   SpO2 98%   BMI 31.90 kg/m   Wt Readings from Last 3 Encounters:  01/07/19 216 lb (98 kg)  12/17/18 214 lb (97.1 kg)  12/03/18 218 lb (98.9 kg)    Physical Exam Constitutional:      Appearance: He is well-developed.  HENT:     Head: Normocephalic and atraumatic.  Eyes:     Conjunctiva/sclera: Conjunctivae normal.  Neck:     Musculoskeletal: Normal range of motion.  Cardiovascular:     Rate and Rhythm: Normal rate and regular rhythm.     Heart sounds: Normal heart sounds.  Pulmonary:     Effort: Pulmonary effort is normal.     Breath sounds: Normal breath sounds.  Musculoskeletal: Normal range of motion.  Skin:    Findings: No erythema.  Neurological:     Mental Status: He is alert and  oriented to person, place, and time.  Psychiatric:        Behavior: Behavior normal.        Thought Content: Thought content normal.        Judgment: Judgment normal.     Results for orders placed or performed in visit on 12/18/18  Uric acid  Result Value Ref Range   Uric Acid 5.1 3.7 - 8.6 mg/dL      Assessment & Plan:   Problem List Items Addressed This Visit      Cardiovascular and Mediastinum   Hypertension    Discuss hypertension poor control will increase amlodipine from 5 mg with increased to 10 mg.  Patient will monitor blood pressure and if not doing well with this change or having side effects will let us know.  Otherwise recheck at physical in June.      Relevant Medications   amLODipine (NORVASC) 10 MG tablet     Digestive   Chronic diarrhea    Improved         Other   Depression, major, single episode, moderate (HCC)    Discussed Effexor 150 will continue discussed duration of treatment and considerations.      Relevant Medications   venlafaxine XR (EFFEXOR-XR) 150 MG 24 hr capsule  Follow up plan: Return in about 3 months (around 04/09/2019) for Physical Exam.

## 2019-01-07 NOTE — Assessment & Plan Note (Signed)
Improved

## 2019-01-07 NOTE — Assessment & Plan Note (Signed)
Discussed Effexor 150 will continue discussed duration of treatment and considerations.

## 2019-01-07 NOTE — Assessment & Plan Note (Signed)
Discuss hypertension poor control will increase amlodipine from 5 mg with increased to 10 mg.  Patient will monitor blood pressure and if not doing well with this change or having side effects will let us know.  Otherwise recheck at physical in June.

## 2019-04-08 ENCOUNTER — Ambulatory Visit: Payer: BC Managed Care – PPO | Admitting: Family Medicine

## 2019-04-08 ENCOUNTER — Other Ambulatory Visit: Payer: Self-pay

## 2019-04-08 ENCOUNTER — Encounter: Payer: Self-pay | Admitting: Family Medicine

## 2019-04-08 VITALS — BP 147/89 | HR 85 | Temp 98.4°F | Ht 69.0 in | Wt 215.0 lb

## 2019-04-08 DIAGNOSIS — M542 Cervicalgia: Secondary | ICD-10-CM

## 2019-04-08 MED ORDER — CYCLOBENZAPRINE HCL 10 MG PO TABS: 10.0000 mg | ORAL_TABLET | Freq: Three times a day (TID) | ORAL | 0 refills | Status: DC | PRN

## 2019-04-08 MED ORDER — KETOROLAC TROMETHAMINE 60 MG/2ML IM SOLN
60.0000 mg | Freq: Once | INTRAMUSCULAR | Status: AC
  Administered 2019-04-08: 60 mg via INTRAMUSCULAR

## 2019-04-08 NOTE — Patient Instructions (Signed)
Ibuprofen - Take 600-800 mg three times daily as needed Can also take tylenol [phone] mg four times daily as needed

## 2019-04-08 NOTE — Progress Notes (Signed)
BP (!) 147/89   Pulse 85   Temp 98.4 F (36.9 C) (Oral)   Ht 5\' 9"  (1.753 m)   Wt 215 lb (97.5 kg)   SpO2 98%   BMI 31.75 kg/m    Subjective:    Patient ID: Jesse Dunlap, male    DOB: 1966-04-22, 53 y.o.   MRN: [id number]  HPI: Jesse Dunlap is a 53 y.o. male  Chief Complaint  Patient presents with  . Neck Pain    x several weeks. tried OTC ibuprofen, ice packs and went to the chiropractic   Here today for several weeks of right sided neck pain. Feels like he's pulled a muscle in his neck, dull achy pulling pains that are worse with movement. Went to chiropractor several times which barely helped. Feels his job makes it worse, sleeps face down which he feels also could have been an issue. Denies radiation of pain, numbness, tingling, weakness, injury.   Relevant past medical, surgical, family and social history reviewed and updated as indicated. Interim medical history since our last visit reviewed. Allergies and medications reviewed and updated.  Review of Systems  Per HPI unless specifically indicated above     Objective:    BP (!) 147/89   Pulse 85   Temp 98.4 F (36.9 C) (Oral)   Ht 5\' 9"  (1.753 m)   Wt 215 lb (97.5 kg)   SpO2 98%   BMI 31.75 kg/m   Wt Readings from Last 3 Encounters:  04/08/19 215 lb (97.5 kg)  01/07/19 216 lb (98 kg)  12/17/18 214 lb (97.1 kg)    Physical Exam Vitals signs and nursing note reviewed.  Constitutional:      Appearance: Normal appearance.  HENT:     Head: Atraumatic.  Eyes:     Extraocular Movements: Extraocular movements intact.     Conjunctiva/sclera: Conjunctivae normal.  Neck:     Musculoskeletal: Normal range of motion and neck supple.  Cardiovascular:     Rate and Rhythm: Normal rate and regular rhythm.  Pulmonary:     Effort: Pulmonary effort is normal.     Breath sounds: Normal breath sounds.  Musculoskeletal: Normal range of motion.        General: Tenderness (right neck and trapezius ttp) present.   Skin:    General: Skin is warm and dry.  Neurological:     General: No focal deficit present.     Mental Status: He is oriented to person, place, and time.     Sensory: No sensory deficit.     Motor: No weakness.  Psychiatric:        Mood and Affect: Mood normal.        Thought Content: Thought content normal.        Judgment: Judgment normal.     Results for orders placed or performed in visit on 12/18/18  Uric acid  Result Value Ref Range   Uric Acid 5.1 3.7 - 8.6 mg/dL      Assessment & Plan:   Problem List Items Addressed This Visit    None    Visit Diagnoses    Acute neck pain    -  Primary   TX with IM toradol, flexeril prn, and heat, massage, stretches. May try adjusting sleep position, postural improvements, core strengthening   Relevant Medications   ketorolac (TORADOL) injection 60 mg (Completed)       Follow up plan: Return if symptoms worsen or fail to improve.

## 2019-04-22 ENCOUNTER — Encounter: Payer: Self-pay | Admitting: Family Medicine

## 2019-04-22 ENCOUNTER — Ambulatory Visit: Payer: BC Managed Care – PPO | Admitting: Family Medicine

## 2019-04-22 ENCOUNTER — Other Ambulatory Visit: Payer: Self-pay

## 2019-04-22 ENCOUNTER — Ambulatory Visit
Admission: RE | Admit: 2019-04-22 | Discharge: 2019-04-22 | Disposition: A | Discharge status: Home / Self Care | Payer: BC Managed Care – PPO | Source: Ambulatory Visit | Attending: Family Medicine | Admitting: Family Medicine

## 2019-04-22 VITALS — BP 146/95 | HR 91 | Temp 98.2°F | Ht 69.0 in | Wt 217.0 lb

## 2019-04-22 DIAGNOSIS — M542 Cervicalgia: Secondary | ICD-10-CM | POA: Diagnosis not present

## 2019-04-22 NOTE — Progress Notes (Signed)
   BP (!) 146/95   Pulse 91   Temp 98.2 F (36.8 C) (Oral)   Ht 5\' 9"  (1.753 m)   Wt 217 lb (98.4 kg)   SpO2 96%   BMI 32.05 kg/m    Subjective:    Patient ID: Jesse Dunlap, male    DOB: 27-Jun-1966, 53 y.o.   MRN: [id number]  HPI: Jesse Dunlap is a 53 y.o. male  Chief Complaint  Patient presents with  . Neck Pain    x about a month. tried OTC ibuprofen, tylenol. RX muscle relaxer.   Here today for persistent right sided aching neck pain for over a month that's sharp with movement. Tried IM kenalog, flexeril, NSAIDs, tylenol, heat, massage, chiropractic without relief. Denies radiation of pain down arm, numbness, tingling, past hx of neck problems or injuries.   Relevant past medical, surgical, family and social history reviewed and updated as indicated. Interim medical history since our last visit reviewed. Allergies and medications reviewed and updated.  Review of Systems  Per HPI unless specifically indicated above     Objective:    BP (!) 146/95   Pulse 91   Temp 98.2 F (36.8 C) (Oral)   Ht 5\' 9"  (1.753 m)   Wt 217 lb (98.4 kg)   SpO2 96%   BMI 32.05 kg/m   Wt Readings from Last 3 Encounters:  04/22/19 217 lb (98.4 kg)  04/08/19 215 lb (97.5 kg)  01/07/19 216 lb (98 kg)    Physical Exam Vitals signs and nursing note reviewed.  Constitutional:      Appearance: Normal appearance.  HENT:     Head: Atraumatic.  Eyes:     Extraocular Movements: Extraocular movements intact.     Conjunctiva/sclera: Conjunctivae normal.  Neck:     Musculoskeletal: Normal range of motion and neck supple.  Cardiovascular:     Rate and Rhythm: Normal rate and regular rhythm.  Pulmonary:     Effort: Pulmonary effort is normal.     Breath sounds: Normal breath sounds.  Musculoskeletal: Normal range of motion.        General: Tenderness (right trapezius and cervical paraspinal muscle ttp and spasm) present. No deformity or signs of injury.  Skin:    General: Skin is warm  and dry.  Neurological:     General: No focal deficit present.     Mental Status: He is oriented to person, place, and time.     Sensory: No sensory deficit.     Motor: No weakness.  Psychiatric:        Mood and Affect: Mood normal.        Thought Content: Thought content normal.        Judgment: Judgment normal.     Results for orders placed or performed in visit on 12/18/18  Uric acid  Result Value Ref Range   Uric Acid 5.1 3.7 - 8.6 mg/dL      Assessment & Plan:   Problem List Items Addressed This Visit    None    Visit Diagnoses    Neck pain on right side    -  Primary   Not improving with conservative measures. Will obtain c spine x-ray and refer to orthopedics. Continue supportive care, stretches, massage   Relevant Orders   AMB referral to orthopedics   DG Cervical Spine Complete (Completed)       Follow up plan: Return if symptoms worsen or fail to improve.

## 2019-04-29 ENCOUNTER — Encounter: Payer: BLUE CROSS/BLUE SHIELD | Admitting: Family Medicine

## 2019-05-01 DIAGNOSIS — M5030 Other cervical disc degeneration, unspecified cervical region: Secondary | ICD-10-CM | POA: Diagnosis not present

## 2019-06-15 DIAGNOSIS — M5030 Other cervical disc degeneration, unspecified cervical region: Secondary | ICD-10-CM | POA: Diagnosis not present

## 2019-06-18 DIAGNOSIS — M5030 Other cervical disc degeneration, unspecified cervical region: Secondary | ICD-10-CM | POA: Diagnosis not present

## 2019-07-02 DIAGNOSIS — M542 Cervicalgia: Secondary | ICD-10-CM | POA: Diagnosis not present

## 2019-07-02 DIAGNOSIS — M47812 Spondylosis without myelopathy or radiculopathy, cervical region: Secondary | ICD-10-CM | POA: Diagnosis not present

## 2019-08-22 DIAGNOSIS — M62838 Other muscle spasm: Secondary | ICD-10-CM | POA: Diagnosis not present

## 2019-08-22 DIAGNOSIS — E7800 Pure hypercholesterolemia, unspecified: Secondary | ICD-10-CM | POA: Diagnosis not present

## 2019-08-22 DIAGNOSIS — I10 Essential (primary) hypertension: Secondary | ICD-10-CM | POA: Diagnosis not present

## 2019-08-22 DIAGNOSIS — E119 Type 2 diabetes mellitus without complications: Secondary | ICD-10-CM | POA: Diagnosis not present

## 2019-08-22 DIAGNOSIS — M542 Cervicalgia: Secondary | ICD-10-CM | POA: Diagnosis not present

## 2019-08-22 DIAGNOSIS — R519 Headache, unspecified: Secondary | ICD-10-CM | POA: Diagnosis not present

## 2019-08-24 ENCOUNTER — Telehealth: Payer: Self-pay | Admitting: Family Medicine

## 2019-08-24 ENCOUNTER — Other Ambulatory Visit: Payer: Self-pay | Admitting: Family Medicine

## 2019-08-24 DIAGNOSIS — I10 Essential (primary) hypertension: Secondary | ICD-10-CM

## 2019-08-24 NOTE — Telephone Encounter (Signed)
It appears he was referred to orthopedics in June. Did he go? Does he want neurology or neurosurgery? It may be best for him to have a follow up appointment so we send him to the appropriate specialist.

## 2019-08-24 NOTE — Telephone Encounter (Signed)
Copied from [place] [phone]. Topic: Referral - Request for Referral >> Aug 24, 2019  3:36 PM Leward Quan A wrote: Has patient seen PCP for this complaint? Yes.   *If NO, is insurance requiring patient see PCP for this issue before PCP can refer them? Referral for which specialty: Neurologist  Preferred provider/office: [facility] Fax# [phone] Reason for referral: Neck pain

## 2019-08-24 NOTE — Telephone Encounter (Signed)
Called and spoke with patient. He stated that he did go to see the orthopedics. Stated they did xrays and an MRI but he's still in pain. Scheduled an OV with Dr. Wynetta Emery for 08/25/19 at 11:15 am to discuss treatment options. FYI

## 2019-08-25 ENCOUNTER — Other Ambulatory Visit: Payer: Self-pay

## 2019-08-25 ENCOUNTER — Encounter: Payer: Self-pay | Admitting: Family Medicine

## 2019-08-25 ENCOUNTER — Ambulatory Visit (INDEPENDENT_AMBULATORY_CARE_PROVIDER_SITE_OTHER): Payer: BC Managed Care – PPO | Admitting: Family Medicine

## 2019-08-25 VITALS — BP 128/90 | HR 82 | Temp 98.7°F | Ht 69.0 in | Wt 204.0 lb

## 2019-08-25 DIAGNOSIS — Z23 Encounter for immunization: Secondary | ICD-10-CM | POA: Diagnosis not present

## 2019-08-25 DIAGNOSIS — M5412 Radiculopathy, cervical region: Secondary | ICD-10-CM | POA: Diagnosis not present

## 2019-08-25 MED ORDER — GABAPENTIN 100 MG PO CAPS: 100.0000 mg | ORAL_CAPSULE | Freq: Every day | ORAL | 3 refills | Status: DC

## 2019-08-25 NOTE — Assessment & Plan Note (Signed)
MRI done at [facility] showed disc protrusion at C4-5. He had an epidural steroid injection done without benefit. He has not followed up with them since then. He has not done any physical therapy. No stretches. Not on any medication for this except OTC ibuprofen. He would like to see neurology/neurosurgery. We will refer him to neurosurgery and provide a records release for [facility] for him. I will also get him into PT- referral generated today and appointment scheduled. Start him on gabapentin qHS to titrate up as tolerated. Recheck 2-3 weeks. Call with any concerns. Continue to monitor.

## 2019-08-25 NOTE — Progress Notes (Signed)
BP 128/90   Pulse 82   Temp 98.7 F (37.1 C) (Oral)   Ht 5\' 9"  (1.753 m)   Wt 204 lb (92.5 kg)   SpO2 98%   BMI 30.13 kg/m    Subjective:    Patient ID: Jesse Dunlap, male    DOB: November 02, 1966, 53 y.o.   MRN: [id number]  HPI: Jesse Dunlap is a 53 y.o. male  Chief Complaint  Patient presents with  . Neck Pain    discuss referral   Has been having neck pain since June. Has been feeling terrible. Pain seems to come and go.   NECK PAIN FOLLOW UP Duration: 4-5 months Status: worse Treatments attempted: nerve block, vicodin, morphine, prednisone, rest, ice, heat, APAP, ibuprofen, aleve, muscle relaxer Compliant with recommended treatment: yes Relief with NSAIDs?:  moderate- some of the time Location: R sided neck pain that radiates into his R arm Duration:4-5 months Severity: severe Quality: dull ache and then sharp shooting Frequency: intermittent Radiation: R arm Aggravating factors: working, sleeping, twisting Alleviating factors: occasionally ibuprofen, morphine- large dose taken on his own Weakness:  no Paresthesias / decreased sensation:  yes  Fevers:  no  Relevant past medical, surgical, family and social history reviewed and updated as indicated. Interim medical history since our last visit reviewed. Allergies and medications reviewed and updated.  Review of Systems  Constitutional: Negative.   Respiratory: Negative.   Cardiovascular: Negative.   Gastrointestinal: Negative.   Musculoskeletal: Positive for myalgias, neck pain and neck stiffness. Negative for arthralgias, back pain, gait problem and joint swelling.  Skin: Negative.   Neurological: Positive for numbness. Negative for dizziness, tremors, seizures, syncope, facial asymmetry, speech difficulty, weakness, light-headedness and headaches.  Hematological: Negative.   Psychiatric/Behavioral: Negative.     Per HPI unless specifically indicated above     Objective:    BP 128/90   Pulse 82    Temp 98.7 F (37.1 C) (Oral)   Ht 5\' 9"  (1.753 m)   Wt 204 lb (92.5 kg)   SpO2 98%   BMI 30.13 kg/m   Wt Readings from Last 3 Encounters:  08/25/19 204 lb (92.5 kg)  04/22/19 217 lb (98.4 kg)  04/08/19 215 lb (97.5 kg)    Physical Exam Vitals signs and nursing note reviewed.  Constitutional:      General: He is not in acute distress.    Appearance: Normal appearance. He is not ill-appearing, toxic-appearing or diaphoretic.  HENT:     Head: Normocephalic and atraumatic.     Right Ear: External ear normal.     Left Ear: External ear normal.     Nose: Nose normal.     Mouth/Throat:     Mouth: Mucous membranes are moist.     Pharynx: Oropharynx is clear.  Eyes:     General: No scleral icterus.       Right eye: No discharge.        Left eye: No discharge.     Extraocular Movements: Extraocular movements intact.     Conjunctiva/sclera: Conjunctivae normal.     Pupils: Pupils are equal, round, and reactive to light.  Neck:     Musculoskeletal: Normal range of motion and neck supple.  Cardiovascular:     Rate and Rhythm: Normal rate and regular rhythm.     Pulses: Normal pulses.     Heart sounds: Normal heart sounds. No murmur. No friction rub. No gallop.   Pulmonary:     Effort: Pulmonary effort is normal. No  respiratory distress.     Breath sounds: Normal breath sounds. No stridor. No wheezing, rhonchi or rales.  Chest:     Chest wall: No tenderness.  Musculoskeletal: Normal range of motion.  Skin:    General: Skin is warm and dry.     Capillary Refill: Capillary refill takes less than 2 seconds.     Coloration: Skin is not jaundiced or pale.     Findings: No bruising, erythema, lesion or rash.  Neurological:     General: No focal deficit present.     Mental Status: He is alert and oriented to person, place, and time. Mental status is at baseline.  Psychiatric:        Mood and Affect: Mood normal.        Behavior: Behavior normal.        Thought Content: Thought  content normal.        Judgment: Judgment normal.     Results for orders placed or performed in visit on 12/18/18  Uric acid  Result Value Ref Range   Uric Acid 5.1 3.7 - 8.6 mg/dL      Assessment & Plan:   Problem List Items Addressed This Visit      Nervous and Auditory   Cervical radiculopathy - Primary    MRI done at [facility] showed disc protrusion at C4-5. He had an epidural steroid injection done without benefit. He has not followed up with them since then. He has not done any physical therapy. No stretches. Not on any medication for this except OTC ibuprofen. He would like to see neurology/neurosurgery. We will refer him to neurosurgery and provide a records release for [facility] for him. I will also get him into PT- referral generated today and appointment scheduled. Start him on gabapentin qHS to titrate up as tolerated. Recheck 2-3 weeks. Call with any concerns. Continue to monitor.       Relevant Medications   gabapentin (NEURONTIN) 100 MG capsule   Other Relevant Orders   Ambulatory referral to Physical Therapy   Ambulatory referral to Neurosurgery    Other Visit Diagnoses    Need for pneumococcal vaccine       Pneumovax given today.    Relevant Orders   Pneumococcal polysaccharide vaccine 23-valent greater than or equal to 2yo subcutaneous/IM (Completed)   Need for Tdap vaccination       Tdap given today.    Relevant Orders   Tdap vaccine greater than or equal to 7yo IM (Completed)       Follow up plan: Return 2- 3 weeks, for follow up neck pain.

## 2019-08-28 DIAGNOSIS — I10 Essential (primary) hypertension: Secondary | ICD-10-CM | POA: Diagnosis not present

## 2019-08-28 DIAGNOSIS — Z6830 Body mass index (BMI) 30.0-30.9, adult: Secondary | ICD-10-CM | POA: Diagnosis not present

## 2019-08-28 DIAGNOSIS — M47812 Spondylosis without myelopathy or radiculopathy, cervical region: Secondary | ICD-10-CM | POA: Diagnosis not present

## 2019-09-07 ENCOUNTER — Ambulatory Visit: Payer: BC Managed Care – PPO | Attending: Family Medicine | Admitting: Physical Therapy

## 2019-09-09 ENCOUNTER — Ambulatory Visit: Payer: BC Managed Care – PPO | Admitting: Physical Therapy

## 2019-09-14 ENCOUNTER — Encounter: Payer: BC Managed Care – PPO | Admitting: Physical Therapy

## 2019-09-16 ENCOUNTER — Encounter: Payer: BC Managed Care – PPO | Admitting: Physical Therapy

## 2019-09-18 ENCOUNTER — Ambulatory Visit: Payer: BC Managed Care – PPO | Admitting: Family Medicine

## 2019-09-21 ENCOUNTER — Encounter: Payer: BC Managed Care – PPO | Admitting: Physical Therapy

## 2019-09-23 ENCOUNTER — Encounter: Payer: BC Managed Care – PPO | Admitting: Physical Therapy

## 2019-09-23 ENCOUNTER — Other Ambulatory Visit: Payer: Self-pay

## 2019-09-23 NOTE — Telephone Encounter (Signed)
Patient last seen 08/25/19

## 2019-09-24 ENCOUNTER — Other Ambulatory Visit: Payer: Self-pay

## 2019-09-24 NOTE — Telephone Encounter (Signed)
[facility] faxed Rx refill request on amlodipine 10 mg tablets.  Open in error

## 2019-09-28 ENCOUNTER — Encounter: Payer: BC Managed Care – PPO | Admitting: Physical Therapy

## 2019-09-29 MED ORDER — AMLODIPINE BESYLATE 10 MG PO TABS: 10.0000 mg | ORAL_TABLET | Freq: Every day | ORAL | 1 refills | Status: DC

## 2019-09-29 NOTE — Telephone Encounter (Signed)
Appt scheduled

## 2019-09-29 NOTE — Telephone Encounter (Signed)
Routing to provider  

## 2019-09-29 NOTE — Telephone Encounter (Signed)
Needs an appointment for his blood pressure. Will get him enough medicine to make it to appointment when it's booked.

## 2019-09-30 ENCOUNTER — Encounter: Payer: BC Managed Care – PPO | Admitting: Physical Therapy

## 2019-10-05 ENCOUNTER — Encounter: Payer: BC Managed Care – PPO | Admitting: Physical Therapy

## 2019-10-13 DIAGNOSIS — M47812 Spondylosis without myelopathy or radiculopathy, cervical region: Secondary | ICD-10-CM | POA: Diagnosis not present

## 2019-10-14 ENCOUNTER — Ambulatory Visit (INDEPENDENT_AMBULATORY_CARE_PROVIDER_SITE_OTHER): Payer: BC Managed Care – PPO | Admitting: Family Medicine

## 2019-10-14 ENCOUNTER — Other Ambulatory Visit: Payer: Self-pay

## 2019-10-14 ENCOUNTER — Encounter: Payer: Self-pay | Admitting: Family Medicine

## 2019-10-14 VITALS — BP 142/100 | HR 108

## 2019-10-14 DIAGNOSIS — F321 Major depressive disorder, single episode, moderate: Secondary | ICD-10-CM

## 2019-10-14 DIAGNOSIS — I10 Essential (primary) hypertension: Secondary | ICD-10-CM | POA: Diagnosis not present

## 2019-10-14 DIAGNOSIS — E7800 Pure hypercholesterolemia, unspecified: Secondary | ICD-10-CM | POA: Diagnosis not present

## 2019-10-14 NOTE — Assessment & Plan Note (Signed)
Running high today- had a steroid shot yesterday, was good before that. Will continue current regimen and he will keep an eye on his BP if continuing to stay above 140/90 will call.

## 2019-10-14 NOTE — Progress Notes (Signed)
BP (!) 142/100   Pulse (!) 108    Subjective:    Patient ID: Jesse Dunlap, male    DOB: February 11, 1966, 53 y.o.   MRN: [id number]  HPI: Atticus Wedin is a 53 y.o. male  Chief Complaint  Patient presents with  . Hypertension   HYPERTENSION / [place] Satisfied with current treatment? yes Duration of hypertension: chronic BP monitoring frequency: a few times a month BP medication side effects: no Past BP meds: amlodipine, benazepril Duration of hyperlipidemia: chronic Cholesterol medication side effects: not on anything Cholesterol supplements: none Past cholesterol medications: none Medication compliance: excellent compliance Aspirin: no Recent stressors: yes Recurrent headaches: no Visual changes: no Palpitations: no Dyspnea: no Chest pain: no Lower extremity edema: no Dizzy/lightheaded: no  DEPRESSION Mood status: controlled Satisfied with current treatment?: yes Symptom severity: mild  Duration of current treatment : chronic Side effects: no Medication compliance: excellent compliance Psychotherapy/counseling: no  Previous psychiatric medications: venlafaxine Depressed mood: no Anxious mood: no Anhedonia: no Significant weight loss or gain: no Insomnia: no  Fatigue: no Feelings of worthlessness or guilt: no Impaired concentration/indecisiveness: no Suicidal ideations: no Hopelessness: no Crying spells: no Depression screen [facility] 2/9 10/14/2019 08/25/2019 04/22/2019 04/08/2019 01/07/2019  Decreased Interest 0 _0 Down, Depressed, Hopeless 0 _1 PHQ - 2 Score 0 _2 Altered sleeping 0 _3 Tired, decreased energy 0 _4 Change in appetite 0 _5 Feeling bad or failure about yourself  _6 Trouble concentrating 0 _7 Moving slowly or fidgety/restless 0 0 0 0 1  Suicidal thoughts 0 0 0 0 0  PHQ-9 Score _8 Difficult doing work/chores Not difficult at all Somewhat difficult - - -  Some recent data might be  hidden    Relevant past medical, surgical, family and social history reviewed and updated as indicated. Interim medical history since our last visit reviewed. Allergies and medications reviewed and updated.  Review of Systems  Constitutional: Negative.   Respiratory: Negative.   Cardiovascular: Negative.   Musculoskeletal: Positive for neck pain and neck stiffness. Negative for arthralgias, back pain, gait problem, joint swelling and myalgias.  Skin: Negative.   Neurological: Negative.   Psychiatric/Behavioral: Negative.     Per HPI unless specifically indicated above     Objective:    BP (!) 142/100   Pulse (!) 108   Wt Readings from Last 3 Encounters:  08/25/19 204 lb (92.5 kg)  04/22/19 217 lb (98.4 kg)  04/08/19 215 lb (97.5 kg)    Physical Exam Vitals signs and nursing note reviewed.  Constitutional:      General: He is not in acute distress.    Appearance: Normal appearance. He is not ill-appearing, toxic-appearing or diaphoretic.  HENT:     Head: Normocephalic and atraumatic.     Right Ear: External ear normal.     Left Ear: External ear normal.     Nose: Nose normal.     Mouth/Throat:     Mouth: Mucous membranes are moist.     Pharynx: Oropharynx is clear.  Eyes:     General: No scleral icterus.       Right eye: No discharge.        Left eye: No discharge.     Conjunctiva/sclera: Conjunctivae normal.     Pupils: Pupils are equal, round,  and reactive to light.  Neck:     Musculoskeletal: Normal range of motion.  Pulmonary:     Effort: Pulmonary effort is normal. No respiratory distress.     Comments: Speaking in full sentences Musculoskeletal: Normal range of motion.  Skin:    Coloration: Skin is not jaundiced or pale.     Findings: No bruising, erythema, lesion or rash.  Neurological:     Mental Status: He is alert and oriented to person, place, and time. Mental status is at baseline.  Psychiatric:        Mood and Affect: Mood normal.         Behavior: Behavior normal.        Thought Content: Thought content normal.        Judgment: Judgment normal.     Results for orders placed or performed in visit on 12/18/18  Uric acid  Result Value Ref Range   Uric Acid 5.1 3.7 - 8.6 mg/dL      Assessment & Plan:   Problem List Items Addressed This Visit      Cardiovascular and Mediastinum   Hypertension - Primary    Running high today- had a steroid shot yesterday, was good before that. Will continue current regimen and he will keep an eye on his BP if continuing to stay above 140/90 will call.       Relevant Orders   Microalbumin, Urine Waived   Comp Met (CMET)     Other   Depression, major, single episode, moderate (HCC)    Under good control on current regimen. Continue current regimen. Continue to monitor. Call with any concerns. Refills given.        Hypercholesteremia    Due for recheck on his labs. Will get him in for labs. Await results. Call with any concerns.       Relevant Orders   Comp Met (CMET)   Lipid Panel w/o Chol/HDL Ratio OUT       Follow up plan: Return in about 6 months (around 04/13/2020) for Physical.    . This visit was completed via [employer] due to the restrictions of the COVID-19 pandemic. All issues as above were discussed and addressed. Physical exam was done as above through visual confirmation on [employer]. If it was felt that the patient should be evaluated in the office, they were directed there. The patient verbally consented to this visit. . Location of the patient: home . Location of the provider: home . Those involved with this call:  . Provider: Park Liter, DO . CMA: Tiffany Reel, CMA . Front Desk/Registration: Don Perking  . Time spent on call: 25 minutes with patient face to face via video conference. More than 50% of this time was spent in counseling and coordination of care. 40 minutes total spent in review of patient's record and preparation of their chart.

## 2019-10-14 NOTE — Assessment & Plan Note (Signed)
Under good control on current regimen. Continue current regimen. Continue to monitor. Call with any concerns. Refills given.   

## 2019-10-14 NOTE — Assessment & Plan Note (Signed)
Due for recheck on his labs. Will get him in for labs. Await results. Call with any concerns.

## 2019-10-20 ENCOUNTER — Other Ambulatory Visit: Payer: BC Managed Care – PPO

## 2019-10-20 ENCOUNTER — Other Ambulatory Visit: Payer: Self-pay

## 2019-10-20 DIAGNOSIS — E7800 Pure hypercholesterolemia, unspecified: Secondary | ICD-10-CM | POA: Diagnosis not present

## 2019-10-20 DIAGNOSIS — I10 Essential (primary) hypertension: Secondary | ICD-10-CM | POA: Diagnosis not present

## 2019-10-20 LAB — MICROALBUMIN, URINE WAIVED
Creatinine, Urine Waived: 200 mg/dL (ref 10–300)
Microalb, Ur Waived: 30 mg/L — ABNORMAL HIGH (ref 0–19)
Microalb/Creat Ratio: <30 mg/g (ref <30)

## 2019-10-21 LAB — LIPID PANEL W/O CHOL/HDL RATIO
Cholesterol, Total: 205 mg/dL — ABNORMAL HIGH (ref 100–199)
HDL: 82 mg/dL (ref >39)
LDL Chol Calc (NIH): 109 mg/dL — ABNORMAL HIGH (ref 0–99)
Triglycerides: 79 mg/dL (ref 0–149)
VLDL Cholesterol Cal: 14 mg/dL (ref 5–40)

## 2019-10-21 LAB — COMPREHENSIVE METABOLIC PANEL
ALT: 17 IU/L (ref 0–44)
AST: 14 IU/L (ref 0–40)
Albumin/Globulin Ratio: 1.9 (ref 1.2–2.2)
Albumin: 4.6 g/dL (ref 3.8–4.9)
Alkaline Phosphatase: 66 IU/L (ref 39–117)
BUN/Creatinine Ratio: 14 (ref 9–20)
BUN: 14 mg/dL (ref 6–24)
Bilirubin Total: 0.6 mg/dL (ref 0.0–1.2)
CO2: 28 mmol/L (ref 20–29)
Calcium: 9.9 mg/dL (ref 8.7–10.2)
Chloride: 105 mmol/L (ref 96–106)
Creatinine, Ser: 0.97 mg/dL (ref 0.76–1.27)
GFR calc Af Amer: 102 mL/min/{1.73_m2} (ref >59)
GFR calc non Af Amer: 88 mL/min/{1.73_m2} (ref >59)
Globulin, Total: 2.4 g/dL (ref 1.5–4.5)
Glucose: 149 mg/dL — ABNORMAL HIGH (ref 65–99)
Potassium: 4.4 mmol/L (ref 3.5–5.2)
Sodium: 145 mmol/L — ABNORMAL HIGH (ref 134–144)
Total Protein: 7 g/dL (ref 6.0–8.5)

## 2019-10-26 ENCOUNTER — Other Ambulatory Visit: Payer: Self-pay

## 2019-10-26 DIAGNOSIS — I10 Essential (primary) hypertension: Secondary | ICD-10-CM

## 2019-10-26 MED ORDER — BENAZEPRIL HCL 40 MG PO TABS: 40.0000 mg | ORAL_TABLET | Freq: Every day | ORAL | 1 refills | Status: DC

## 2019-10-26 NOTE — Telephone Encounter (Signed)
Patient last seen 10/14/19 and has follow up in June 2021.

## 2019-11-02 DIAGNOSIS — M47812 Spondylosis without myelopathy or radiculopathy, cervical region: Secondary | ICD-10-CM | POA: Diagnosis not present

## 2019-11-22 ENCOUNTER — Other Ambulatory Visit: Payer: Self-pay | Admitting: Family Medicine

## 2019-11-23 ENCOUNTER — Other Ambulatory Visit: Payer: Self-pay

## 2019-11-23 ENCOUNTER — Other Ambulatory Visit: Payer: Self-pay | Admitting: Family Medicine

## 2019-11-23 MED ORDER — VENLAFAXINE HCL ER 150 MG PO CP24: 150.0000 mg | ORAL_CAPSULE | Freq: Every day | ORAL | 1 refills | Status: DC

## 2019-11-23 NOTE — Telephone Encounter (Signed)
Refill request for Effexor 150. LOV 10-14-19 and upcoming appt 04-26-20 with Dr. Wynetta Emery.

## 2019-12-23 ENCOUNTER — Ambulatory Visit: Payer: BC Managed Care – PPO | Attending: Internal Medicine

## 2019-12-23 DIAGNOSIS — Z20822 Contact with and (suspected) exposure to covid-19: Secondary | ICD-10-CM | POA: Diagnosis not present

## 2019-12-24 ENCOUNTER — Other Ambulatory Visit: Payer: Self-pay | Admitting: Family Medicine

## 2019-12-24 LAB — NOVEL CORONAVIRUS, NAA: SARS-CoV-2, NAA: NOT DETECTED

## 2020-01-11 ENCOUNTER — Encounter: Payer: Self-pay | Admitting: Family Medicine

## 2020-01-11 ENCOUNTER — Other Ambulatory Visit: Payer: Self-pay

## 2020-01-11 ENCOUNTER — Ambulatory Visit (INDEPENDENT_AMBULATORY_CARE_PROVIDER_SITE_OTHER): Payer: 59 | Admitting: Family Medicine

## 2020-01-11 VITALS — BP 145/87 | HR 109

## 2020-01-11 DIAGNOSIS — L989 Disorder of the skin and subcutaneous tissue, unspecified: Secondary | ICD-10-CM

## 2020-01-11 NOTE — Progress Notes (Signed)
2  BP (!) 145/87   Pulse (!) 109   SpO2 97%    Subjective:    Patient ID: Jesse Dunlap, male    DOB: 09/23/1966, 54 y.o.   MRN: [id number]  HPI: Jesse Dunlap is a 54 y.o. male  Chief Complaint  Patient presents with  . Hand Pain    left middle finger    LUMP Duration: couple of weeks Location:above DIP on L middle finger Onset: sudden Painful: no Discomfort: no Status:  bigger Trauma: no Redness: no Bruising: no Recent infection: no Swollen lymph nodes: no Requesting removal: no History of cancer: no Family history of cancer: no History of the same: no Associated signs and symptoms: none  Relevant past medical, surgical, family and social history reviewed and updated as indicated. Interim medical history since our last visit reviewed. Allergies and medications reviewed and updated.  Review of Systems  Constitutional: Negative.   Respiratory: Negative.   Cardiovascular: Negative.   Musculoskeletal: Negative.   Skin: Negative.        lump  Psychiatric/Behavioral: Negative.     Per HPI unless specifically indicated above     Objective:    BP (!) 145/87   Pulse (!) 109   SpO2 97%   Wt Readings from Last 3 Encounters:  08/25/19 204 lb (92.5 kg)  04/22/19 217 lb (98.4 kg)  04/08/19 215 lb (97.5 kg)    Physical Exam Constitutional:      General: He is not in acute distress.    Appearance: He is well-developed.  HENT:     Head: Normocephalic and atraumatic.     Right Ear: Hearing normal.     Left Ear: Hearing normal.     Nose: Nose normal.  Eyes:     General: Lids are normal. No scleral icterus.       Right eye: No discharge.        Left eye: No discharge.     Conjunctiva/sclera: Conjunctivae normal.  Pulmonary:     Effort: Pulmonary effort is normal. No respiratory distress.  Musculoskeletal:        General: Normal range of motion.  Skin:    Findings: No rash.     Comments: 1 cm fluctuant lump just distal to DIP on the L middle finger, no  redness or heat  Neurological:     Mental Status: He is alert and oriented to person, place, and time.  Psychiatric:        Speech: Speech normal.        Behavior: Behavior normal.        Thought Content: Thought content normal.        Judgment: Judgment normal.     Results for orders placed or performed in visit on 12/23/19  Novel Coronavirus, NAA ([facility])   Specimen: Nasopharyngeal(NP) swabs in vial transport medium   NASOPHARYNGE  TESTING  Result Value Ref Range   SARS-CoV-2, NAA Not Detected Not Detected      Assessment & Plan:   Problem List Items Addressed This Visit    None    Visit Diagnoses    Skin lesion of hand    -  Primary   Drained today as below. Call with any concerns or if returns.     Skin Procedure  Procedure: cyst drainage  Diagnosis: Cyst  Lesion Location/Size: 1 cm fluctuant lump just distal to DIP on the L middle finger, no redness or heat Physician: MJ Consent:  Risks, benefits, and alternative treatments discussed and  all questions were answered.  Patient elected to proceed and verbal consent obtained.  Description: Area prepped and draped using semi-sterile technique. Area locally anesthetized using digital block and 5cc's of lidocaine 1% plain. Cyst drained using 25guage needle and syringe. <1cc bloody fluid drained. Dressed with bandage  Post Procedure Instructions: Wound care instructions discussed and patient was instructed to keep area clean and dry.  Signs and symptoms of infection discussed, patient agrees to contact the office ASAP should they occur.  Dressing change recommended once.    Follow up plan: Return if symptoms worsen or fail to improve.

## 2020-01-20 ENCOUNTER — Telehealth: Payer: Self-pay

## 2020-01-20 DIAGNOSIS — Z1211 Encounter for screening for malignant neoplasm of colon: Secondary | ICD-10-CM

## 2020-01-20 NOTE — Telephone Encounter (Signed)
Copied from [place] [phone]. Topic: Referral - Request for Referral >> Jan 20, 2020 10:19 AM Rayann Heman wrote: Has patient seen PCP for this complaint? Yes  Referral for which specialty: GI  Preferred provider/office: Dr Oren Beckmann [facility]  Reason for referral:needs colonoscopy

## 2020-01-21 ENCOUNTER — Ambulatory Visit: Payer: No Typology Code available for payment source | Attending: Internal Medicine

## 2020-01-21 DIAGNOSIS — Z23 Encounter for immunization: Secondary | ICD-10-CM

## 2020-01-21 NOTE — Progress Notes (Signed)
   Covid-19 Vaccination Clinic  Name:  Jesse Dunlap    MRN: [id number] DOB: 02-24-1966  01/21/2020  Mr. [name] was observed post Covid-19 immunization for 15 minutes without incident. He was provided with Vaccine Information Sheet and instruction to access the V-Safe system.   Mr. [name] was instructed to call 911 with any severe reactions post vaccine: Marland Kitchen Difficulty breathing  . Swelling of face and throat  . A fast heartbeat  . A bad rash all over body  . Dizziness and weakness   Immunizations Administered    Name Date Dose VIS Date Route   Pfizer COVID-19 Vaccine 01/21/2020  8:32 AM 0.3 mL 10/16/2019 Intramuscular   Manufacturer: [place]   Lot: [id number]   [place]: [id number]

## 2020-01-21 NOTE — Addendum Note (Signed)
Addended by: Valerie Roys on: 01/21/2020 09:09 AM   Modules accepted: Orders

## 2020-01-25 ENCOUNTER — Ambulatory Visit (INDEPENDENT_AMBULATORY_CARE_PROVIDER_SITE_OTHER): Payer: Self-pay | Admitting: Gastroenterology

## 2020-01-25 VITALS — Ht 69.0 in

## 2020-01-25 DIAGNOSIS — Z1211 Encounter for screening for malignant neoplasm of colon: Secondary | ICD-10-CM

## 2020-01-25 DIAGNOSIS — Z86010 Personal history of colonic polyps: Secondary | ICD-10-CM

## 2020-01-25 MED ORDER — NA SULFATE-K SULFATE-MG SULF 17.5-3.13-1.6 GM/177ML PO SOLN: 1.0000 | Freq: Once | ORAL | 0 refills | Status: AC

## 2020-01-25 NOTE — Progress Notes (Signed)
Gastroenterology Pre-Procedure Review  Request Date: April 26th Requesting Physician: Dr. Allen Norris  PATIENT REVIEW QUESTIONS: The patient responded to the following health history questions as indicated:    1. Are you having any GI issues? no 2. Do you have a personal history of Polyps? yes (2017 Dr. Allen Norris) 3. Do you have a family history of Colon Cancer or Polyps? yes (grandmother and mother colon polyps) 4. Diabetes Mellitus? no 5. Joint replacements in the past 12 months?no 6. Major health problems in the past 3 months?no 7. Any artificial heart valves, MVP, or defibrillator?no    MEDICATIONS & ALLERGIES:    Patient reports the following regarding taking any anticoagulation/antiplatelet therapy:   Plavix, Coumadin, Eliquis, Xarelto, Lovenox, Pradaxa, Brilinta, or Effient? no Aspirin? yes (81 mg daily)  Patient confirms/reports the following medications:  Current Outpatient Medications  Medication Sig Dispense Refill  . amLODipine (NORVASC) 10 MG tablet TAKE ONE TABLET BY MOUTH DAILY 90 tablet 1  . benazepril (LOTENSIN) 40 MG tablet Take 1 tablet (40 mg total) by mouth daily. 90 tablet 1  . gabapentin (NEURONTIN) 100 MG capsule TAKE 1 CAPSULE BY MOUTH EVERY NIGHT AT BEDTIME (Patient not taking: Reported on 01/11/2020) 90 capsule 2  . venlafaxine XR (EFFEXOR-XR) 150 MG 24 hr capsule Take 1 capsule (150 mg total) by mouth daily with breakfast. 90 capsule 1   No current facility-administered medications for this visit.    Patient confirms/reports the following allergies:  Allergies  Allergen Reactions  . Tramadol Itching    No orders of the defined types were placed in this encounter.   AUTHORIZATION INFORMATION Primary Insurance: 1D#: Group #:  Secondary Insurance: 1D#: Group #:  SCHEDULE INFORMATION: Date: April 26th Time: [place]

## 2020-01-30 ENCOUNTER — Ambulatory Visit: Payer: Self-pay

## 2020-02-16 ENCOUNTER — Ambulatory Visit: Payer: Managed Care, Other (non HMO) | Attending: Internal Medicine

## 2020-02-16 DIAGNOSIS — Z23 Encounter for immunization: Secondary | ICD-10-CM

## 2020-02-16 NOTE — Progress Notes (Signed)
   Covid-19 Vaccination Clinic  Name:  Jesse Dunlap    MRN: [id number] DOB: 06-23-1966  02/16/2020  [name] was observed post Covid-19 immunization for 15 minutes without incident. He was provided with Vaccine Information Sheet and instruction to access the V-Safe system.   [name] was instructed to call 911 with any severe reactions post vaccine: Marland Kitchen Difficulty breathing  . Swelling of face and throat  . A fast heartbeat  . A bad rash all over body  . Dizziness and weakness   Immunizations Administered    Name Date Dose VIS Date Route   Pfizer COVID-19 Vaccine 02/16/2020  8:38 AM 0.3 mL 10/16/2019 Intramuscular   Manufacturer: [place]   Lot: [id number]   [place]: [id number]

## 2020-02-18 ENCOUNTER — Other Ambulatory Visit: Payer: Self-pay

## 2020-02-18 ENCOUNTER — Encounter: Payer: Self-pay | Admitting: Gastroenterology

## 2020-02-25 ENCOUNTER — Other Ambulatory Visit
Admission: RE | Admit: 2020-02-25 | Discharge: 2020-02-25 | Disposition: A | Discharge status: Home / Self Care | Payer: Managed Care, Other (non HMO) | Source: Ambulatory Visit | Attending: Gastroenterology | Admitting: Gastroenterology

## 2020-02-25 ENCOUNTER — Other Ambulatory Visit: Payer: Self-pay

## 2020-02-25 DIAGNOSIS — Z20822 Contact with and (suspected) exposure to covid-19: Secondary | ICD-10-CM | POA: Insufficient documentation

## 2020-02-25 DIAGNOSIS — Z01812 Encounter for preprocedural laboratory examination: Secondary | ICD-10-CM | POA: Diagnosis present

## 2020-02-25 LAB — SARS CORONAVIRUS 2 (TAT 6-24 HRS): SARS Coronavirus 2: NEGATIVE

## 2020-02-26 NOTE — Discharge Instructions (Signed)
General Anesthesia, Adult, Care After This sheet gives you information about how to care for yourself after your procedure. Your health care provider may also give you more specific instructions. If you have problems or questions, contact your health care provider. What can I expect after the procedure? After the procedure, the following side effects are common:  Pain or discomfort at the IV site.  Nausea.  Vomiting.  Sore throat.  Trouble concentrating.  Feeling cold or chills.  Weak or tired.  Sleepiness and fatigue.  Soreness and body aches. These side effects can affect parts of the body that were not involved in surgery. Follow these instructions at home:  For at least 24 hours after the procedure:  Have a responsible adult stay with you. It is important to have someone help care for you until you are awake and alert.  Rest as needed.  Do not: ? Participate in activities in which you could fall or become injured. ? Drive. ? Use heavy machinery. ? Drink alcohol. ? Take sleeping pills or medicines that cause drowsiness. ? Make important decisions or sign legal documents. ? Take care of children on your own. Eating and drinking  Follow any instructions from your health care provider about eating or drinking restrictions.  When you feel hungry, start by eating small amounts of foods that are soft and easy to digest (bland), such as toast. Gradually return to your regular diet.  Drink enough fluid to keep your urine pale yellow.  If you vomit, rehydrate by drinking water, juice, or clear broth. General instructions  If you have sleep apnea, surgery and certain medicines can increase your risk for breathing problems. Follow instructions from your health care provider about wearing your sleep device: ? Anytime you are sleeping, including during daytime naps. ? While taking prescription pain medicines, sleeping medicines, or medicines that make you drowsy.  Return to  your normal activities as told by your health care provider. Ask your health care provider what activities are safe for you.  Take over-the-counter and prescription medicines only as told by your health care provider.  If you smoke, do not smoke without supervision.  Keep all follow-up visits as told by your health care provider. This is important. Contact a health care provider if:  You have nausea or vomiting that does not get better with medicine.  You cannot eat or drink without vomiting.  You have pain that does not get better with medicine.  You are unable to pass urine.  You develop a skin rash.  You have a fever.  You have redness around your IV site that gets worse. Get help right away if:  You have difficulty breathing.  You have chest pain.  You have blood in your urine or stool, or you vomit blood. Summary  After the procedure, it is common to have a sore throat or nausea. It is also common to feel tired.  Have a responsible adult stay with you for the first 24 hours after general anesthesia. It is important to have someone help care for you until you are awake and alert.  When you feel hungry, start by eating small amounts of foods that are soft and easy to digest (bland), such as toast. Gradually return to your regular diet.  Drink enough fluid to keep your urine pale yellow.  Return to your normal activities as told by your health care provider. Ask your health care provider what activities are safe for you. This information is not   intended to replace advice given to you by your health care provider. Make sure you discuss any questions you have with your health care provider. Document Revised: 10/25/2017 Document Reviewed: 06/07/2017 Elsevier Patient Education  2020 Elsevier Inc.  

## 2020-02-29 ENCOUNTER — Encounter
Admission: RE | Disposition: A | Discharge status: Home / Self Care | Payer: Self-pay | Source: Home / Self Care | Attending: Gastroenterology

## 2020-02-29 ENCOUNTER — Other Ambulatory Visit: Payer: Self-pay

## 2020-02-29 ENCOUNTER — Ambulatory Visit
Admission: RE | Admit: 2020-02-29 | Discharge: 2020-02-29 | Disposition: A | Discharge status: Home / Self Care | Payer: Managed Care, Other (non HMO) | Attending: Gastroenterology | Admitting: Gastroenterology

## 2020-02-29 ENCOUNTER — Ambulatory Visit: Payer: Managed Care, Other (non HMO) | Admitting: Anesthesiology

## 2020-02-29 ENCOUNTER — Encounter: Payer: Self-pay | Admitting: Gastroenterology

## 2020-02-29 DIAGNOSIS — Z885 Allergy status to narcotic agent status: Secondary | ICD-10-CM | POA: Diagnosis not present

## 2020-02-29 DIAGNOSIS — Z860100 Personal history of colon polyps, unspecified: Secondary | ICD-10-CM

## 2020-02-29 DIAGNOSIS — D123 Benign neoplasm of transverse colon: Secondary | ICD-10-CM | POA: Diagnosis not present

## 2020-02-29 DIAGNOSIS — Z6829 Body mass index (BMI) 29.0-29.9, adult: Secondary | ICD-10-CM | POA: Insufficient documentation

## 2020-02-29 DIAGNOSIS — K621 Rectal polyp: Secondary | ICD-10-CM

## 2020-02-29 DIAGNOSIS — Z79899 Other long term (current) drug therapy: Secondary | ICD-10-CM | POA: Insufficient documentation

## 2020-02-29 DIAGNOSIS — F988 Other specified behavioral and emotional disorders with onset usually occurring in childhood and adolescence: Secondary | ICD-10-CM | POA: Insufficient documentation

## 2020-02-29 DIAGNOSIS — Z7982 Long term (current) use of aspirin: Secondary | ICD-10-CM | POA: Diagnosis not present

## 2020-02-29 DIAGNOSIS — E669 Obesity, unspecified: Secondary | ICD-10-CM | POA: Diagnosis not present

## 2020-02-29 DIAGNOSIS — E785 Hyperlipidemia, unspecified: Secondary | ICD-10-CM | POA: Diagnosis not present

## 2020-02-29 DIAGNOSIS — I10 Essential (primary) hypertension: Secondary | ICD-10-CM | POA: Diagnosis not present

## 2020-02-29 DIAGNOSIS — K635 Polyp of colon: Secondary | ICD-10-CM | POA: Diagnosis not present

## 2020-02-29 DIAGNOSIS — Z1211 Encounter for screening for malignant neoplasm of colon: Secondary | ICD-10-CM | POA: Diagnosis not present

## 2020-02-29 DIAGNOSIS — Z87891 Personal history of nicotine dependence: Secondary | ICD-10-CM | POA: Insufficient documentation

## 2020-02-29 DIAGNOSIS — Z9884 Bariatric surgery status: Secondary | ICD-10-CM | POA: Diagnosis not present

## 2020-02-29 HISTORY — PX: COLONOSCOPY WITH PROPOFOL: SHX5780

## 2020-02-29 HISTORY — PX: POLYPECTOMY: SHX5525

## 2020-02-29 SURGERY — COLONOSCOPY WITH PROPOFOL
Anesthesia: General | Site: Rectum

## 2020-02-29 MED ORDER — PROPOFOL 10 MG/ML IV BOLUS
INTRAVENOUS | Status: DC | PRN
  Administered 2020-02-29: 120 mg via INTRAVENOUS
  Administered 2020-02-29 (×4): 40 mg via INTRAVENOUS

## 2020-02-29 MED ORDER — ONDANSETRON HCL 4 MG/2ML IJ SOLN: 4.0000 mg | Freq: Once | INTRAMUSCULAR | Status: DC | PRN

## 2020-02-29 MED ORDER — STERILE WATER FOR IRRIGATION IR SOLN
Status: DC | PRN
  Administered 2020-02-29: 50 mL

## 2020-02-29 MED ORDER — LACTATED RINGERS IV SOLN: INTRAVENOUS | Status: DC

## 2020-02-29 MED ORDER — ACETAMINOPHEN 160 MG/5ML PO SOLN: 325.0000 mg | ORAL | Status: DC | PRN

## 2020-02-29 MED ORDER — LIDOCAINE HCL (CARDIAC) PF 100 MG/5ML IV SOSY
PREFILLED_SYRINGE | INTRAVENOUS | Status: DC | PRN
  Administered 2020-02-29: 30 mg via INTRAVENOUS

## 2020-02-29 MED ORDER — ACETAMINOPHEN 325 MG PO TABS: 325.0000 mg | ORAL_TABLET | ORAL | Status: DC | PRN

## 2020-02-29 SURGICAL SUPPLY — 7 items
GAUZE SPONGE 4X4 12PLY STRL (GAUZE/BANDAGES/DRESSINGS) ×3 IMPLANT
GOWN CVR UNV OPN BCK APRN NK (MISCELLANEOUS) ×2 IMPLANT
GOWN ISOL THUMB LOOP REG UNIV (MISCELLANEOUS) ×4
KIT ENDO PROCEDURE OLY (KITS) ×3 IMPLANT
SNARE SHORT THROW 13M SML OVAL (MISCELLANEOUS) ×3 IMPLANT
TRAP ETRAP POLY (MISCELLANEOUS) ×3 IMPLANT
WATER STERILE IRR 250ML POUR (IV SOLUTION) ×3 IMPLANT

## 2020-02-29 NOTE — Op Note (Signed)
[facility] Gastroenterology Patient Name: Jesse Dunlap Procedure Date: 02/29/2020 10:09 AM MRN: [id number] Account #: [id number] Date of Birth: [birth year] Admit Type: Outpatient Age: 54 Room: [facility] OR ROOM 01 Gender: Male Note Status: Finalized Procedure:             Colonoscopy Indications:           High risk colon cancer surveillance: Personal history                         of colonic polyps Providers:             Lucilla Lame MD, MD Referring MD:          Valerie Roys (Referring MD) Medicines:             Propofol per Anesthesia Complications:         No immediate complications. Procedure:             Pre-Anesthesia Assessment:                        - Prior to the procedure, a History and Physical was                         performed, and patient medications and allergies were                         reviewed. The patient's tolerance of previous                         anesthesia was also reviewed. The risks and benefits                         of the procedure and the sedation options and risks                         were discussed with the patient. All questions were                         answered, and informed consent was obtained. Prior                         Anticoagulants: The patient has taken no previous                         anticoagulant or antiplatelet agents. ASA Grade                         Assessment: II - A patient with mild systemic disease.                         After reviewing the risks and benefits, the patient                         was deemed in satisfactory condition to undergo the                         procedure.  After obtaining informed consent, the colonoscope was                         passed under direct vision. Throughout the procedure,                         the patient's blood pressure, pulse, and oxygen                         saturations were monitored continuously. The was                  introduced through the anus and advanced to the the                         cecum, identified by appendiceal orifice and ileocecal                         valve. The colonoscopy was performed without                         difficulty. The patient tolerated the procedure well.                         The quality of the bowel preparation was excellent. Findings:      The perianal and digital rectal examinations were normal.      A 4 mm polyp was found in the rectum. The polyp was sessile. The polyp       was removed with a cold snare. Resection and retrieval were complete.      A 4 mm polyp was found in the transverse colon. The polyp was sessile.       The polyp was removed with a cold snare. Resection and retrieval were       complete. Impression:            - One 4 mm polyp in the rectum, removed with a cold                         snare. Resected and retrieved.                        - One 4 mm polyp in the transverse colon, removed with                         a cold snare. Resected and retrieved. Recommendation:        - Discharge patient to home.                        - Resume previous diet. Procedure Code(s):     --- Professional ---                        [phone], Colonoscopy, flexible; with removal of                         tumor(s), polyp(s), or other lesion(s) by snare                         technique Diagnosis Code(s):     ---  Professional ---                        Z86.010, Personal history of colonic polyps                        K62.1, Rectal polyp                        K63.5, Polyp of colon CPT copyright 2019 American Medical Association. All rights reserved. The codes documented in this report are preliminary and upon coder review may  be revised to meet current compliance requirements. Lucilla Lame MD, MD 02/29/2020 10:32:25 AM This report has been signed electronically. Number of Addenda: 0 Note Initiated On: 02/29/2020 10:09 AM Scope Withdrawal  Time: 0 hours 11 minutes 16 seconds  Total Procedure Duration: 0 hours 13 minutes 40 seconds  Estimated Blood Loss:  Estimated blood loss: none.      [facility]

## 2020-02-29 NOTE — Anesthesia Procedure Notes (Signed)
Performed by: Bueford Arp, CRNA Pre-anesthesia Checklist: Patient identified, Emergency Drugs available, Suction available, Timeout performed and Patient being monitored Patient Re-evaluated:Patient Re-evaluated prior to induction Oxygen Delivery Method: Nasal cannula Placement Confirmation: positive ETCO2       

## 2020-02-29 NOTE — Anesthesia Postprocedure Evaluation (Signed)
Anesthesia Post Note  Patient: Jesse Dunlap  Procedure(s) Performed: COLONOSCOPY WITH BIOPSYr (N/A Rectum) POLYPECTOMY (N/A Rectum)     Patient location during evaluation: PACU Anesthesia Type: General Level of consciousness: awake and alert Pain management: pain level controlled Vital Signs Assessment: post-procedure vital signs reviewed and stable Respiratory status: spontaneous breathing, nonlabored ventilation, respiratory function stable and patient connected to nasal cannula oxygen Cardiovascular status: blood pressure returned to baseline and stable Postop Assessment: no apparent nausea or vomiting Anesthetic complications: no    Adele Barthel Casia Corti

## 2020-02-29 NOTE — Transfer of Care (Signed)
Immediate Anesthesia Transfer of Care Note  Patient: Jesse Dunlap  Procedure(s) Performed: COLONOSCOPY WITH BIOPSYr (N/A Rectum) POLYPECTOMY (N/A Rectum)  Patient Location: PACU  Anesthesia Type: General  Level of Consciousness: awake, alert  and patient cooperative  Airway and Oxygen Therapy: Patient Spontanous Breathing and Patient connected to supplemental oxygen  Post-op Assessment: Post-op Vital signs reviewed, Patient's Cardiovascular Status Stable, Respiratory Function Stable, Patent Airway and No signs of Nausea or vomiting  Post-op Vital Signs: Reviewed and stable  Complications: No apparent anesthesia complications

## 2020-02-29 NOTE — H&P (Signed)
Lucilla Lame, MD [address] [place], [place] [address] Phone:[phone] Fax : [phone]  Primary Care Physician:  Valerie Roys, DO Primary Gastroenterologist:  Dr. Allen Norris  Pre-Procedure History & Physical: HPI:  Jesse Dunlap is a 54 y.o. male is here for an colonoscopy.   Past Medical History:  Diagnosis Date  . Attention deficit disorder   . Diabetes mellitus without complication ([place])    prior to gastric sleeve and wt loss  . Family history of breast cancer   . Hyperlipidemia   . Hypertension    prior to gastric sleeve and wt loss  . Hypogonadism in male   . Obesity (BMI 35.0-39.9 without comorbidity)   . OSA on CPAP     Past Surgical History:  Procedure Laterality Date  . CARDIAC CATHETERIZATION  approx 2000  . COLONOSCOPY  approx 2005   had polyp  . COLONOSCOPY WITH PROPOFOL N/A 03/16/2016   Procedure: COLONOSCOPY WITH PROPOFOL;  Surgeon: Lucilla Lame, MD;  Location: [facility];  Service: Endoscopy;  Laterality: N/A;  CPAP  . LAPAROSCOPIC GASTRIC SLEEVE RESECTION  04/2013  . POLYPECTOMY  03/16/2016   Procedure: POLYPECTOMY;  Surgeon: Lucilla Lame, MD;  Location: [facility];  Service: Endoscopy;;  . RHINOPLASTY      Prior to Admission medications   Medication Sig Start Date End Date Taking? Authorizing Provider  amLODipine (NORVASC) 10 MG tablet TAKE ONE TABLET BY MOUTH DAILY 11/23/19  Yes Johnson, Megan P, DO  aspirin EC 81 MG tablet Take 81 mg by mouth daily.   Yes [provider]  benazepril (LOTENSIN) 40 MG tablet Take 1 tablet (40 mg total) by mouth daily. 10/26/19  Yes Johnson, Megan P, DO  venlafaxine XR (EFFEXOR-XR) 150 MG 24 hr capsule Take 1 capsule (150 mg total) by mouth daily with breakfast. 11/23/19  Yes Johnson, Megan P, DO  gabapentin (NEURONTIN) 100 MG capsule TAKE 1 CAPSULE BY MOUTH EVERY NIGHT AT BEDTIME Patient not taking: Reported on 02/18/2020 12/24/19   Park Liter P, DO    Allergies as of  01/25/2020 - Review Complete 01/25/2020  Allergen Reaction Noted  . Tramadol Itching 03/07/2016    Family History  Problem Relation Age of Onset  . Diabetes Mother   . Breast cancer Mother 69       CHEK2 pos  . Diabetes Father   . Hypertension Father   . Skin cancer Father   . Breast cancer Sister 38  . Alzheimer's disease Maternal Grandmother   . Breast cancer Maternal Grandmother        dx over 41  . Prostate cancer Maternal Grandfather        dx in his 65s-70s  . Lung cancer Paternal Grandfather   . Prostate cancer Other        PGF's father    Social History   Socioeconomic History  . Marital status: Married    Spouse name: Not on file  . Number of children: Not on file  . Years of education: Not on file  . Highest education level: Not on file  Occupational History  . Not on file  Tobacco Use  . Smoking status: Former Smoker    Packs/day: 1.50    Years: 10.00    Pack years: 15.00    Types: Cigarettes    Quit date: 11/05/1986    Years since quitting: 33.3  . Smokeless tobacco: Never Used  Substance and Sexual Activity  . Alcohol use: Yes  Alcohol/week: 4.0 - 5.0 standard drinks    Types: 4 - 5 Glasses of wine per week    Comment:    . Drug use: No  . Sexual activity: Not on file  Other Topics Concern  . Not on file  Social History Narrative  . Not on file   Social Determinants of Health   Financial Resource Strain:   . Difficulty of Paying Living Expenses:   Food Insecurity:   . Worried About Charity fundraiser in the Last Year:   . Arboriculturist in the Last Year:   Transportation Needs:   . Film/video editor (Medical):   Marland Kitchen Lack of Transportation (Non-Medical):   Physical Activity:   . Days of Exercise per Week:   . Minutes of Exercise per Session:   Stress:   . Feeling of Stress :   Social Connections:   . Frequency of Communication with Friends and Family:   . Frequency of Social Gatherings with Friends and Family:   . Attends  Religious Services:   . Active Member of Clubs or Organizations:   . Attends Archivist Meetings:   Marland Kitchen Marital Status:   Intimate Partner Violence:   . Fear of Current or Ex-Partner:   . Emotionally Abused:   Marland Kitchen Physically Abused:   . Sexually Abused:     Review of Systems: See HPI, otherwise negative ROS  Physical Exam: BP (!) 153/88   Pulse 75   Temp (!) 97.2 F (36.2 C) (Temporal)   Resp 18   Ht 5' 9"  (1.753 m)   Wt 92.1 kg   SpO2 100%   BMI 29.98 kg/m  General:   Alert,  pleasant and cooperative in NAD Head:  Normocephalic and atraumatic. Neck:  Supple; no masses or thyromegaly. Lungs:  Clear throughout to auscultation.    Heart:  Regular rate and rhythm. Abdomen:  Soft, nontender and nondistended. Normal bowel sounds, without guarding, and without rebound.   Neurologic:  Alert and  oriented x4;  grossly normal neurologically.  Impression/Plan: Graylon Amory is here for an colonoscopy to be performed for adenomatous polyps 03/16/2016  Risks, benefits, limitations, and alternatives regarding  colonoscopy have been reviewed with the patient.  Questions have been answered.  All parties agreeable.   Lucilla Lame, MD  02/29/2020, 10:07 AM

## 2020-02-29 NOTE — Anesthesia Preprocedure Evaluation (Signed)
Anesthesia Evaluation  Patient identified by MRN, date of birth, ID band Patient awake    History of Anesthesia Complications Negative for: history of anesthetic complications  Airway Mallampati: II  TM Distance: >3 FB Neck ROM: Full    Dental no notable dental hx.    Pulmonary former smoker,    Pulmonary exam normal        Cardiovascular Exercise Tolerance: Good hypertension, (-) anginaNormal cardiovascular exam     Neuro/Psych    GI/Hepatic negative GI ROS, Neg liver ROS,   Endo/Other  diabetes  Renal/GU negative Renal ROS     Musculoskeletal   Abdominal   Peds  Hematology negative hematology ROS (+)   Anesthesia Other Findings   Reproductive/Obstetrics                             Anesthesia Physical Anesthesia Plan  ASA: II  Anesthesia Plan: General   Post-op Pain Management:    Induction: Intravenous  PONV Risk Score and Plan: 2 and TIVA, Propofol infusion and Treatment may vary due to age or medical condition  Airway Management Planned: Nasal Cannula and Natural Airway  Additional Equipment: None  Intra-op Plan:   Post-operative Plan:   Informed Consent: I have reviewed the patients History and Physical, chart, labs and discussed the procedure including the risks, benefits and alternatives for the proposed anesthesia with the patient or authorized representative who has indicated his/her understanding and acceptance.       Plan Discussed with: CRNA  Anesthesia Plan Comments:         Anesthesia Quick Evaluation

## 2020-03-01 ENCOUNTER — Encounter: Payer: Self-pay | Admitting: Gastroenterology

## 2020-03-01 ENCOUNTER — Encounter: Payer: Self-pay | Admitting: *Deleted

## 2020-03-01 LAB — SURGICAL PATHOLOGY

## 2020-04-25 NOTE — Progress Notes (Signed)
BP (!) 158/88 (BP Location: Left Arm, Cuff Size: Normal)   Pulse 92   Temp 98.8 F (37.1 C) (Oral)   Ht 5' 9.49" (1.765 m)   Wt 209 lb (94.8 kg)   SpO2 98%   BMI 30.43 kg/m    Subjective:    Patient ID: [place], male    DOB: [birth year], 54 y.o.   MRN: [id number]  HPI: Braycen Burandt is a 54 y.o. male presenting on 04/26/2020 for comprehensive medical examination. Current medical complaints include:  HYPERTENSION / HYPERLIPIDEMIA Satisfied with current treatment? yes Duration of hypertension: chronic BP monitoring frequency: not checking 1BP medication side effects: no Past BP meds: amlodipine, benazepril Duration of hyperlipidemia: chronic Cholesterol medication side effects: not on anything Cholesterol supplements: none Medication compliance: excellent compliance Aspirin: yes Recent stressors: no Recurrent headaches: no Visual changes: no Palpitations: no Dyspnea: no Chest pain: no Lower extremity edema: no Dizzy/lightheaded: no  LOW TESTOSTERONE Duration: chronic Status: stable  Satisfied with current treatment:  yes Medication side effects:  Not on anything Decreased libido: no Fatigue: no Depressed mood: no Muscle weakness: no Erectile dysfunction: no  DEPRESSION Mood status: controlled Satisfied with current treatment?: yes Symptom severity: mild  Duration of current treatment : chronic Side effects: no Medication compliance: excellent compliance Psychotherapy/counseling: no  Previous psychiatric medications: effexor Depressed mood: no Anxious mood: no Anhedonia: no Significant weight loss or gain: no Insomnia: no  Fatigue: no Feelings of worthlessness or guilt: no Impaired concentration/indecisiveness: no Suicidal ideations: no Hopelessness: no Crying spells: no Depression screen [facility] 2/9 04/26/2020 10/14/2019 08/25/2019 04/22/2019 04/08/2019  Decreased Interest 0 0 _0 Down, Depressed, Hopeless 0 0 _1 PHQ - 2 Score 0 0 _2 Altered sleeping 1 0 _3 Tired, decreased energy 0 0 _4 Change in appetite 0 0 _5 Feeling bad or failure about yourself  _6 Trouble concentrating 0 0 _7 Moving slowly or fidgety/restless 0 0 0 0 0  Suicidal thoughts 0 0 0 0 0  PHQ-9 Score _8 Difficult doing work/chores Not difficult at all Not difficult at all Somewhat difficult - -  Some recent data might be hidden   Interim Problems from his last visit: no  Depression Screen done today and results listed below:  Depression screen [facility] 2/9 04/26/2020 10/14/2019 08/25/2019 04/22/2019 04/08/2019  Decreased Interest 0 0 _9 Down, Depressed, Hopeless 0 0 _10 PHQ - 2 Score 0 0 _11 Altered sleeping 1 0 _12 Tired, decreased energy 0 0 _13 Change in appetite 0 0 _14 Feeling bad or failure about yourself  _15 Trouble concentrating 0 0 _16 Moving slowly or fidgety/restless 0 0 0 0 0  Suicidal thoughts 0 0 0 0 0  PHQ-9 Score _17 Difficult doing work/chores Not difficult at all Not difficult at all Somewhat difficult - -  Some recent data might be hidden     Past Medical History:  Past Medical History:  Diagnosis Date  . Attention deficit disorder   . Diabetes mellitus without complication ([place])    prior to gastric sleeve and wt loss  . Family history of breast cancer   . Hyperlipidemia   .  Hypertension    prior to gastric sleeve and wt loss  . Hypogonadism in male   . Obesity (BMI 35.0-39.9 without comorbidity)   . OSA on CPAP     Surgical History:  Past Surgical History:  Procedure Laterality Date  . CARDIAC CATHETERIZATION  approx 2000  . COLONOSCOPY  approx 2005   had polyp  . COLONOSCOPY WITH PROPOFOL N/A 03/16/2016   Procedure: COLONOSCOPY WITH PROPOFOL;  Surgeon: Lucilla Lame, MD;  Location: [place];  Service: Endoscopy;  Laterality: N/A;  CPAP  . COLONOSCOPY WITH PROPOFOL N/A 02/29/2020   Procedure: COLONOSCOPY WITH BIOPSYr;  Surgeon: Lucilla Lame, MD;  Location: [place];  Service: Endoscopy;  Laterality: N/A;  sleep apnea Priority 3  . LAPAROSCOPIC GASTRIC SLEEVE RESECTION  04/2013  . POLYPECTOMY  03/16/2016   Procedure: POLYPECTOMY;  Surgeon: Lucilla Lame, MD;  Location: [place];  Service: Endoscopy;;  . POLYPECTOMY N/A 02/29/2020   Procedure: POLYPECTOMY;  Surgeon: Lucilla Lame, MD;  Location: [place];  Service: Endoscopy;  Laterality: N/A;  . RHINOPLASTY      Medications:  Current Outpatient Medications on File Prior to Visit  Medication Sig  . aspirin EC 81 MG tablet Take 81 mg by mouth daily.   No current facility-administered medications on file prior to visit.    Allergies:  Allergies  Allergen Reactions  . Tramadol Itching    Social History:  Social History   Socioeconomic History  . Marital status: Married    Spouse name: Not on file  . Number of children: Not on file  . Years of education: Not on file  . Highest education level: Not on file  Occupational History  . Not on file  Tobacco Use  . Smoking status: Former Smoker    Packs/day: 1.50    Years: 10.00    Pack years: 15.00    Types: Cigarettes    Quit date: 11/05/1986    Years since quitting: 33.4  . Smokeless tobacco: Never Used  Vaping Use  . Vaping Use: Never used  Substance and Sexual Activity  . Alcohol use: Yes    Alcohol/week: 4.0 - 5.0 standard drinks    Types: 4 - 5 Glasses of wine per week    Comment:    . Drug use: No  . Sexual activity: Not on file  Other Topics Concern  . Not on file  Social History Narrative  . Not on file   Social Determinants of Health   Financial Resource Strain:   . Difficulty of Paying Living Expenses:   Food Insecurity:   . Worried About Charity fundraiser in the Last Year:   . Arboriculturist in the Last Year:   Transportation Needs:   . Film/video editor (Medical):   Marland Kitchen Lack of Transportation (Non-Medical):   Physical Activity:   . Days of  Exercise per Week:   . Minutes of Exercise per Session:   Stress:   . Feeling of Stress :   Social Connections:   . Frequency of Communication with Friends and Family:   . Frequency of Social Gatherings with Friends and Family:   . Attends Religious Services:   . Active Member of Clubs or Organizations:   . Attends Archivist Meetings:   Marland Kitchen Marital Status:   Intimate Partner Violence:   . Fear of Current or Ex-Partner:   . Emotionally Abused:   Marland Kitchen Physically Abused:   . Sexually Abused:  Social History   Tobacco Use  Smoking Status Former Smoker  . Packs/day: 1.50  . Years: 10.00  . Pack years: 15.00  . Types: Cigarettes  . Quit date: 11/05/1986  . Years since quitting: 33.4  Smokeless Tobacco Never Used   Social History   Substance and Sexual Activity  Alcohol Use Yes  . Alcohol/week: 4.0 - 5.0 standard drinks  . Types: 4 - 5 Glasses of wine per week   Comment:      Family History:  Family History  Problem Relation Age of Onset  . Diabetes Mother   . Breast cancer Mother 28       CHEK2 pos  . Diabetes Father   . Hypertension Father   . Skin cancer Father   . Breast cancer Sister 25  . Alzheimer's disease Maternal Grandmother   . Breast cancer Maternal Grandmother        dx over 79  . Prostate cancer Maternal Grandfather        dx in his 20s-70s  . Lung cancer Paternal Grandfather   . Prostate cancer Other        PGF's father    Past medical history, surgical history, medications, allergies, family history and social history reviewed with patient today and changes made to appropriate areas of the chart.   Review of Systems  Constitutional: Negative.   HENT: Negative.   Eyes: Negative.   Respiratory: Negative.   Cardiovascular: Negative.   Gastrointestinal: Negative.   Genitourinary: Negative.   Musculoskeletal: Negative.   Skin: Negative.   Neurological: Positive for tingling (in his ear from his neck). Negative for dizziness, tremors,  sensory change, speech change, focal weakness, seizures, loss of consciousness, weakness and headaches.  Endo/Heme/Allergies: Negative.   Psychiatric/Behavioral: Negative.     All other ROS negative except what is listed above and in the HPI.      Objective:    BP (!) 158/88 (BP Location: Left Arm, Cuff Size: Normal)   Pulse 92   Temp 98.8 F (37.1 C) (Oral)   Ht 5' 9.49" (1.765 m)   Wt 209 lb (94.8 kg)   SpO2 98%   BMI 30.43 kg/m   Wt Readings from Last 3 Encounters:  04/26/20 209 lb (94.8 kg)  02/29/20 203 lb (92.1 kg)  08/25/19 204 lb (92.5 kg)    Physical Exam Vitals and nursing note reviewed.  Constitutional:      General: He is not in acute distress.    Appearance: Normal appearance. He is obese. He is not ill-appearing, toxic-appearing or diaphoretic.  HENT:     Head: Normocephalic and atraumatic.     Right Ear: Tympanic membrane, ear canal and external ear normal. There is no impacted cerumen.     Left Ear: Tympanic membrane, ear canal and external ear normal. There is no impacted cerumen.     Nose: Nose normal. No congestion or rhinorrhea.     Mouth/Throat:     Mouth: Mucous membranes are moist.     Pharynx: Oropharynx is clear. No oropharyngeal exudate or posterior oropharyngeal erythema.  Eyes:     General: No scleral icterus.       Right eye: No discharge.        Left eye: No discharge.     Extraocular Movements: Extraocular movements intact.     Conjunctiva/sclera: Conjunctivae normal.     Pupils: Pupils are equal, round, and reactive to light.  Neck:     Vascular: No carotid bruit.  Cardiovascular:  Rate and Rhythm: Normal rate and regular rhythm.     Pulses: Normal pulses.     Heart sounds: No murmur heard.  No friction rub. No gallop.   Pulmonary:     Effort: Pulmonary effort is normal. No respiratory distress.     Breath sounds: Normal breath sounds. No stridor. No wheezing, rhonchi or rales.  Chest:     Chest wall: No tenderness.    Abdominal:     General: Abdomen is flat. Bowel sounds are normal. There is no distension.     Palpations: Abdomen is soft. There is no mass.     Tenderness: There is no abdominal tenderness. There is no right CVA tenderness, left CVA tenderness, guarding or rebound.     Hernia: No hernia is present.  Genitourinary:    Comments: Genital exam deferred with shared decision making Musculoskeletal:        General: No swelling, tenderness, deformity or signs of injury.     Cervical back: Normal range of motion and neck supple. No rigidity. No muscular tenderness.     Right lower leg: No edema.     Left lower leg: No edema.  Lymphadenopathy:     Cervical: No cervical adenopathy.  Skin:    General: Skin is warm and dry.     Capillary Refill: Capillary refill takes less than 2 seconds.     Coloration: Skin is not jaundiced or pale.     Findings: No bruising, erythema, lesion or rash.  Neurological:     General: No focal deficit present.     Mental Status: He is alert and oriented to person, place, and time.     Cranial Nerves: No cranial nerve deficit.     Sensory: No sensory deficit.     Motor: No weakness.     Coordination: Coordination normal.     Gait: Gait normal.     Deep Tendon Reflexes: Reflexes normal.  Psychiatric:        Mood and Affect: Mood normal.        Behavior: Behavior normal.        Thought Content: Thought content normal.        Judgment: Judgment normal.     Results for orders placed or performed during the hospital encounter of 02/29/20  Surgical pathology  Result Value Ref Range   SURGICAL PATHOLOGY      SURGICAL PATHOLOGY CASE: [id number] PATIENT: Jesse [place] Surgical Pathology Report     Specimen Submitted: A. Colon polyp, transverse; cold snare B. Rectum polyp; cold snare  Clinical History: Personal history colon polyps, screening colonoscopy. Colon polyp.    DIAGNOSIS: A. COLON POLYP, TRANSVERSE; COLD SNARE: - TUBULAR ADENOMA. -  NEGATIVE FOR HIGH-GRADE DYSPLASIA AND MALIGNANCY.  B. RECTAL POLYP; COLD SNARE: - HYPERPLASTIC POLYP. - NEGATIVE FOR DYSPLASIA AND MALIGNANCY.  GROSS DESCRIPTION: A. Labeled: Transverse colon polyp x1 cold snare Received: Formalin Tissue fragment(s): 1 Size: 0.7 cm Description: Tan soft tissue fragment Entirely submitted in 1 cassette.  B. Labeled: Rectal polyp x1 cold snare Received: Formalin Tissue fragment(s): 1 Size: 0.3 cm Description: Tan soft tissue fragment Entirely submitted in 1 cassette.   Final Diagnosis performed by Allena Napoleon, MD.   Electronically signed 03/01/2020 9:13:00AM The electronic sign ature indicates that the named Attending Pathologist has evaluated the specimen Technical component performed at [facility], [address] [place], [place] [address] Lab: [phone] Dir: Rush Farmer, MD, MMM  Professional component performed at [facility], [facility], [place],  [place], [place] [address] Lab: [phone] Dir: Dellia Nims. Reuel Derby, MD       Assessment & Plan:   Problem List Items Addressed This Visit      Cardiovascular and Mediastinum   Hypertension    Running a little high. Will monitor at home and call for BPs consistently in the 332R systolic. Will work on [employer]. Continue to monitor. Labs drawn today. Refills given.       Relevant Medications   benazepril (LOTENSIN) 40 MG tablet   amLODipine (NORVASC) 10 MG tablet   Other Relevant Orders   CBC with Differential/Platelet   Comprehensive metabolic panel   Microalbumin, Urine Waived   TSH   Urinalysis, Routine w reflex microscopic     Endocrine   Hypogonadism in male    Rechecking labs today. Await results. Treat as needed.       Relevant Orders   CBC with Differential/Platelet   Comprehensive metabolic panel   PSA   Urinalysis, Routine w reflex microscopic   Testosterone, free, total([facility])     Other   Depression, major, single episode,  moderate ([facility])    Under good control on current regimen. Continue current regimen. Continue to monitor. Call with any concerns. Refills given.       Relevant Medications   venlafaxine XR (EFFEXOR-XR) 150 MG 24 hr capsule   Other Relevant Orders   CBC with Differential/Platelet   Comprehensive metabolic panel   TSH   Hypercholesteremia    Rechecking labs today. Await results. Treat as needed.       Relevant Medications   benazepril (LOTENSIN) 40 MG tablet   amLODipine (NORVASC) 10 MG tablet   Other Relevant Orders   CBC with Differential/Platelet   Comprehensive metabolic panel   Lipid Panel w/o Chol/HDL Ratio    Other Visit Diagnoses    Routine general medical examination at a health care facility    -  Primary   Vaccines up to date. Screening labs checked today. Colonoscopy up to date. Continue diet and exercise. Call with any concerns.        Discussed aspirin prophylaxis for myocardial infarction prevention and decision was made to continue ASA  LABORATORY TESTING:  Health maintenance labs ordered today as discussed above.   The natural history of prostate cancer and ongoing controversy regarding screening and potential treatment outcomes of prostate cancer has been discussed with the patient. The meaning of a false positive PSA and a false negative PSA has been discussed. He indicates understanding of the limitations of this screening test and wishes to proceed with screening PSA testing.   IMMUNIZATIONS:   - Tdap: Tetanus vaccination status reviewed: last tetanus booster within 10 years. - Influenza: Up to date - Pneumovax: Not applicable - Prevnar: Not applicable - COVID: Up to date  SCREENING: - Colonoscopy: Up to date  Discussed with patient purpose of the colonoscopy is to detect colon cancer at curable precancerous or early stages   PATIENT COUNSELING:    Sexuality: Discussed sexually transmitted diseases, partner selection, use of condoms, avoidance of  unintended pregnancy  and contraceptive alternatives.   Advised to avoid cigarette smoking.  I discussed with the patient that most people either abstain from alcohol or drink within safe limits (<=14/week and <=4 drinks/occasion for males, <=7/weeks and <= 3 drinks/occasion for females) and that the risk for alcohol disorders and other health effects rises proportionally with the number of drinks per week and how often a drinker exceeds daily limits.  Discussed cessation/primary  prevention of drug use and availability of treatment for abuse.   Diet: Encouraged to adjust caloric intake to maintain  or achieve ideal body weight, to reduce intake of dietary saturated fat and total fat, to limit sodium intake by avoiding high sodium foods and not adding table salt, and to maintain adequate dietary potassium and calcium preferably from fresh fruits, vegetables, and low-fat dairy products.    stressed the importance of regular exercise  Injury prevention: Discussed safety belts, safety helmets, smoke detector, smoking near bedding or upholstery.   Dental health: Discussed importance of regular tooth brushing, flossing, and dental visits.   Follow up plan: NEXT PREVENTATIVE PHYSICAL DUE IN 1 YEAR. Return in about 6 months (around 10/26/2020).

## 2020-04-26 ENCOUNTER — Encounter: Payer: Self-pay | Admitting: Family Medicine

## 2020-04-26 ENCOUNTER — Other Ambulatory Visit: Payer: Self-pay

## 2020-04-26 ENCOUNTER — Ambulatory Visit (INDEPENDENT_AMBULATORY_CARE_PROVIDER_SITE_OTHER): Payer: Managed Care, Other (non HMO) | Admitting: Family Medicine

## 2020-04-26 VITALS — BP 158/88 | HR 92 | Temp 98.8°F | Ht 69.49 in | Wt 209.0 lb

## 2020-04-26 DIAGNOSIS — E291 Testicular hypofunction: Secondary | ICD-10-CM

## 2020-04-26 DIAGNOSIS — F321 Major depressive disorder, single episode, moderate: Secondary | ICD-10-CM | POA: Diagnosis not present

## 2020-04-26 DIAGNOSIS — E7800 Pure hypercholesterolemia, unspecified: Secondary | ICD-10-CM | POA: Diagnosis not present

## 2020-04-26 DIAGNOSIS — I10 Essential (primary) hypertension: Secondary | ICD-10-CM

## 2020-04-26 DIAGNOSIS — Z0000 Encounter for general adult medical examination without abnormal findings: Secondary | ICD-10-CM | POA: Diagnosis not present

## 2020-04-26 LAB — URINALYSIS, ROUTINE W REFLEX MICROSCOPIC
Bilirubin, UA: NEGATIVE
Leukocytes,UA: NEGATIVE
Nitrite, UA: NEGATIVE
Protein,UA: NEGATIVE
RBC, UA: NEGATIVE
Specific Gravity, UA: 1.02 (ref 1.005–1.030)
Urobilinogen, Ur: 0.2 mg/dL (ref 0.2–1.0)
pH, UA: 5 (ref 5.0–7.5)

## 2020-04-26 LAB — MICROALBUMIN, URINE WAIVED
Creatinine, Urine Waived: 200 mg/dL (ref 10–300)
Microalb, Ur Waived: 30 mg/L — ABNORMAL HIGH (ref 0–19)
Microalb/Creat Ratio: <30 mg/g (ref <30)

## 2020-04-26 MED ORDER — BENAZEPRIL HCL 40 MG PO TABS: 40.0000 mg | ORAL_TABLET | Freq: Every day | ORAL | 1 refills | Status: DC

## 2020-04-26 MED ORDER — AMLODIPINE BESYLATE 10 MG PO TABS: 10.0000 mg | ORAL_TABLET | Freq: Every day | ORAL | 1 refills | Status: DC

## 2020-04-26 MED ORDER — VENLAFAXINE HCL ER 150 MG PO CP24: 150.0000 mg | ORAL_CAPSULE | Freq: Every day | ORAL | 1 refills | Status: DC

## 2020-04-26 MED ORDER — GABAPENTIN 100 MG PO CAPS: 100.0000 mg | ORAL_CAPSULE | Freq: Every day | ORAL | 1 refills | Status: DC

## 2020-04-26 NOTE — Assessment & Plan Note (Signed)
Running a little high. Will monitor at home and call for BPs consistently in the 909M systolic. Will work on [employer]. Continue to monitor. Labs drawn today. Refills given.

## 2020-04-26 NOTE — Assessment & Plan Note (Signed)
Rechecking labs today. Await results. Treat as needed.  °

## 2020-04-26 NOTE — Assessment & Plan Note (Signed)
Under good control on current regimen. Continue current regimen. Continue to monitor. Call with any concerns. Refills given.   

## 2020-04-26 NOTE — Patient Instructions (Signed)
Health Maintenance, Male Adopting a healthy lifestyle and getting preventive care are important in promoting health and wellness. Ask your health care provider about:  The right schedule for you to have regular tests and exams.  Things you can do on your own to prevent diseases and keep yourself healthy. What should I know about diet, weight, and exercise? Eat a healthy diet   Eat a diet that includes plenty of vegetables, fruits, low-fat dairy products, and lean protein.  Do not eat a lot of foods that are high in solid fats, added sugars, or sodium. Maintain a healthy weight Body mass index (BMI) is a measurement that can be used to identify possible weight problems. It estimates body fat based on height and weight. Your health care provider can help determine your BMI and help you achieve or maintain a healthy weight. Get regular exercise Get regular exercise. This is one of the most important things you can do for your health. Most adults should:  Exercise for at least 150 minutes each week. The exercise should increase your heart rate and make you sweat (moderate-intensity exercise).  Do strengthening exercises at least twice a week. This is in addition to the moderate-intensity exercise.  Spend less time sitting. Even light physical activity can be beneficial. Watch cholesterol and blood lipids Have your blood tested for lipids and cholesterol at 56 years of age, then have this test every 5 years. You may need to have your cholesterol levels checked more often if:  Your lipid or cholesterol levels are high.  You are older than 56 years of age.  You are at high risk for heart disease. What should I know about cancer screening? Many types of cancers can be detected early and may often be prevented. Depending on your health history and family history, you may need to have cancer screening at various ages. This may include screening for:  Colorectal cancer.  Prostate  cancer.  Skin cancer.  Lung cancer. What should I know about heart disease, diabetes, and high blood pressure? Blood pressure and heart disease  High blood pressure causes heart disease and increases the risk of stroke. This is more likely to develop in people who have high blood pressure readings, are of African descent, or are overweight.  Talk with your health care provider about your target blood pressure readings.  Have your blood pressure checked: ? Every 3-5 years if you are 18-39 years of age. ? Every year if you are 40 years old or older.  If you are between the ages of 65 and 75 and are a current or former smoker, ask your health care provider if you should have a one-time screening for abdominal aortic aneurysm (AAA). Diabetes Have regular diabetes screenings. This checks your fasting blood sugar level. Have the screening done:  Once every three years after age 45 if you are at a normal weight and have a low risk for diabetes.  More often and at a younger age if you are overweight or have a high risk for diabetes. What should I know about preventing infection? Hepatitis B If you have a higher risk for hepatitis B, you should be screened for this virus. Talk with your health care provider to find out if you are at risk for hepatitis B infection. Hepatitis C Blood testing is recommended for:  Everyone born from 1945 through 1965.  Anyone with known risk factors for hepatitis C. Sexually transmitted infections (STIs)  You should be screened each year   for STIs, including gonorrhea and chlamydia, if: ? You are sexually active and are younger than 56 years of age. ? You are older than 56 years of age and your health care provider tells you that you are at risk for this type of infection. ? Your sexual activity has changed since you were last screened, and you are at increased risk for chlamydia or gonorrhea. Ask your health care provider if you are at risk.  Ask your  health care provider about whether you are at high risk for HIV. Your health care provider may recommend a prescription medicine to help prevent HIV infection. If you choose to take medicine to prevent HIV, you should first get tested for HIV. You should then be tested every 3 months for as long as you are taking the medicine. Follow these instructions at home: Lifestyle  Do not use any products that contain nicotine or tobacco, such as cigarettes, e-cigarettes, and chewing tobacco. If you need help quitting, ask your health care provider.  Do not use street drugs.  Do not share needles.  Ask your health care provider for help if you need support or information about quitting drugs. Alcohol use  Do not drink alcohol if your health care provider tells you not to drink.  If you drink alcohol: ? Limit how much you have to 0-2 drinks a day. ? Be aware of how much alcohol is in your drink. In the [place] one drink equals one 12 oz bottle of beer (355 mL), one 5 oz glass of wine (148 mL), or one 1 oz glass of hard liquor (44 mL). General instructions  Schedule regular health, dental, and eye exams.  Stay current with your vaccines.  Tell your health care provider if: ? You often feel depressed. ? You have ever been abused or do not feel safe at home. Summary  Adopting a healthy lifestyle and getting preventive care are important in promoting health and wellness.  Follow your health care provider's instructions about healthy diet, exercising, and getting tested or screened for diseases.  Follow your health care provider's instructions on monitoring your cholesterol and blood pressure. This information is not intended to replace advice given to you by your health care provider. Make sure you discuss any questions you have with your health care provider. Document Revised: 10/15/2018 Document Reviewed: 10/15/2018 Elsevier Patient Education  2020 Elsevier Inc.  DASH Eating Plan DASH  stands for "Dietary Approaches to Stop Hypertension." The DASH eating plan is a healthy eating plan that has been shown to reduce high blood pressure (hypertension). It may also reduce your risk for type 2 diabetes, heart disease, and stroke. The DASH eating plan may also help with weight loss. What are tips for following this plan?  General guidelines  Avoid eating more than 2,300 mg (milligrams) of salt (sodium) a day. If you have hypertension, you may need to reduce your sodium intake to 1,500 mg a day.  Limit alcohol intake to no more than 1 drink a day for nonpregnant women and 2 drinks a day for men. One drink equals 12 oz of beer, 5 oz of wine, or 1 oz of hard liquor.  Work with your health care provider to maintain a healthy body weight or to lose weight. Ask what an ideal weight is for you.  Get at least 30 minutes of exercise that causes your heart to beat faster (aerobic exercise) most days of the week. Activities may include walking, swimming, or biking.    Work with your health care provider or diet and nutrition specialist (dietitian) to adjust your eating plan to your individual calorie needs. Reading food labels   Check food labels for the amount of sodium per serving. Choose foods with less than 5 percent of the Daily Value of sodium. Generally, foods with less than 300 mg of sodium per serving fit into this eating plan.  To find whole grains, look for the word "whole" as the first word in the ingredient list. Shopping  Buy products labeled as "low-sodium" or "no salt added."  Buy fresh foods. Avoid canned foods and premade or frozen meals. Cooking  Avoid adding salt when cooking. Use salt-free seasonings or herbs instead of table salt or sea salt. Check with your health care provider or pharmacist before using salt substitutes.  Do not fry foods. Cook foods using healthy methods such as baking, boiling, grilling, and broiling instead.  Cook with heart-healthy oils,  such as olive, canola, soybean, or sunflower oil. Meal planning  Eat a balanced diet that includes: ? 5 or more servings of fruits and vegetables each day. At each meal, try to fill half of your plate with fruits and vegetables. ? Up to 6-8 servings of whole grains each day. ? Less than 6 oz of lean meat, poultry, or fish each day. A 3-oz serving of meat is about the same size as a deck of cards. One egg equals 1 oz. ? 2 servings of low-fat dairy each day. ? A serving of nuts, seeds, or beans 5 times each week. ? Heart-healthy fats. Healthy fats called Omega-3 fatty acids are found in foods such as flaxseeds and coldwater fish, like sardines, salmon, and mackerel.  Limit how much you eat of the following: ? Canned or prepackaged foods. ? Food that is high in trans fat, such as fried foods. ? Food that is high in saturated fat, such as fatty meat. ? Sweets, desserts, sugary drinks, and other foods with added sugar. ? Full-fat dairy products.  Do not salt foods before eating.  Try to eat at least 2 vegetarian meals each week.  Eat more home-cooked food and less restaurant, buffet, and fast food.  When eating at a restaurant, ask that your food be prepared with less salt or no salt, if possible. What foods are recommended? The items listed may not be a complete list. Talk with your dietitian about what dietary choices are best for you. Grains Whole-grain or whole-wheat bread. Whole-grain or whole-wheat pasta. Brown rice. Oatmeal. Quinoa. Bulgur. Whole-grain and low-sodium cereals. Pita bread. Low-fat, low-sodium crackers. Whole-wheat flour tortillas. Vegetables Fresh or frozen vegetables (raw, steamed, roasted, or grilled). Low-sodium or reduced-sodium tomato and vegetable juice. Low-sodium or reduced-sodium tomato sauce and tomato paste. Low-sodium or reduced-sodium canned vegetables. Fruits All fresh, dried, or frozen fruit. Canned fruit in natural juice (without added sugar). Meat  and other protein foods Skinless chicken or turkey. Ground chicken or turkey. Pork with fat trimmed off. Fish and seafood. Egg whites. Dried beans, peas, or lentils. Unsalted nuts, nut butters, and seeds. Unsalted canned beans. Lean cuts of beef with fat trimmed off. Low-sodium, lean deli meat. Dairy Low-fat (1%) or fat-free (skim) milk. Fat-free, low-fat, or reduced-fat cheeses. Nonfat, low-sodium ricotta or cottage cheese. Low-fat or nonfat yogurt. Low-fat, low-sodium cheese. Fats and oils Soft margarine without trans fats. Vegetable oil. Low-fat, reduced-fat, or light mayonnaise and salad dressings (reduced-sodium). Canola, safflower, olive, soybean, and sunflower oils. Avocado. Seasoning and other foods Herbs. Spices. Seasoning mixes   without salt. Unsalted popcorn and pretzels. Fat-free sweets. What foods are not recommended? The items listed may not be a complete list. Talk with your dietitian about what dietary choices are best for you. Grains Baked goods made with fat, such as croissants, muffins, or some breads. Dry pasta or rice meal packs. Vegetables Creamed or fried vegetables. Vegetables in a cheese sauce. Regular canned vegetables (not low-sodium or reduced-sodium). Regular canned tomato sauce and paste (not low-sodium or reduced-sodium). Regular tomato and vegetable juice (not low-sodium or reduced-sodium). Pickles. Olives. Fruits Canned fruit in a light or heavy syrup. Fried fruit. Fruit in cream or butter sauce. Meat and other protein foods Fatty cuts of meat. Ribs. Fried meat. Bacon. Sausage. Bologna and other processed lunch meats. Salami. Fatback. Hotdogs. Bratwurst. Salted nuts and seeds. Canned beans with added salt. Canned or smoked fish. Whole eggs or egg yolks. Chicken or turkey with skin. Dairy Whole or 2% milk, cream, and half-and-half. Whole or full-fat cream cheese. Whole-fat or sweetened yogurt. Full-fat cheese. Nondairy creamers. Whipped toppings. Processed cheese and  cheese spreads. Fats and oils Butter. Stick margarine. Lard. Shortening. Ghee. Bacon fat. Tropical oils, such as coconut, palm kernel, or palm oil. Seasoning and other foods Salted popcorn and pretzels. Onion salt, garlic salt, seasoned salt, table salt, and sea salt. Worcestershire sauce. Tartar sauce. Barbecue sauce. Teriyaki sauce. Soy sauce, including reduced-sodium. Steak sauce. Canned and packaged gravies. Fish sauce. Oyster sauce. Cocktail sauce. Horseradish that you find on the shelf. Ketchup. Mustard. Meat flavorings and tenderizers. Bouillon cubes. Hot sauce and Tabasco sauce. Premade or packaged marinades. Premade or packaged taco seasonings. Relishes. Regular salad dressings. Where to find more information:  National Heart, Lung, and Blood Institute: www.nhlbi.nih.gov  American Heart Association: www.heart.org Summary  The DASH eating plan is a healthy eating plan that has been shown to reduce high blood pressure (hypertension). It may also reduce your risk for type 2 diabetes, heart disease, and stroke.  With the DASH eating plan, you should limit salt (sodium) intake to 2,300 mg a day. If you have hypertension, you may need to reduce your sodium intake to 1,500 mg a day.  When on the DASH eating plan, aim to eat more fresh fruits and vegetables, whole grains, lean proteins, low-fat dairy, and heart-healthy fats.  Work with your health care provider or diet and nutrition specialist (dietitian) to adjust your eating plan to your individual calorie needs. This information is not intended to replace advice given to you by your health care provider. Make sure you discuss any questions you have with your health care provider. Document Revised: 10/04/2017 Document Reviewed: 10/15/2016 Elsevier Patient Education  2020 Elsevier Inc.  

## 2020-04-29 ENCOUNTER — Other Ambulatory Visit: Payer: Self-pay | Admitting: Family Medicine

## 2020-04-29 DIAGNOSIS — R972 Elevated prostate specific antigen [PSA]: Secondary | ICD-10-CM

## 2020-04-30 LAB — COMPREHENSIVE METABOLIC PANEL
ALT: 26 IU/L (ref 0–44)
AST: 22 IU/L (ref 0–40)
Albumin/Globulin Ratio: 1.8 (ref 1.2–2.2)
Albumin: 4.6 g/dL (ref 3.8–4.9)
Alkaline Phosphatase: 68 IU/L (ref 48–121)
BUN/Creatinine Ratio: 15 (ref 9–20)
BUN: 13 mg/dL (ref 6–24)
Bilirubin Total: 0.6 mg/dL (ref 0.0–1.2)
CO2: 24 mmol/L (ref 20–29)
Calcium: 9.7 mg/dL (ref 8.7–10.2)
Chloride: 104 mmol/L (ref 96–106)
Creatinine, Ser: 0.89 mg/dL (ref 0.76–1.27)
GFR calc Af Amer: 111 mL/min/{1.73_m2} (ref >59)
GFR calc non Af Amer: 96 mL/min/{1.73_m2} (ref >59)
Globulin, Total: 2.6 g/dL (ref 1.5–4.5)
Glucose: 227 mg/dL — ABNORMAL HIGH (ref 65–99)
Potassium: 4.3 mmol/L (ref 3.5–5.2)
Sodium: 141 mmol/L (ref 134–144)
Total Protein: 7.2 g/dL (ref 6.0–8.5)

## 2020-04-30 LAB — CBC WITH DIFFERENTIAL/PLATELET
Basophils Absolute: 0.1 10*3/uL (ref 0.0–0.2)
Basos: 1 %
EOS (ABSOLUTE): 0.3 10*3/uL (ref 0.0–0.4)
Eos: 6 %
Hematocrit: 43.5 % (ref 37.5–51.0)
Hemoglobin: 15.4 g/dL (ref 13.0–17.7)
Immature Grans (Abs): 0 10*3/uL (ref 0.0–0.1)
Immature Granulocytes: 0 %
Lymphocytes Absolute: 1.2 10*3/uL (ref 0.7–3.1)
Lymphs: 29 %
MCH: 32.2 pg (ref 26.6–33.0)
MCHC: 35.4 g/dL (ref 31.5–35.7)
MCV: 91 fL (ref 79–97)
Monocytes Absolute: 0.4 10*3/uL (ref 0.1–0.9)
Monocytes: 10 %
Neutrophils Absolute: 2.3 10*3/uL (ref 1.4–7.0)
Neutrophils: 54 %
Platelets: 201 10*3/uL (ref 150–450)
RBC: 4.78 x10E6/uL (ref 4.14–5.80)
RDW: 12.5 % (ref 11.6–15.4)
WBC: 4.2 10*3/uL (ref 3.4–10.8)

## 2020-04-30 LAB — TSH: TSH: 1.53 u[IU]/mL (ref 0.450–4.500)

## 2020-04-30 LAB — LIPID PANEL W/O CHOL/HDL RATIO
Cholesterol, Total: 252 mg/dL — ABNORMAL HIGH (ref 100–199)
HDL: 70 mg/dL (ref >39)
LDL Chol Calc (NIH): 133 mg/dL — ABNORMAL HIGH (ref 0–99)
Triglycerides: 279 mg/dL — ABNORMAL HIGH (ref 0–149)
VLDL Cholesterol Cal: 49 mg/dL — ABNORMAL HIGH (ref 5–40)

## 2020-04-30 LAB — TESTOSTERONE, FREE, TOTAL, SHBG
Sex Hormone Binding: 23.6 nmol/L (ref 19.3–76.4)
Testosterone, Free: 12 pg/mL (ref 7.2–24.0)
Testosterone: 237 ng/dL — ABNORMAL LOW (ref 264–916)

## 2020-04-30 LAB — PSA: Prostate Specific Ag, Serum: 5 ng/mL — ABNORMAL HIGH (ref 0.0–4.0)

## 2020-05-02 ENCOUNTER — Other Ambulatory Visit: Payer: Self-pay | Admitting: Family Medicine

## 2020-05-02 DIAGNOSIS — R972 Elevated prostate specific antigen [PSA]: Secondary | ICD-10-CM

## 2020-05-18 ENCOUNTER — Other Ambulatory Visit: Payer: Self-pay | Admitting: Family Medicine

## 2020-05-27 ENCOUNTER — Other Ambulatory Visit: Payer: Self-pay

## 2020-05-27 ENCOUNTER — Other Ambulatory Visit: Payer: Managed Care, Other (non HMO)

## 2020-05-27 DIAGNOSIS — R972 Elevated prostate specific antigen [PSA]: Secondary | ICD-10-CM

## 2020-05-28 LAB — PSA: Prostate Specific Ag, Serum: 4.7 ng/mL — ABNORMAL HIGH (ref 0.0–4.0)

## 2020-06-01 ENCOUNTER — Encounter: Payer: Self-pay | Admitting: Family Medicine

## 2020-08-08 ENCOUNTER — Other Ambulatory Visit: Payer: Self-pay | Admitting: Family Medicine

## 2020-08-08 NOTE — Telephone Encounter (Signed)
Requested Prescriptions  Pending Prescriptions Disp Refills   amLODipine (NORVASC) 10 MG tablet [Pharmacy Med Name: amLODIPine BESYLATE 10 MG TAB] 90 tablet 0    Sig: TAKE ONE TABLET BY MOUTH DAILY     Cardiovascular:  Calcium Channel Blockers Failed - 08/08/2020  9:38 AM      Failed - Last BP in normal range    BP Readings from Last 1 Encounters:  04/26/20 (!) 158/88         Passed - Valid encounter within last 6 months    Recent Outpatient Visits          3 months ago Routine general medical examination at a health care facility   [facility], [place] P, DO   7 months ago Skin lesion of hand   [place], [place], DO   9 months ago Essential hypertension   [place], [place], DO   11 months ago Cervical radiculopathy   [place], DO   1 year ago Neck pain on right side   [place], Lilia Argue, [place]      Future Appointments            In 2 months Wynetta Emery, Barb Merino, DO [employer], [place]

## 2020-08-11 IMAGING — DX DG CHEST 2V
2 series · 3 of 3 positions shown · non-contrast
Comparison: 11/28/2012

CLINICAL DATA: Cough x2 days

EXAM:
CHEST - 2 VIEW

[Series 1: chest pa · 0.14mm/px · 2 of 2 slices shown]
[im 1/2]
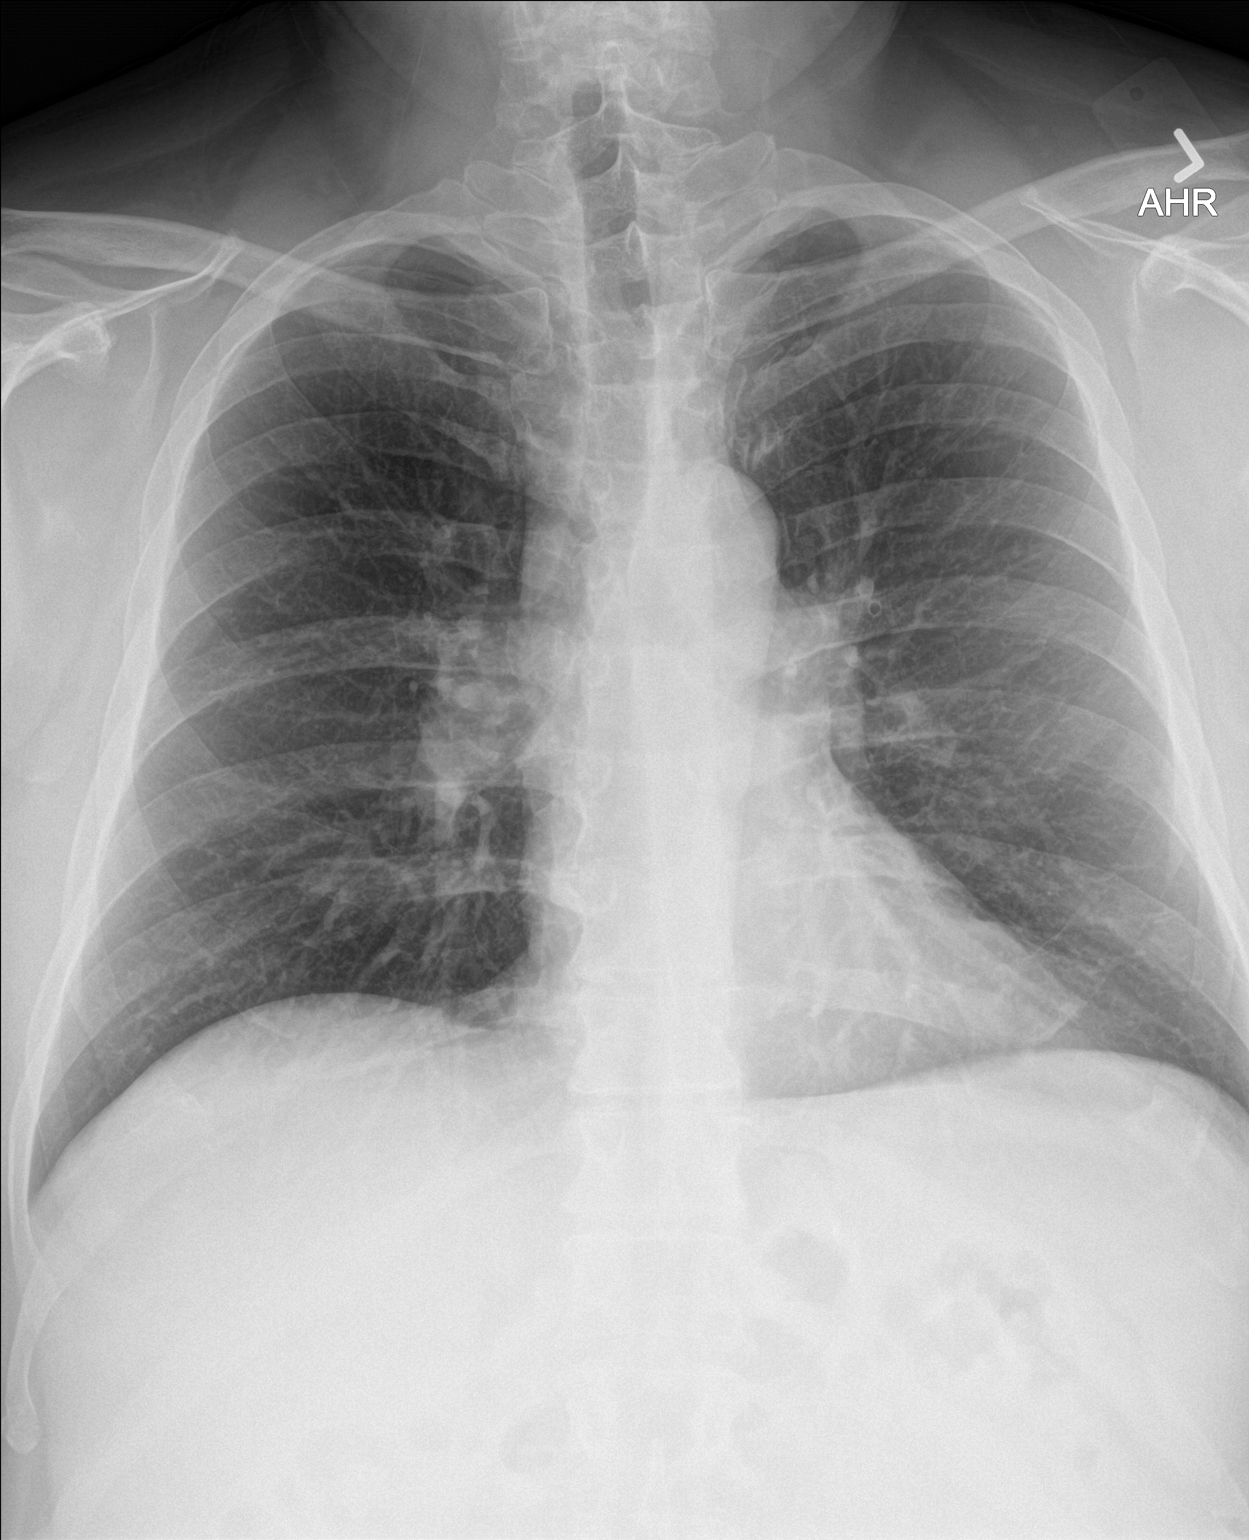
[im 2/2]
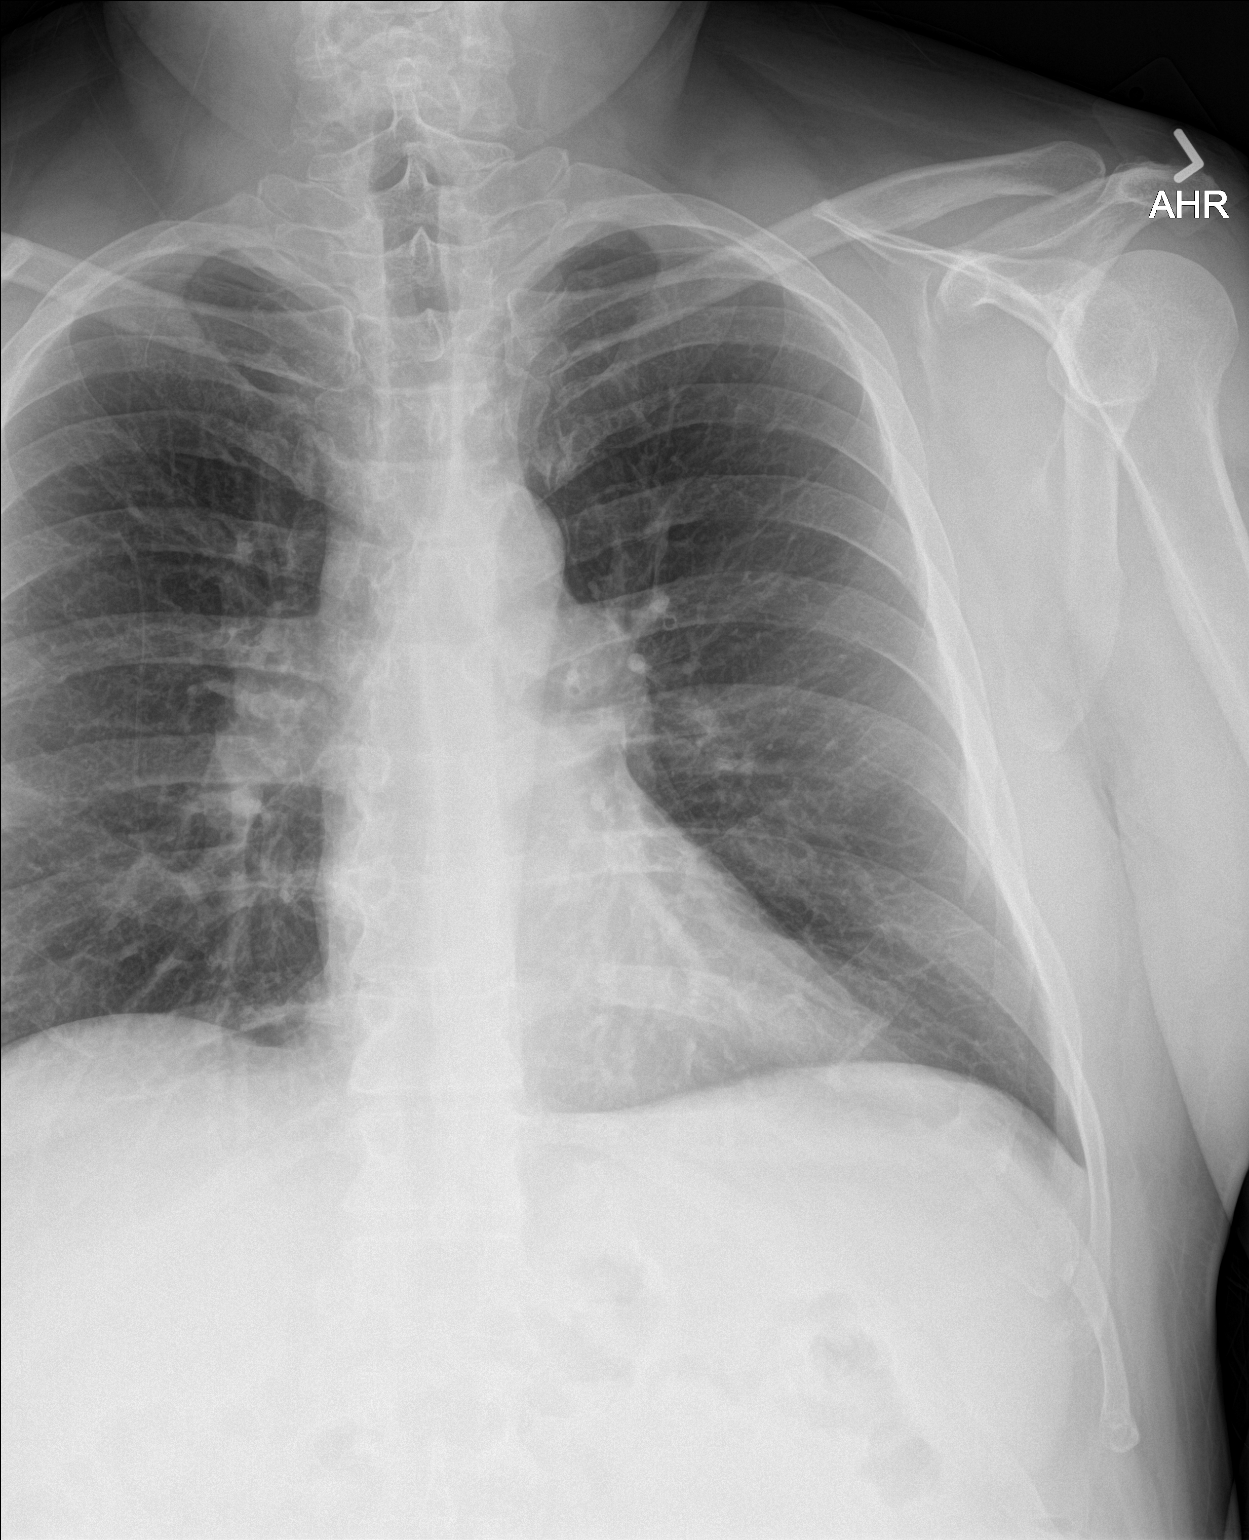

[chest lat]
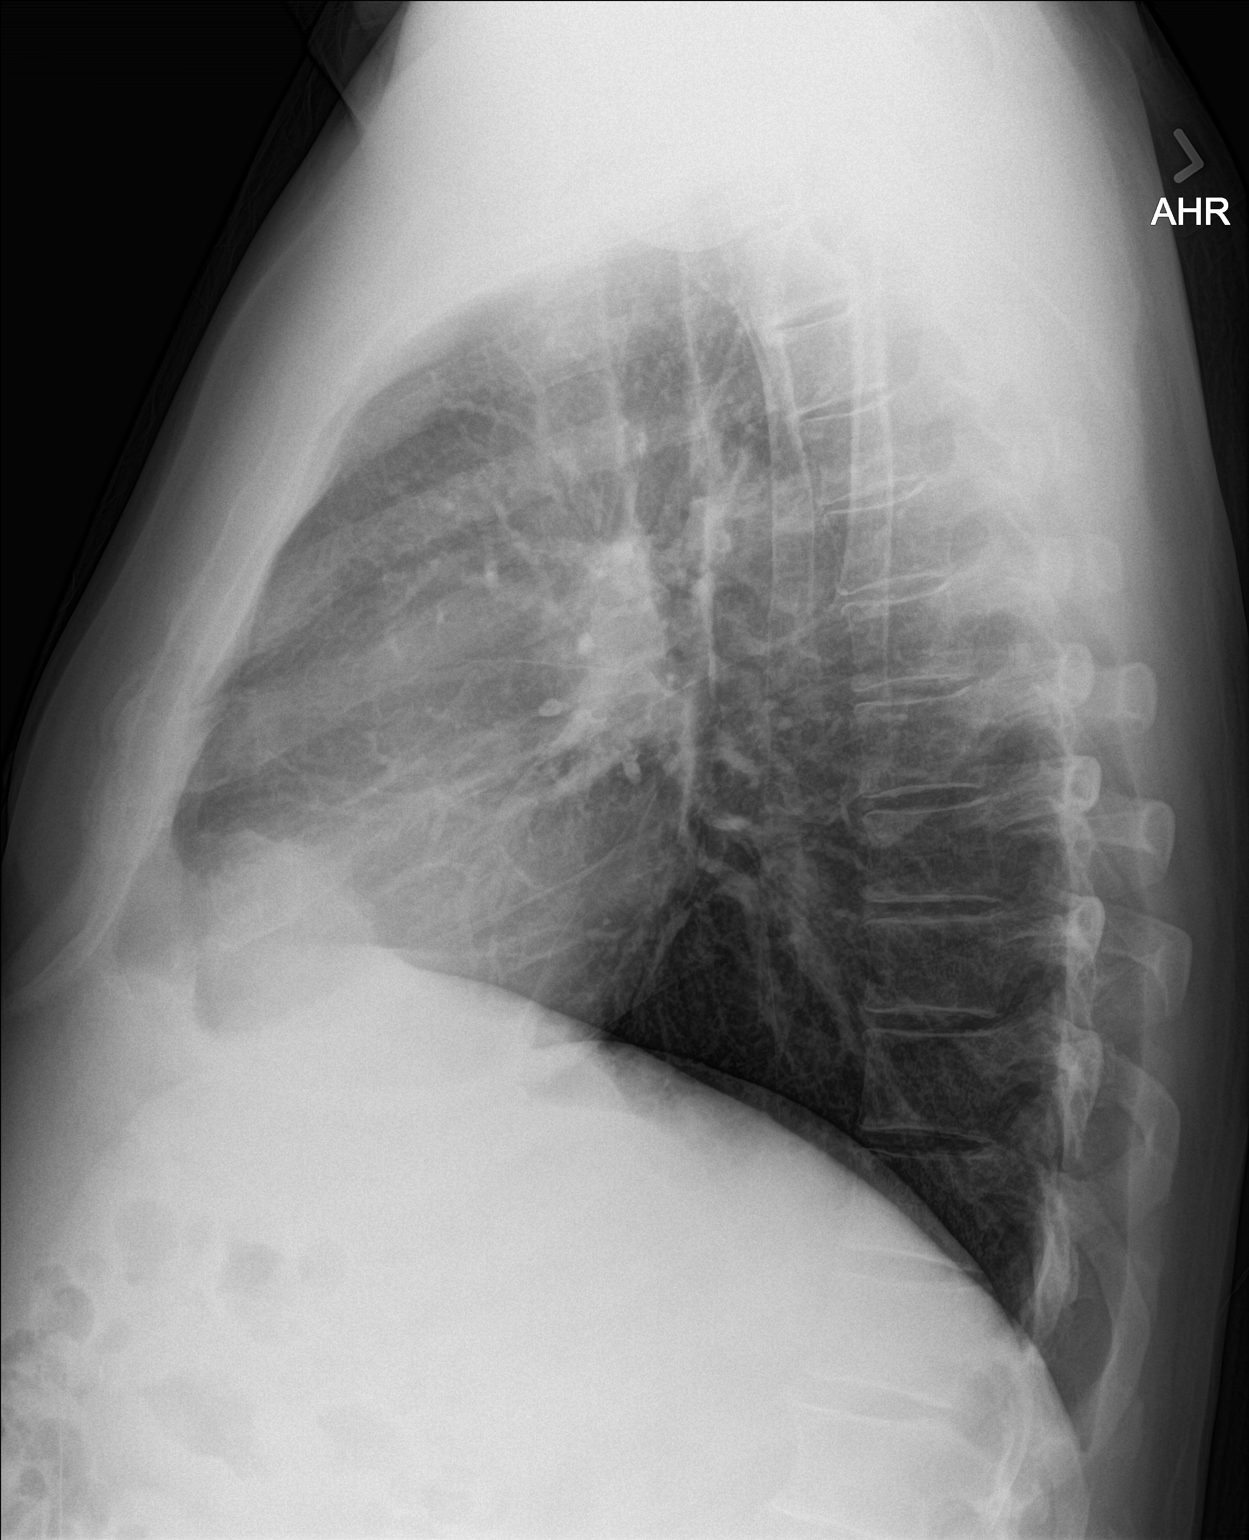

[3 of 3 positions shown; findings below may reference images not displayed]

FINDINGS: Lungs are clear.  No pleural effusion or pneumothorax.

The heart is normal in size.

Visualized osseous structures are within normal limits.
IMPRESSION: Normal chest radiographs.

## 2020-10-26 ENCOUNTER — Other Ambulatory Visit: Payer: Self-pay

## 2020-10-26 ENCOUNTER — Ambulatory Visit (INDEPENDENT_AMBULATORY_CARE_PROVIDER_SITE_OTHER): Payer: Managed Care, Other (non HMO) | Admitting: Family Medicine

## 2020-10-26 ENCOUNTER — Encounter: Payer: Self-pay | Admitting: Family Medicine

## 2020-10-26 VITALS — BP 150/90 | HR 94 | Temp 98.5°F | Wt 211.4 lb

## 2020-10-26 DIAGNOSIS — E291 Testicular hypofunction: Secondary | ICD-10-CM

## 2020-10-26 DIAGNOSIS — N401 Enlarged prostate with lower urinary tract symptoms: Secondary | ICD-10-CM

## 2020-10-26 DIAGNOSIS — E7800 Pure hypercholesterolemia, unspecified: Secondary | ICD-10-CM

## 2020-10-26 DIAGNOSIS — R972 Elevated prostate specific antigen [PSA]: Secondary | ICD-10-CM

## 2020-10-26 DIAGNOSIS — I10 Essential (primary) hypertension: Secondary | ICD-10-CM

## 2020-10-26 DIAGNOSIS — F321 Major depressive disorder, single episode, moderate: Secondary | ICD-10-CM | POA: Diagnosis not present

## 2020-10-26 DIAGNOSIS — R3911 Hesitancy of micturition: Secondary | ICD-10-CM

## 2020-10-26 MED ORDER — AMLODIPINE BESYLATE 10 MG PO TABS: 10.0000 mg | ORAL_TABLET | Freq: Every day | ORAL | 1 refills | Status: DC

## 2020-10-26 MED ORDER — HYDROCHLOROTHIAZIDE 25 MG PO TABS: 25.0000 mg | ORAL_TABLET | Freq: Every day | ORAL | 1 refills | Status: DC

## 2020-10-26 MED ORDER — BENAZEPRIL HCL 40 MG PO TABS: 40.0000 mg | ORAL_TABLET | Freq: Every day | ORAL | 1 refills | Status: DC

## 2020-10-26 MED ORDER — FINASTERIDE 5 MG PO TABS: 5.0000 mg | ORAL_TABLET | Freq: Every day | ORAL | 1 refills | Status: DC

## 2020-10-26 MED ORDER — VENLAFAXINE HCL ER 150 MG PO CP24: 150.0000 mg | ORAL_CAPSULE | Freq: Every day | ORAL | 1 refills | Status: DC

## 2020-10-26 NOTE — Progress Notes (Signed)
BP (!) 150/90   Pulse 94   Temp 98.5 F (36.9 C)   Wt 211 lb 6.4 oz (95.9 kg)   SpO2 98%   BMI 30.78 kg/m    Subjective:    Patient ID: Jesse Dunlap, male    DOB: Jul 05, 1966, 54 y.o.   MRN: [id number]  HPI: Dago Jungwirth is a 54 y.o. male  Chief Complaint  Patient presents with  . Hypertension  . Depression   HYPERTENSION / HYPERLIPIDEMIA Satisfied with current treatment? no Duration of hypertension: chronic BP monitoring frequency: rarely BP medication side effects: no Past BP meds: amlodipine, benazepril Duration of hyperlipidemia: chronic Cholesterol medication side effects: not on anything Cholesterol supplements: none Past cholesterol medications: none Medication compliance: excellent compliance Aspirin: no Recent stressors: no Recurrent headaches: no Visual changes: no Palpitations: no Dyspnea: no Chest pain: no Lower extremity edema: no Dizzy/lightheaded: no  DEPRESSION Mood status: controlled Satisfied with current treatment?: no Symptom severity: mild  Duration of current treatment : chronic Side effects: no Medication compliance: excellent compliance Psychotherapy/counseling: no  Previous psychiatric medications: effexor Depressed mood: no Anxious mood: no Anhedonia: no Significant weight loss or gain: no Insomnia: no  Fatigue: no Feelings of worthlessness or guilt: no Impaired concentration/indecisiveness: no Suicidal ideations: no Hopelessness: no Crying spells: no Depression screen [facility] 2/9 10/26/2020 04/26/2020 10/14/2019 08/25/2019 04/22/2019  Decreased Interest 0 0 0 1 1  Down, Depressed, Hopeless 0 0 0 1 1  PHQ - 2 Score 0 0 0 2 2  Altered sleeping 0 1 0 2 1  Tired, decreased energy 0 0 0 1 1  Change in appetite 0 0 0 1 1  Feeling bad or failure about yourself  1 1 1 2 2   Trouble concentrating 1 0 0 2 1  Moving slowly or fidgety/restless 0 0 0 0 0  Suicidal thoughts 0 0 0 0 0  PHQ-9 Score 2 2 1 10 8   Difficult doing  work/chores Not difficult at all Not difficult at all Not difficult at all Somewhat difficult -  Some recent data might be hidden   BPH BPH status: controlled Satisfied with current treatment?: yes Medication side effects: no Medication compliance: excellent compliance Duration: months Nocturia: 1/night Urinary frequency:no Incomplete voiding: no Urgency: no Weak urinary stream: yes Straining to start stream: yes Dysuria: no Onset: gradual Severity: mild  Relevant past medical, surgical, family and social history reviewed and updated as indicated. Interim medical history since our last visit reviewed. Allergies and medications reviewed and updated.  Review of Systems  Constitutional: Negative.   Respiratory: Negative.   Cardiovascular: Negative.   Gastrointestinal: Negative.   Genitourinary: Positive for decreased urine volume. Negative for difficulty urinating, dysuria, enuresis, flank pain, frequency, genital sores, hematuria, penile discharge, penile pain, penile swelling, scrotal swelling, testicular pain and urgency.  Musculoskeletal: Negative.   Neurological: Negative.   Psychiatric/Behavioral: Negative.     Per HPI unless specifically indicated above     Objective:    BP (!) 150/90   Pulse 94   Temp 98.5 F (36.9 C)   Wt 211 lb 6.4 oz (95.9 kg)   SpO2 98%   BMI 30.78 kg/m   Wt Readings from Last 3 Encounters:  10/26/20 211 lb 6.4 oz (95.9 kg)  04/26/20 209 lb (94.8 kg)  02/29/20 203 lb (92.1 kg)    Physical Exam Vitals and nursing note reviewed.  Constitutional:      General: He is not in acute distress.    Appearance: Normal  appearance. He is not ill-appearing, toxic-appearing or diaphoretic.  HENT:     Head: Normocephalic and atraumatic.     Right Ear: External ear normal.     Left Ear: External ear normal.     Nose: Nose normal.     Mouth/Throat:     Mouth: Mucous membranes are moist.     Pharynx: Oropharynx is clear.  Eyes:     General: No  scleral icterus.       Right eye: No discharge.        Left eye: No discharge.     Extraocular Movements: Extraocular movements intact.     Conjunctiva/sclera: Conjunctivae normal.     Pupils: Pupils are equal, round, and reactive to light.  Cardiovascular:     Rate and Rhythm: Normal rate and regular rhythm.     Pulses: Normal pulses.     Heart sounds: Normal heart sounds. No murmur heard. No friction rub. No gallop.   Pulmonary:     Effort: Pulmonary effort is normal. No respiratory distress.     Breath sounds: Normal breath sounds. No stridor. No wheezing, rhonchi or rales.  Chest:     Chest wall: No tenderness.  Musculoskeletal:        General: Normal range of motion.     Cervical back: Normal range of motion and neck supple.  Skin:    General: Skin is warm and dry.     Capillary Refill: Capillary refill takes less than 2 seconds.     Coloration: Skin is not jaundiced or pale.     Findings: No bruising, erythema, lesion or rash.  Neurological:     General: No focal deficit present.     Mental Status: He is alert and oriented to person, place, and time. Mental status is at baseline.  Psychiatric:        Mood and Affect: Mood normal.        Behavior: Behavior normal.        Thought Content: Thought content normal.        Judgment: Judgment normal.     Results for orders placed or performed in visit on 05/27/20  PSA  Result Value Ref Range   Prostate Specific Ag, Serum 4.7 (H) 0.0 - 4.0 ng/mL      Assessment & Plan:   Problem List Items Addressed This Visit      Cardiovascular and Mediastinum   Hypertension - Primary    Running high. Continue amlodipine and benazepril and add HCTZ. Recheck 1-2 months. Call with any concerns.       Relevant Medications   benazepril (LOTENSIN) 40 MG tablet   amLODipine (NORVASC) 10 MG tablet   hydrochlorothiazide (HYDRODIURIL) 25 MG tablet   Other Relevant Orders   Comprehensive metabolic panel     Endocrine   Hypogonadism in  male    Rechecking labs today. Await results. Treat as needed.       Relevant Orders   Comprehensive metabolic panel   Testosterone, free, total([facility])     Genitourinary   Benign prostatic hyperplasia with urinary hesitancy    Will start finasteride. Rx given today. Call with any concerns. Recheck 1-2 months.       Relevant Medications   finasteride (PROSCAR) 5 MG tablet     Other   Depression, major, single episode, moderate (HCC)    Under good control on current regimen. Continue current regimen. Continue to monitor. Call with any concerns. Refills given.  Relevant Medications   venlafaxine XR (EFFEXOR-XR) 150 MG 24 hr capsule   Hypercholesteremia    Rechecking labs today. Await results. Treat as needed. Call with any concerns.       Relevant Medications   benazepril (LOTENSIN) 40 MG tablet   amLODipine (NORVASC) 10 MG tablet   hydrochlorothiazide (HYDRODIURIL) 25 MG tablet   Other Relevant Orders   Comprehensive metabolic panel   Lipid Panel w/o Chol/HDL Ratio    Other Visit Diagnoses    Elevated PSA       Rechecking labs today. Await results. Treat as needed.    Relevant Orders   PSA   Essential hypertension       Relevant Medications   benazepril (LOTENSIN) 40 MG tablet   amLODipine (NORVASC) 10 MG tablet   hydrochlorothiazide (HYDRODIURIL) 25 MG tablet       Follow up plan: Return 1-2 months.

## 2020-10-26 NOTE — Assessment & Plan Note (Signed)
Running high. Continue amlodipine and benazepril and add HCTZ. Recheck 1-2 months. Call with any concerns.

## 2020-10-26 NOTE — Assessment & Plan Note (Signed)
Rechecking labs today. Await results. Treat as needed. Call with any concerns.  

## 2020-10-26 NOTE — Assessment & Plan Note (Signed)
Will start finasteride. Rx given today. Call with any concerns. Recheck 1-2 months.

## 2020-10-26 NOTE — Assessment & Plan Note (Signed)
Rechecking labs today. Await results. Treat as needed.  °

## 2020-10-26 NOTE — Assessment & Plan Note (Signed)
Under good control on current regimen. Continue current regimen. Continue to monitor. Call with any concerns. Refills given.   

## 2020-10-27 LAB — COMPREHENSIVE METABOLIC PANEL
ALT: 32 IU/L (ref 0–44)
AST: 22 IU/L (ref 0–40)
Albumin/Globulin Ratio: 1.9 (ref 1.2–2.2)
Albumin: 4.5 g/dL (ref 3.8–4.9)
Alkaline Phosphatase: 70 IU/L (ref 44–121)
BUN/Creatinine Ratio: 15 (ref 9–20)
BUN: 14 mg/dL (ref 6–24)
Bilirubin Total: 0.6 mg/dL (ref 0.0–1.2)
CO2: 26 mmol/L (ref 20–29)
Calcium: 10 mg/dL (ref 8.7–10.2)
Chloride: 102 mmol/L (ref 96–106)
Creatinine, Ser: 0.93 mg/dL (ref 0.76–1.27)
GFR calc Af Amer: 106 mL/min/{1.73_m2} (ref >59)
GFR calc non Af Amer: 92 mL/min/{1.73_m2} (ref >59)
Globulin, Total: 2.4 g/dL (ref 1.5–4.5)
Glucose: 166 mg/dL — ABNORMAL HIGH (ref 65–99)
Potassium: 4.6 mmol/L (ref 3.5–5.2)
Sodium: 143 mmol/L (ref 134–144)
Total Protein: 6.9 g/dL (ref 6.0–8.5)

## 2020-10-27 LAB — PSA: Prostate Specific Ag, Serum: 3.9 ng/mL (ref 0.0–4.0)

## 2020-10-27 LAB — LIPID PANEL W/O CHOL/HDL RATIO
Cholesterol, Total: 221 mg/dL — ABNORMAL HIGH (ref 100–199)
HDL: 68 mg/dL (ref >39)
LDL Chol Calc (NIH): 125 mg/dL — ABNORMAL HIGH (ref 0–99)
Triglycerides: 162 mg/dL — ABNORMAL HIGH (ref 0–149)
VLDL Cholesterol Cal: 28 mg/dL (ref 5–40)

## 2020-10-27 LAB — TESTOSTERONE, FREE, TOTAL, SHBG
Sex Hormone Binding: 20.8 nmol/L (ref 19.3–76.4)
Testosterone, Free: 11.6 pg/mL (ref 7.2–24.0)
Testosterone: 207 ng/dL — ABNORMAL LOW (ref 264–916)

## 2020-11-12 ENCOUNTER — Other Ambulatory Visit: Payer: Self-pay | Admitting: Family Medicine

## 2020-11-12 DIAGNOSIS — I10 Essential (primary) hypertension: Secondary | ICD-10-CM

## 2020-12-27 ENCOUNTER — Ambulatory Visit: Payer: Managed Care, Other (non HMO) | Admitting: Family Medicine

## 2020-12-27 ENCOUNTER — Encounter: Payer: Self-pay | Admitting: Family Medicine

## 2020-12-27 ENCOUNTER — Other Ambulatory Visit: Payer: Self-pay

## 2020-12-27 VITALS — BP 128/86 | HR 101 | Temp 99.0°F | Wt 212.6 lb

## 2020-12-27 DIAGNOSIS — N401 Enlarged prostate with lower urinary tract symptoms: Secondary | ICD-10-CM | POA: Diagnosis not present

## 2020-12-27 DIAGNOSIS — R3911 Hesitancy of micturition: Secondary | ICD-10-CM

## 2020-12-27 DIAGNOSIS — R5382 Chronic fatigue, unspecified: Secondary | ICD-10-CM

## 2020-12-27 DIAGNOSIS — I10 Essential (primary) hypertension: Secondary | ICD-10-CM | POA: Diagnosis not present

## 2020-12-27 NOTE — Assessment & Plan Note (Signed)
No better with the finasteride. Will stop. Call with any concerns. Continue to monitor.

## 2020-12-27 NOTE — Progress Notes (Signed)
BP 128/86   Pulse (!) 101   Temp 99 F (37.2 C)   Wt 212 lb 9.6 oz (96.4 kg)   SpO2 97%   BMI 30.96 kg/m    Subjective:    Patient ID: Jesse Dunlap, male    DOB: 08/08/1966, 55 y.o.   MRN: [id number]  HPI: Jesse Dunlap is a 55 y.o. male  Chief Complaint  Patient presents with  . Hypertension  . Elevated PSA  . Depression  . Fatigue    Patient states lately he has been very tired, hard to get out of bed    HYPERTENSION Hypertension status: controlled  Satisfied with current treatment? yes Duration of hypertension: chronic BP monitoring frequency:  not checking BP medication side effects:  no Medication compliance: excellent compliance Aspirin: no Recurrent headaches: no Visual changes: no Palpitations: no Dyspnea: no Chest pain: no Lower extremity edema: no Dizzy/lightheaded: no  BPH BPH status: no changes, not noticing any difference Satisfied with current treatment?: yes Medication side effects: no Medication compliance: excellent compliance Duration: chronic Nocturia: 1/night Urinary frequency:no Incomplete voiding: no Urgency: no Weak urinary stream: yes Straining to start stream: no Dysuria: no Onset: gradual Severity: mild  FATIGUE Duration:  Couple of weeks Severity: mild- more feeling lazy than sleepy  Onset: sudden Context when symptoms started:  unknown Symptoms improve with rest: no  Depressive symptoms: yes Stress/anxiety: no Insomnia: no  Snoring: yes Observed apnea by bed partner: no Daytime hypersomnolence:no Wakes feeling refreshed: yes History of sleep study: no Dysnea on exertion:  no Orthopnea/PND: no Chest pain: no Chronic cough: no Lower extremity edema: no Arthralgias:no Myalgias: no Weakness: no Rash: no   Relevant past medical, surgical, family and social history reviewed and updated as indicated. Interim medical history since our last visit reviewed. Allergies and medications reviewed and  updated.  Review of Systems  Constitutional: Positive for fatigue. Negative for activity change, appetite change, chills, diaphoresis, fever and unexpected weight change.  Respiratory: Negative.   Cardiovascular: Negative.   Gastrointestinal: Negative.   Musculoskeletal: Negative.   Psychiatric/Behavioral: Negative.     Per HPI unless specifically indicated above     Objective:    BP 128/86   Pulse (!) 101   Temp 99 F (37.2 C)   Wt 212 lb 9.6 oz (96.4 kg)   SpO2 97%   BMI 30.96 kg/m   Wt Readings from Last 3 Encounters:  12/27/20 212 lb 9.6 oz (96.4 kg)  10/26/20 211 lb 6.4 oz (95.9 kg)  04/26/20 209 lb (94.8 kg)    Physical Exam Vitals and nursing note reviewed.  Constitutional:      General: He is not in acute distress.    Appearance: Normal appearance. He is not ill-appearing, toxic-appearing or diaphoretic.  HENT:     Head: Normocephalic and atraumatic.     Right Ear: External ear normal.     Left Ear: External ear normal.     Nose: Nose normal.     Mouth/Throat:     Mouth: Mucous membranes are moist.     Pharynx: Oropharynx is clear.  Eyes:     General: No scleral icterus.       Right eye: No discharge.        Left eye: No discharge.     Extraocular Movements: Extraocular movements intact.     Conjunctiva/sclera: Conjunctivae normal.     Pupils: Pupils are equal, round, and reactive to light.  Cardiovascular:     Rate and Rhythm: Normal  rate and regular rhythm.     Pulses: Normal pulses.     Heart sounds: Normal heart sounds. No murmur heard. No friction rub. No gallop.   Pulmonary:     Effort: Pulmonary effort is normal. No respiratory distress.     Breath sounds: Normal breath sounds. No stridor. No wheezing, rhonchi or rales.  Chest:     Chest wall: No tenderness.  Musculoskeletal:        General: Normal range of motion.     Cervical back: Normal range of motion and neck supple.  Skin:    General: Skin is warm and dry.     Capillary Refill:  Capillary refill takes less than 2 seconds.     Coloration: Skin is not jaundiced or pale.     Findings: No bruising, erythema, lesion or rash.  Neurological:     General: No focal deficit present.     Mental Status: He is alert and oriented to person, place, and time. Mental status is at baseline.  Psychiatric:        Mood and Affect: Mood normal.        Behavior: Behavior normal.        Thought Content: Thought content normal.        Judgment: Judgment normal.     Results for orders placed or performed in visit on 10/26/20  Comprehensive metabolic panel  Result Value Ref Range   Glucose 166 (H) 65 - 99 mg/dL   BUN 14 6 - 24 mg/dL   Creatinine, Ser 0.93 0.76 - 1.27 mg/dL   GFR calc non Af Amer 92 >59 mL/min/1.73   GFR calc Af Amer 106 >59 mL/min/1.73   BUN/Creatinine Ratio 15 9 - 20   Sodium 143 134 - 144 mmol/L   Potassium 4.6 3.5 - 5.2 mmol/L   Chloride 102 96 - 106 mmol/L   CO2 26 20 - 29 mmol/L   Calcium 10.0 8.7 - 10.2 mg/dL   Total Protein 6.9 6.0 - 8.5 g/dL   Albumin 4.5 3.8 - 4.9 g/dL   Globulin, Total 2.4 1.5 - 4.5 g/dL   Albumin/Globulin Ratio 1.9 1.2 - 2.2   Bilirubin Total 0.6 0.0 - 1.2 mg/dL   Alkaline Phosphatase 70 44 - 121 IU/L   AST 22 0 - 40 IU/L   ALT 32 0 - 44 IU/L  Lipid Panel w/o Chol/HDL Ratio  Result Value Ref Range   Cholesterol, Total 221 (H) 100 - 199 mg/dL   Triglycerides 162 (H) 0 - 149 mg/dL   HDL 68 >39 mg/dL   VLDL Cholesterol Cal 28 5 - 40 mg/dL   LDL Chol Calc (NIH) 125 (H) 0 - 99 mg/dL  Testosterone, free, total([facility])  Result Value Ref Range   Testosterone 207 (L) 264 - 916 ng/dL   Testosterone, Free 11.6 7.2 - 24.0 pg/mL   Sex Hormone Binding 20.8 19.3 - 76.4 nmol/L  PSA  Result Value Ref Range   Prostate Specific Ag, Serum 3.9 0.0 - 4.0 ng/mL      Assessment & Plan:   Problem List Items Addressed This Visit      Cardiovascular and Mediastinum   Hypertension - Primary    Under good control on current regimen.  Continue current regimen. Continue to monitor. Call with any concerns. Refills up to date. Labs drawn today.        Relevant Orders   Basic metabolic panel     Genitourinary   Benign prostatic hyperplasia with urinary  hesitancy    No better with the finasteride. Will stop. Call with any concerns. Continue to monitor.        Other Visit Diagnoses    Chronic fatigue       Will check labs- T low last time. Treat as needed. If everything normal, will increase his effexor. Call with any concerns.    Relevant Orders   Testosterone, free, total([facility])   TSH   CBC with Differential/Platelet       Follow up plan: Return June, physical.

## 2020-12-27 NOTE — Assessment & Plan Note (Signed)
Under good control on current regimen. Continue current regimen. Continue to monitor. Call with any concerns. Refills up to date. Labs drawn today.  

## 2020-12-29 LAB — CBC WITH DIFFERENTIAL/PLATELET
Basophils Absolute: 0 10*3/uL (ref 0.0–0.2)
Basos: 1 %
EOS (ABSOLUTE): 0.2 10*3/uL (ref 0.0–0.4)
Eos: 3 %
Hematocrit: 44.6 % (ref 37.5–51.0)
Hemoglobin: 15.6 g/dL (ref 13.0–17.7)
Immature Grans (Abs): 0 10*3/uL (ref 0.0–0.1)
Immature Granulocytes: 0 %
Lymphocytes Absolute: 1.4 10*3/uL (ref 0.7–3.1)
Lymphs: 27 %
MCH: 32.6 pg (ref 26.6–33.0)
MCHC: 35 g/dL (ref 31.5–35.7)
MCV: 93 fL (ref 79–97)
Monocytes Absolute: 0.6 10*3/uL (ref 0.1–0.9)
Monocytes: 11 %
Neutrophils Absolute: 2.9 10*3/uL (ref 1.4–7.0)
Neutrophils: 58 %
Platelets: 193 10*3/uL (ref 150–450)
RBC: 4.79 x10E6/uL (ref 4.14–5.80)
RDW: 12.3 % (ref 11.6–15.4)
WBC: 5 10*3/uL (ref 3.4–10.8)

## 2020-12-29 LAB — BASIC METABOLIC PANEL
BUN/Creatinine Ratio: 20 (ref 9–20)
BUN: 19 mg/dL (ref 6–24)
CO2: 21 mmol/L (ref 20–29)
Calcium: 9.8 mg/dL (ref 8.7–10.2)
Chloride: 102 mmol/L (ref 96–106)
Creatinine, Ser: 0.94 mg/dL (ref 0.76–1.27)
GFR calc Af Amer: 104 mL/min/{1.73_m2} (ref >59)
GFR calc non Af Amer: 90 mL/min/{1.73_m2} (ref >59)
Glucose: 171 mg/dL — ABNORMAL HIGH (ref 65–99)
Potassium: 4.7 mmol/L (ref 3.5–5.2)
Sodium: 141 mmol/L (ref 134–144)

## 2020-12-29 LAB — TESTOSTERONE, FREE, TOTAL, SHBG
Sex Hormone Binding: 18.5 nmol/L — ABNORMAL LOW (ref 19.3–76.4)
Testosterone, Free: 9.4 pg/mL (ref 7.2–24.0)
Testosterone: 136 ng/dL — ABNORMAL LOW (ref 264–916)

## 2020-12-29 LAB — TSH: TSH: 1.08 u[IU]/mL (ref 0.450–4.500)

## 2020-12-30 ENCOUNTER — Other Ambulatory Visit: Payer: Self-pay | Admitting: Family Medicine

## 2020-12-30 MED ORDER — TESTOSTERONE 50 MG/5GM (1%) TD GEL: 5.0000 g | Freq: Every day | TRANSDERMAL | 5 refills | Status: DC

## 2021-01-02 ENCOUNTER — Telehealth: Payer: Self-pay

## 2021-01-02 NOTE — Telephone Encounter (Signed)
PA for Androgel initiated and submitted via Cover My Meds. Key: [id number]

## 2021-01-17 NOTE — Telephone Encounter (Signed)
Approval letter scanned into chart

## 2021-02-06 ENCOUNTER — Other Ambulatory Visit: Payer: Self-pay | Admitting: Family Medicine

## 2021-02-06 NOTE — Telephone Encounter (Signed)
Requested Prescriptions  Pending Prescriptions Disp Refills  . gabapentin (NEURONTIN) 100 MG capsule [Pharmacy Med Name: GABAPENTIN 100 MG CAPSULE] 90 capsule 1    Sig: TAKE ONE CAPSULE BY MOUTH AT BEDTIME     Neurology: Anticonvulsants - gabapentin Passed - 02/06/2021 10:21 AM      Passed - Valid encounter within last 12 months    Recent Outpatient Visits          1 month ago Primary hypertension   [place], Megan P, DO   3 months ago Primary hypertension   [place], Megan P, DO   9 months ago Routine general medical examination at a health care facility   [place], [place], DO   1 year ago Skin lesion of hand   [facility] Valerie Roys, DO   1 year ago Essential hypertension   [facility] Valerie Roys, DO      Future Appointments            In 2 months Valerie Roys, DO [facility], [facility]           '

## 2021-04-13 ENCOUNTER — Other Ambulatory Visit: Payer: Self-pay | Admitting: Family Medicine

## 2021-04-13 DIAGNOSIS — I10 Essential (primary) hypertension: Secondary | ICD-10-CM

## 2021-04-13 NOTE — Telephone Encounter (Signed)
Notes to clinic: medication requested not on current medication list    Requested Prescriptions  Pending Prescriptions Disp Refills   finasteride (PROSCAR) 5 MG tablet [Pharmacy Med Name: FINASTERIDE 5 MG TABLET] 79 tablet     Sig: TAKE ONE TABLET BY MOUTH DAILY      Urology: 5-alpha Reductase Inhibitors Passed - 04/13/2021  8:03 AM      Passed - Valid encounter within last 12 months    Recent Outpatient Visits           3 months ago Primary hypertension   [place], Megan P, DO   5 months ago Primary hypertension   [facility] [place], Megan P, DO   11 months ago Routine general medical examination at a health care facility   [facility], [place], DO   1 year ago Skin lesion of hand   [place], [place], DO   1 year ago Essential hypertension   [place], [place], DO       Future Appointments             In 3 weeks [place], Megan P, DO [place], [facility]              Signed Prescriptions Disp Refills   hydrochlorothiazide (HYDRODIURIL) 25 MG tablet 79 tablet 0    Sig: TAKE ONE TABLET BY MOUTH DAILY      Cardiovascular: Diuretics - Thiazide Passed - 04/13/2021  8:03 AM      Passed - Ca in normal range and within 360 days    Calcium  Date Value Ref Range Status  12/27/2020 9.8 8.7 - 10.2 mg/dL Final   Calcium, Total  Date Value Ref Range Status  11/28/2012 8.8 8.5 - 10.1 mg/dL Final          Passed - Cr in normal range and within 360 days    Creatinine  Date Value Ref Range Status  11/28/2012 0.98 0.60 - 1.30 mg/dL Final   Creatinine, Ser  Date Value Ref Range Status  12/27/2020 0.94 0.76 - 1.27 mg/dL Final    Comment:                   **Effective January 02, 2021 [facility] will begin**                  reporting the 2021 CKD-EPI creatinine equation that                  estimates kidney function without a race variable.            Passed - K in normal range and within 360 days    Potassium  Date Value Ref Range Status  12/27/2020 4.7 3.5 - 5.2 mmol/L Final  11/28/2012 3.9 3.5 - 5.1 mmol/L Final          Passed - Na in normal range and within 360 days    Sodium  Date Value Ref Range Status  12/27/2020 141 134 - 144 mmol/L Final  11/28/2012 137 136 - 145 mmol/L Final          Passed - Last BP in normal range    BP Readings from Last 1 Encounters:  12/27/20 128/86          Passed - Valid encounter within last 6 months    Recent Outpatient Visits           3 months  ago Primary hypertension   [facility] [place], [place], DO   5 months ago Primary hypertension   [facility] [place], [place], DO   11 months ago Routine general medical examination at a health care facility   [place], [place], DO   1 year ago Skin lesion of hand   [place], [place], DO   1 year ago Essential hypertension   [place], [place], DO       Future Appointments             In 3 weeks [place], Megan P, DO [place], [facility]               benazepril (LOTENSIN) 40 MG tablet 30 tablet 0    Sig: TAKE ONE TABLET BY MOUTH DAILY      Cardiovascular:  ACE Inhibitors Passed - 04/13/2021  8:03 AM      Passed - Cr in normal range and within 180 days    Creatinine  Date Value Ref Range Status  11/28/2012 0.98 0.60 - 1.30 mg/dL Final   Creatinine, Ser  Date Value Ref Range Status  12/27/2020 0.94 0.76 - 1.27 mg/dL Final    Comment:                   **Effective January 02, 2021 [facility] will begin**                  reporting the 2021 CKD-EPI creatinine equation that                  estimates kidney function without a race variable.           Passed - K in normal range and within 180 days    Potassium  Date Value Ref Range Status  12/27/2020 4.7 3.5 - 5.2 mmol/L Final  11/28/2012 3.9 3.5 - 5.1 mmol/L Final           Passed - Patient is not pregnant      Passed - Last BP in normal range    BP Readings from Last 1 Encounters:  12/27/20 128/86          Passed - Valid encounter within last 6 months    Recent Outpatient Visits           3 months ago Primary hypertension   [place], Megan P, DO   5 months ago Primary hypertension   [place], Megan P, DO   11 months ago Routine general medical examination at a health care facility   [place], [place], DO   1 year ago Skin lesion of hand   [place], [place], DO   1 year ago Essential hypertension   [place], Barb Merino, DO       Future Appointments             In 3 weeks Wynetta Emery, Barb Merino, DO [facility], [facility]

## 2021-04-13 NOTE — Telephone Encounter (Signed)
Called and left a message asking patient if he is still taking the finasteride, will wait for a response.

## 2021-04-13 NOTE — Telephone Encounter (Signed)
Scheduled 6/30

## 2021-05-04 ENCOUNTER — Ambulatory Visit (INDEPENDENT_AMBULATORY_CARE_PROVIDER_SITE_OTHER): Payer: Managed Care, Other (non HMO) | Admitting: Family Medicine

## 2021-05-04 ENCOUNTER — Other Ambulatory Visit: Payer: Self-pay

## 2021-05-04 ENCOUNTER — Encounter: Payer: Self-pay | Admitting: Family Medicine

## 2021-05-04 VITALS — BP 126/85 | HR 101 | Temp 98.0°F | Ht 69.0 in | Wt 213.0 lb

## 2021-05-04 DIAGNOSIS — Z0000 Encounter for general adult medical examination without abnormal findings: Secondary | ICD-10-CM

## 2021-05-04 DIAGNOSIS — N401 Enlarged prostate with lower urinary tract symptoms: Secondary | ICD-10-CM

## 2021-05-04 DIAGNOSIS — R3911 Hesitancy of micturition: Secondary | ICD-10-CM

## 2021-05-04 DIAGNOSIS — F321 Major depressive disorder, single episode, moderate: Secondary | ICD-10-CM

## 2021-05-04 DIAGNOSIS — I10 Essential (primary) hypertension: Secondary | ICD-10-CM

## 2021-05-04 DIAGNOSIS — E7800 Pure hypercholesterolemia, unspecified: Secondary | ICD-10-CM | POA: Diagnosis not present

## 2021-05-04 DIAGNOSIS — E291 Testicular hypofunction: Secondary | ICD-10-CM

## 2021-05-04 LAB — URINALYSIS, ROUTINE W REFLEX MICROSCOPIC
Bilirubin, UA: NEGATIVE
Glucose, UA: NEGATIVE
Leukocytes,UA: NEGATIVE
Nitrite, UA: NEGATIVE
Protein,UA: NEGATIVE
RBC, UA: NEGATIVE
Specific Gravity, UA: 1.025 (ref 1.005–1.030)
Urobilinogen, Ur: 0.2 mg/dL (ref 0.2–1.0)
pH, UA: 5.5 (ref 5.0–7.5)

## 2021-05-04 LAB — MICROALBUMIN, URINE WAIVED
Creatinine, Urine Waived: 200 mg/dL (ref 10–300)
Microalb, Ur Waived: 10 mg/L (ref 0–19)
Microalb/Creat Ratio: <30 mg/g (ref <30)

## 2021-05-04 LAB — BAYER DCA HB A1C WAIVED: HB A1C (BAYER DCA - WAIVED): 7.3 % — ABNORMAL HIGH (ref <7.0)

## 2021-05-04 NOTE — Assessment & Plan Note (Signed)
Rechecking labs today. Await results. Treat as needed.  °

## 2021-05-04 NOTE — Progress Notes (Signed)
BP 126/85   Pulse (!) 101   Temp 98 F (36.7 C)   Ht 5' 9"  (1.753 m)   Wt 213 lb (96.6 kg)   SpO2 99%   BMI 31.45 kg/m    Subjective:    Patient ID: Jesse Dunlap, male    DOB: 29-Jul-1966, 55 y.o.   MRN: [id number]  HPI: Jesse Dunlap is a 55 y.o. male presenting on 05/04/2021 for comprehensive medical examination. Current medical complaints include:  HYPERTENSION Hypertension status: controlled  Satisfied with current treatment? yes Duration of hypertension: chronic BP monitoring frequency:  not checking BP range:  BP medication side effects:  no Aspirin: yes Recurrent headaches: no Visual changes: no Palpitations: no Dyspnea: no Chest pain: no Lower extremity edema: no Dizzy/lightheaded: no  DEPRESSION Mood status: controlled Satisfied with current treatment?: yes Symptom severity: mild  Duration of current treatment : chronic Side effects: no Medication compliance: excellent compliance Psychotherapy/counseling: no  Previous psychiatric medications: effexor Depressed mood: no Anxious mood: no Anhedonia: no Significant weight loss or gain: no Insomnia: no  Fatigue: yes Feelings of worthlessness or guilt: no Impaired concentration/indecisiveness: no Suicidal ideations: no Hopelessness: no Crying spells: no Depression screen [facility] 2/9 05/04/2021 12/27/2020 10/26/2020 04/26/2020 10/14/2019  Decreased Interest 0 1 0 0 0  Down, Depressed, Hopeless 0 1 0 0 0  PHQ - 2 Score 0 2 0 0 0  Altered sleeping 0 1 0 1 0  Tired, decreased energy 1 2 0 0 0  Change in appetite 1 1 0 0 0  Feeling bad or failure about yourself  1 1 1 1 1   Trouble concentrating 0 0 1 0 0  Moving slowly or fidgety/restless 0 0 0 0 0  Suicidal thoughts 0 0 0 0 0  PHQ-9 Score 3 7 2 2 1   Difficult doing work/chores Not difficult at all Somewhat difficult Not difficult at all Not difficult at all Not difficult at all  Some recent data might be hidden    LOW TESTOSTERONE Duration:  chronic Status: controlled  Satisfied with current treatment:  yes Previous testosterone therapies: androgel Medication side effects:  no Medication compliance: excellent compliance Decreased libido: no Fatigue: no Depressed mood: no Muscle weakness: no Erectile dysfunction: no  Interim Problems from his last visit: no  Depression Screen done today and results listed below:  Depression screen [facility] 2/9 05/04/2021 12/27/2020 10/26/2020 04/26/2020 10/14/2019  Decreased Interest 0 1 0 0 0  Down, Depressed, Hopeless 0 1 0 0 0  PHQ - 2 Score 0 2 0 0 0  Altered sleeping 0 1 0 1 0  Tired, decreased energy 1 2 0 0 0  Change in appetite 1 1 0 0 0  Feeling bad or failure about yourself  1 1 1 1 1   Trouble concentrating 0 0 1 0 0  Moving slowly or fidgety/restless 0 0 0 0 0  Suicidal thoughts 0 0 0 0 0  PHQ-9 Score 3 7 2 2 1   Difficult doing work/chores Not difficult at all Somewhat difficult Not difficult at all Not difficult at all Not difficult at all  Some recent data might be hidden    Past Medical History:  Past Medical History:  Diagnosis Date   Attention deficit disorder    Diabetes mellitus without complication ([place])    prior to gastric sleeve and wt loss   Family history of breast cancer    Hyperlipidemia    Hypertension    prior to gastric sleeve and wt  loss   Hypogonadism in male    Obesity (BMI 35.0-39.9 without comorbidity)    OSA on CPAP     Surgical History:  Past Surgical History:  Procedure Laterality Date   CARDIAC CATHETERIZATION  approx 2000   COLONOSCOPY  approx 2005   had polyp   COLONOSCOPY WITH PROPOFOL N/A 03/16/2016   Procedure: COLONOSCOPY WITH PROPOFOL;  Surgeon: Lucilla Lame, MD;  Location: [place];  Service: Endoscopy;  Laterality: N/A;  CPAP   COLONOSCOPY WITH PROPOFOL N/A 02/29/2020   Procedure: COLONOSCOPY WITH BIOPSYr;  Surgeon: Lucilla Lame, MD;  Location: [place];  Service: Endoscopy;  Laterality: N/A;  sleep  apnea Priority 3   LAPAROSCOPIC GASTRIC SLEEVE RESECTION  04/2013   POLYPECTOMY  03/16/2016   Procedure: POLYPECTOMY;  Surgeon: Lucilla Lame, MD;  Location: [facility];  Service: Endoscopy;;   POLYPECTOMY N/A 02/29/2020   Procedure: POLYPECTOMY;  Surgeon: Lucilla Lame, MD;  Location: [place];  Service: Endoscopy;  Laterality: N/A;   RHINOPLASTY      Medications:  Current Outpatient Medications on File Prior to Visit  Medication Sig   amLODipine (NORVASC) 10 MG tablet Take 1 tablet (10 mg total) by mouth daily.   aspirin EC 81 MG tablet Take 81 mg by mouth daily.   benazepril (LOTENSIN) 40 MG tablet TAKE ONE TABLET BY MOUTH DAILY   hydrochlorothiazide (HYDRODIURIL) 25 MG tablet TAKE ONE TABLET BY MOUTH DAILY   venlafaxine XR (EFFEXOR-XR) 150 MG 24 hr capsule Take 1 capsule (150 mg total) by mouth daily with breakfast.   gabapentin (NEURONTIN) 100 MG capsule TAKE ONE CAPSULE BY MOUTH AT BEDTIME (Patient not taking: Reported on 05/04/2021)   testosterone (ANDROGEL) 50 MG/5GM (1%) GEL Place 5 g onto the skin daily.   No current facility-administered medications on file prior to visit.    Allergies:  Allergies  Allergen Reactions   Tramadol Itching    Social History:  Social History   Socioeconomic History   Marital status: Married    Spouse name: Not on file   Number of children: Not on file   Years of education: Not on file   Highest education level: Not on file  Occupational History   Not on file  Tobacco Use   Smoking status: Former    Packs/day: 1.50    Years: 10.00    Pack years: 15.00    Types: Cigarettes    Quit date: 11/05/1986    Years since quitting: 34.5   Smokeless tobacco: Never  Vaping Use   Vaping Use: Never used  Substance and Sexual Activity   Alcohol use: Yes    Alcohol/week: 4.0 - 5.0 standard drinks    Types: 4 - 5 Glasses of wine per week    Comment:     Drug use: No   Sexual activity: Not Currently  Other Topics Concern   Not  on file  Social History Narrative   Not on file   Social Determinants of Health   Financial Resource Strain: Not on file  Food Insecurity: Not on file  Transportation Needs: Not on file  Physical Activity: Not on file  Stress: Not on file  Social Connections: Not on file  Intimate Partner Violence: Not on file   Social History   Tobacco Use  Smoking Status Former   Packs/day: 1.50   Years: 10.00   Pack years: 15.00   Types: Cigarettes   Quit date: 11/05/1986   Years since quitting: 34.5  Smokeless  Tobacco Never   Social History   Substance and Sexual Activity  Alcohol Use Yes   Alcohol/week: 4.0 - 5.0 standard drinks   Types: 4 - 5 Glasses of wine per week   Comment:      Family History:  Family History  Problem Relation Age of Onset   Diabetes Mother    Breast cancer Mother 14       CHEK2 pos   Atrial fibrillation Mother    Thyroid disease Mother    Diabetes Father    Hypertension Father    Skin cancer Father    Breast cancer Sister 71   Alzheimer's disease Maternal Grandmother    Breast cancer Maternal Grandmother        dx over 75   Prostate cancer Maternal Grandfather        dx in his 38s-70s   Lung cancer Paternal Grandfather    Prostate cancer Other        PGF's father    Past medical history, surgical history, medications, allergies, family history and social history reviewed with patient today and changes made to appropriate areas of the chart.   Review of Systems  Constitutional: Negative.   HENT: Negative.    Eyes: Negative.   Respiratory: Negative.    Cardiovascular: Negative.   Gastrointestinal: Negative.   Genitourinary: Negative.   Musculoskeletal: Negative.   Skin: Negative.   Neurological: Negative.   Endo/Heme/Allergies: Negative.   Psychiatric/Behavioral: Negative.    All other ROS negative except what is listed above and in the HPI.      Objective:    BP 126/85   Pulse (!) 101   Temp 98 F (36.7 C)   Ht 5' 9"  (1.753 m)    Wt 213 lb (96.6 kg)   SpO2 99%   BMI 31.45 kg/m   Wt Readings from Last 3 Encounters:  05/04/21 213 lb (96.6 kg)  12/27/20 212 lb 9.6 oz (96.4 kg)  10/26/20 211 lb 6.4 oz (95.9 kg)    Physical Exam Vitals and nursing note reviewed.  Constitutional:      General: He is not in acute distress.    Appearance: Normal appearance. He is obese. He is not ill-appearing, toxic-appearing or diaphoretic.  HENT:     Head: Normocephalic and atraumatic.     Right Ear: Tympanic membrane, ear canal and external ear normal. There is no impacted cerumen.     Left Ear: Tympanic membrane, ear canal and external ear normal. There is no impacted cerumen.     Nose: Nose normal. No congestion or rhinorrhea.     Mouth/Throat:     Mouth: Mucous membranes are moist.     Pharynx: Oropharynx is clear. No oropharyngeal exudate or posterior oropharyngeal erythema.  Eyes:     General: No scleral icterus.       Right eye: No discharge.        Left eye: No discharge.     Extraocular Movements: Extraocular movements intact.     Conjunctiva/sclera: Conjunctivae normal.     Pupils: Pupils are equal, round, and reactive to light.  Neck:     Vascular: No carotid bruit.  Cardiovascular:     Rate and Rhythm: Normal rate and regular rhythm.     Pulses: Normal pulses.     Heart sounds: No murmur heard.   No friction rub. No gallop.  Pulmonary:     Effort: Pulmonary effort is normal. No respiratory distress.     Breath sounds: Normal breath  sounds. No stridor. No wheezing, rhonchi or rales.  Chest:     Chest wall: No tenderness.  Abdominal:     General: Abdomen is flat. Bowel sounds are normal. There is no distension.     Palpations: Abdomen is soft. There is no mass.     Tenderness: There is no abdominal tenderness. There is no right CVA tenderness, left CVA tenderness, guarding or rebound.     Hernia: No hernia is present.  Genitourinary:    Comments: Genital exam deferred with shared decision  making Musculoskeletal:        General: No swelling, tenderness, deformity or signs of injury.     Cervical back: Normal range of motion and neck supple. No rigidity. No muscular tenderness.     Right lower leg: No edema.     Left lower leg: No edema.  Lymphadenopathy:     Cervical: No cervical adenopathy.  Skin:    General: Skin is warm and dry.     Capillary Refill: Capillary refill takes less than 2 seconds.     Coloration: Skin is not jaundiced or pale.     Findings: No bruising, erythema, lesion or rash.  Neurological:     General: No focal deficit present.     Mental Status: He is alert and oriented to person, place, and time.     Cranial Nerves: No cranial nerve deficit.     Sensory: No sensory deficit.     Motor: No weakness.     Coordination: Coordination normal.     Gait: Gait normal.     Deep Tendon Reflexes: Reflexes normal.  Psychiatric:        Mood and Affect: Mood normal.        Behavior: Behavior normal.        Thought Content: Thought content normal.        Judgment: Judgment normal.    Results for orders placed or performed in visit on 28/36/62  Basic metabolic panel  Result Value Ref Range   Glucose 171 (H) 65 - 99 mg/dL   BUN 19 6 - 24 mg/dL   Creatinine, Ser 0.94 0.76 - 1.27 mg/dL   GFR calc non Af Amer 90 >59 mL/min/1.73   GFR calc Af Amer 104 >59 mL/min/1.73   BUN/Creatinine Ratio 20 9 - 20   Sodium 141 134 - 144 mmol/L   Potassium 4.7 3.5 - 5.2 mmol/L   Chloride 102 96 - 106 mmol/L   CO2 21 20 - 29 mmol/L   Calcium 9.8 8.7 - 10.2 mg/dL  Testosterone, free, total([facility])  Result Value Ref Range   Testosterone 136 (L) 264 - 916 ng/dL   Testosterone, Free 9.4 7.2 - 24.0 pg/mL   Sex Hormone Binding 18.5 (L) 19.3 - 76.4 nmol/L  TSH  Result Value Ref Range   TSH 1.080 0.450 - 4.500 uIU/mL  CBC with Differential/Platelet  Result Value Ref Range   WBC 5.0 3.4 - 10.8 x10E3/uL   RBC 4.79 4.14 - 5.80 x10E6/uL   Hemoglobin 15.6 13.0 - 17.7  g/dL   Hematocrit 44.6 37.5 - 51.0 %   MCV 93 79 - 97 fL   MCH 32.6 26.6 - 33.0 pg   MCHC 35.0 31.5 - 35.7 g/dL   RDW 12.3 11.6 - 15.4 %   Platelets 193 150 - 450 x10E3/uL   Neutrophils 58 Not Estab. %   Lymphs 27 Not Estab. %   Monocytes 11 Not Estab. %   Eos 3 Not Estab. %  Basos 1 Not Estab. %   Neutrophils Absolute 2.9 1.4 - 7.0 x10E3/uL   Lymphocytes Absolute 1.4 0.7 - 3.1 x10E3/uL   Monocytes Absolute 0.6 0.1 - 0.9 x10E3/uL   EOS (ABSOLUTE) 0.2 0.0 - 0.4 x10E3/uL   Basophils Absolute 0.0 0.0 - 0.2 x10E3/uL   Immature Granulocytes 0 Not Estab. %   Immature Grans (Abs) 0.0 0.0 - 0.1 x10E3/uL      Assessment & Plan:   Problem List Items Addressed This Visit       Cardiovascular and Mediastinum   Hypertension    Under good control on current regimen. Continue current regimen. Continue to monitor. Call with any concerns. Refills given. Labs drawn today.        Relevant Orders   Comprehensive metabolic panel   CBC with Differential/Platelet   Urinalysis, Routine w reflex microscopic   Microalbumin, Urine Waived     Endocrine   Hypogonadism in male    Rechecking labs today. Tolerating meds well. Continue to monitor. Await results. Treat as needed.        Relevant Orders   Comprehensive metabolic panel   CBC with Differential/Platelet   Testosterone, free, total([facility])     Genitourinary   Benign prostatic hyperplasia with urinary hesitancy    Rechecking labs today. Await results. Treat as needed.        Relevant Orders   Comprehensive metabolic panel   CBC with Differential/Platelet   PSA   Urinalysis, Routine w reflex microscopic     Other   Depression, major, single episode, moderate ([facility])    Under good control on current regimen. Continue current regimen. Continue to monitor. Call with any concerns. Refills given. Labs drawn today.       Hypercholesteremia    Rechecking labs today. Await results. Treat as needed.        Relevant  Orders   Comprehensive metabolic panel   CBC with Differential/Platelet   Lipid Panel w/o Chol/HDL Ratio   Other Visit Diagnoses     Routine general medical examination at a health care facility    -  Primary   Vaccines up to date. Labs drawn today. Colonoscopy up to date. Continue diet and exercise. Call with any concerns.    Relevant Orders   Comprehensive metabolic panel   CBC with Differential/Platelet   Lipid Panel w/o Chol/HDL Ratio   PSA   TSH   Urinalysis, Routine w reflex microscopic   Microalbumin, Urine Waived   Bayer DCA Hb A1c Waived   Testosterone, free, total([facility])        Discussed aspirin prophylaxis for myocardial infarction prevention and decision was made to continue ASA  LABORATORY TESTING:  Health maintenance labs ordered today as discussed above.   The natural history of prostate cancer and ongoing controversy regarding screening and potential treatment outcomes of prostate cancer has been discussed with the patient. The meaning of a false positive PSA and a false negative PSA has been discussed. He indicates understanding of the limitations of this screening test and wishes  to proceed with screening PSA testing.   IMMUNIZATIONS:   - Tdap: Tetanus vaccination status reviewed: last tetanus booster within 10 years. - Influenza: Up to date - Pneumovax: Up to date - Prevnar: Not applicable - COVID: Up to date - Zostavax vaccine:  Will check with insurance  SCREENING: - Colonoscopy: Up to date  Discussed with patient purpose of the colonoscopy is to detect colon cancer at curable precancerous or early stages  PATIENT COUNSELING:    Sexuality: Discussed sexually transmitted diseases, partner selection, use of condoms, avoidance of unintended pregnancy  and contraceptive alternatives.   Advised to avoid cigarette smoking.  I discussed with the patient that most people either abstain from alcohol or drink within safe limits (<=14/week and  <=4 drinks/occasion for males, <=7/weeks and <= 3 drinks/occasion for females) and that the risk for alcohol disorders and other health effects rises proportionally with the number of drinks per week and how often a drinker exceeds daily limits.  Discussed cessation/primary prevention of drug use and availability of treatment for abuse.   Diet: Encouraged to adjust caloric intake to maintain  or achieve ideal body weight, to reduce intake of dietary saturated fat and total fat, to limit sodium intake by avoiding high sodium foods and not adding table salt, and to maintain adequate dietary potassium and calcium preferably from fresh fruits, vegetables, and low-fat dairy products.    stressed the importance of regular exercise  Injury prevention: Discussed safety belts, safety helmets, smoke detector, smoking near bedding or upholstery.   Dental health: Discussed importance of regular tooth brushing, flossing, and dental visits.   Follow up plan: NEXT PREVENTATIVE PHYSICAL DUE IN 1 YEAR. Return in about 6 months (around 11/03/2021).

## 2021-05-04 NOTE — Assessment & Plan Note (Signed)
Rechecking labs today. Tolerating meds well. Continue to monitor. Await results. Treat as needed.

## 2021-05-04 NOTE — Assessment & Plan Note (Signed)
Under good control on current regimen. Continue current regimen. Continue to monitor. Call with any concerns. Refills given. Labs drawn today.   

## 2021-05-06 LAB — LIPID PANEL W/O CHOL/HDL RATIO
Cholesterol, Total: 245 mg/dL — ABNORMAL HIGH (ref 100–199)
HDL: 56 mg/dL (ref >39)
LDL Chol Calc (NIH): 140 mg/dL — ABNORMAL HIGH (ref 0–99)
Triglycerides: 271 mg/dL — ABNORMAL HIGH (ref 0–149)
VLDL Cholesterol Cal: 49 mg/dL — ABNORMAL HIGH (ref 5–40)

## 2021-05-06 LAB — CBC WITH DIFFERENTIAL/PLATELET
Basophils Absolute: 0.1 10*3/uL (ref 0.0–0.2)
Basos: 1 %
EOS (ABSOLUTE): 0.2 10*3/uL (ref 0.0–0.4)
Eos: 4 %
Hematocrit: 48.2 % (ref 37.5–51.0)
Hemoglobin: 16.7 g/dL (ref 13.0–17.7)
Immature Grans (Abs): 0 10*3/uL (ref 0.0–0.1)
Immature Granulocytes: 0 %
Lymphocytes Absolute: 1.6 10*3/uL (ref 0.7–3.1)
Lymphs: 28 %
MCH: 32.1 pg (ref 26.6–33.0)
MCHC: 34.6 g/dL (ref 31.5–35.7)
MCV: 93 fL (ref 79–97)
Monocytes Absolute: 0.6 10*3/uL (ref 0.1–0.9)
Monocytes: 11 %
Neutrophils Absolute: 3.2 10*3/uL (ref 1.4–7.0)
Neutrophils: 56 %
Platelets: 197 10*3/uL (ref 150–450)
RBC: 5.2 x10E6/uL (ref 4.14–5.80)
RDW: 11.9 % (ref 11.6–15.4)
WBC: 5.6 10*3/uL (ref 3.4–10.8)

## 2021-05-06 LAB — TESTOSTERONE, FREE, TOTAL, SHBG
Sex Hormone Binding: 19 nmol/L — ABNORMAL LOW (ref 19.3–76.4)
Testosterone, Free: 11.3 pg/mL (ref 7.2–24.0)
Testosterone: 225 ng/dL — ABNORMAL LOW (ref 264–916)

## 2021-05-06 LAB — COMPREHENSIVE METABOLIC PANEL
ALT: 37 IU/L (ref 0–44)
AST: 28 IU/L (ref 0–40)
Albumin/Globulin Ratio: 1.6 (ref 1.2–2.2)
Albumin: 4.6 g/dL (ref 3.8–4.9)
Alkaline Phosphatase: 71 IU/L (ref 44–121)
BUN/Creatinine Ratio: 17 (ref 9–20)
BUN: 17 mg/dL (ref 6–24)
Bilirubin Total: 0.7 mg/dL (ref 0.0–1.2)
CO2: 23 mmol/L (ref 20–29)
Calcium: 10.1 mg/dL (ref 8.7–10.2)
Chloride: 99 mmol/L (ref 96–106)
Creatinine, Ser: 1.02 mg/dL (ref 0.76–1.27)
Globulin, Total: 2.8 g/dL (ref 1.5–4.5)
Glucose: 167 mg/dL — ABNORMAL HIGH (ref 65–99)
Potassium: 4.4 mmol/L (ref 3.5–5.2)
Sodium: 140 mmol/L (ref 134–144)
Total Protein: 7.4 g/dL (ref 6.0–8.5)
eGFR: 86 mL/min/{1.73_m2} (ref >59)

## 2021-05-06 LAB — TSH: TSH: 1.27 u[IU]/mL (ref 0.450–4.500)

## 2021-05-06 LAB — PSA: Prostate Specific Ag, Serum: 4.4 ng/mL — ABNORMAL HIGH (ref 0.0–4.0)

## 2021-05-10 ENCOUNTER — Other Ambulatory Visit: Payer: Self-pay | Admitting: Family Medicine

## 2021-05-10 NOTE — Telephone Encounter (Signed)
   Notes to clinic: The original prescription was discontinued on 12/27/2020 by Park Liter P, DO. Renewing this prescription may not be appropriate  Requested Prescriptions  Pending Prescriptions Disp Refills   finasteride (PROSCAR) 5 MG tablet [Pharmacy Med Name: FINASTERIDE 5 MG TABLET] 79 tablet     Sig: TAKE ONE TABLET BY MOUTH DAILY      Urology: 5-alpha Reductase Inhibitors Passed - 05/10/2021 12:49 PM      Passed - Valid encounter within last 12 months    Recent Outpatient Visits           6 days ago Routine general medical examination at a health care facility   [facility], [place], [place]   4 months ago Primary hypertension   [place], [place], DO   6 months ago Primary hypertension   [place], [place], DO   1 year ago Routine general medical examination at a health care facility   [place], [place], DO   1 year ago Skin lesion of hand   [facility] Valerie Roys, DO       Future Appointments             In 5 months Wynetta Emery, Barb Merino, DO [facility], [facility]

## 2021-05-11 NOTE — Telephone Encounter (Signed)
Scheduled 12/28

## 2021-05-11 NOTE — Telephone Encounter (Signed)
Patient called in to inform Tiffany that he is no longer taking this medication in question. Please be advised

## 2021-05-11 NOTE — Telephone Encounter (Signed)
Called and left a message for patient to return my call to see if he is still taking this medication as it was discontinued in February by Dr.Johnson.

## 2021-05-12 ENCOUNTER — Encounter: Payer: Self-pay | Admitting: Family Medicine

## 2021-05-12 NOTE — Progress Notes (Signed)
Please let him know that his labs came out pretty good. His cholesterol went up a little bit and his sugars were a little high so we checked a 3 month average of his sugars. It put him in the diabetic range. Normal is below 5.8. 5.8-6.4 is prediabetes and 6.5+ is diabetes. He came back at 7.3. Right now I'm going to send him some information about watching his diet. No medicine. But I do want to see him in 3 months to recheck it. His prostate marker also went up very very slightly so I want to recheck that when I seen him next time too. Thanks.

## 2021-05-13 ENCOUNTER — Other Ambulatory Visit: Payer: Self-pay | Admitting: Family Medicine

## 2021-05-13 DIAGNOSIS — I10 Essential (primary) hypertension: Secondary | ICD-10-CM

## 2021-05-13 NOTE — Telephone Encounter (Signed)
Future OV 10/12/21 Approved per protocol.  Requested Prescriptions  Pending Prescriptions Disp Refills  . benazepril (LOTENSIN) 40 MG tablet [Pharmacy Med Name: BENAZEPRIL HCL 40 MG TABLET] 66 tablet     Sig: TAKE ONE TABLET BY MOUTH DAILY     Cardiovascular:  ACE Inhibitors Passed - 05/13/2021 10:26 AM      Passed - Cr in normal range and within 180 days    Creatinine  Date Value Ref Range Status  11/28/2012 0.98 0.60 - 1.30 mg/dL Final   Creatinine, Ser  Date Value Ref Range Status  05/04/2021 1.02 0.76 - 1.27 mg/dL Final         Passed - K in normal range and within 180 days    Potassium  Date Value Ref Range Status  05/04/2021 4.4 3.5 - 5.2 mmol/L Final  11/28/2012 3.9 3.5 - 5.1 mmol/L Final         Passed - Patient is not pregnant      Passed - Last BP in normal range    BP Readings from Last 1 Encounters:  05/04/21 126/85         Passed - Valid encounter within last 6 months    Recent Outpatient Visits          1 week ago Routine general medical examination at a health care facility   [facility], Doug[place] P, DO   4 months ago Primary hypertension   [place], [place], DO   6 months ago Primary hypertension   [place], [place], DO   1 year ago Routine general medical examination at a health care facility   [place], [place], DO   1 year ago Skin lesion of hand   [facility] Valerie Roys, DO      Future Appointments            In 5 months Wynetta Emery, Barb Merino, DO [facility], [place]

## 2021-05-30 ENCOUNTER — Telehealth: Payer: Self-pay

## 2021-05-30 ENCOUNTER — Other Ambulatory Visit: Payer: Self-pay

## 2021-05-30 ENCOUNTER — Ambulatory Visit (INDEPENDENT_AMBULATORY_CARE_PROVIDER_SITE_OTHER): Payer: Managed Care, Other (non HMO)

## 2021-05-30 DIAGNOSIS — Z23 Encounter for immunization: Secondary | ICD-10-CM | POA: Diagnosis not present

## 2021-05-30 NOTE — Telephone Encounter (Signed)
Patient scheduled for nurse visit today for shingles vaccine. Advised patient we do not carry COVID vaccine here.

## 2021-05-30 NOTE — Telephone Encounter (Signed)
Copied from [place] [id number]. Topic: Appointment Scheduling - Scheduling Inquiry for Clinic >> May 29, 2021  3:45 PM Oneta Rack wrote: Reason for CRM: patient contacted his insurance to verify his insurance will cover 100% the shingle shot, patient would like to schedule appointment for 05/30/2021 because he is off. Also, patient would like to know if the practice is offering the  COVID Booster 2nd shot,

## 2021-06-04 ENCOUNTER — Other Ambulatory Visit: Payer: Self-pay | Admitting: Family Medicine

## 2021-06-04 NOTE — Telephone Encounter (Signed)
last RF 04/13/21 #[id number] too soon

## 2021-07-03 ENCOUNTER — Other Ambulatory Visit: Payer: Self-pay | Admitting: Family Medicine

## 2021-07-03 NOTE — Telephone Encounter (Signed)
Requested medication (s) are due for refill today: -  Requested medication (s) are on the active medication list: yes-expired  Last refill:  12/07/20  Future visit scheduled: yes  Notes to clinic:  med expired 01/29/21   Requested Prescriptions  Pending Prescriptions Disp Refills   testosterone (ANDROGEL) 50 MG/5GM (1%) GEL [Pharmacy Med Name: TESTOSTERONE 50 MG/5 GRAM GEL] 150 g     Sig: APPLY 5 GRAM ONTO THE SKIN ONCE A DAY     Off-Protocol Failed - 07/03/2021  6:21 AM      Failed - Medication not assigned to a protocol, review manually.      Passed - Valid encounter within last 12 months    Recent Outpatient Visits           2 months ago Routine general medical examination at a health care facility   [facility], [place], DO   6 months ago Primary hypertension   [place], [place], DO   8 months ago Primary hypertension   [place], [place], DO   1 year ago Routine general medical examination at a health care facility   [facility], [place], DO   1 year ago Skin lesion of hand   [facility] Valerie Roys, DO       Future Appointments             In 4 months Wynetta Emery, Barb Merino, DO [facility]

## 2021-08-01 ENCOUNTER — Other Ambulatory Visit: Payer: Self-pay | Admitting: Family Medicine

## 2021-08-01 DIAGNOSIS — I10 Essential (primary) hypertension: Secondary | ICD-10-CM

## 2021-08-30 ENCOUNTER — Other Ambulatory Visit: Payer: Self-pay

## 2021-08-30 ENCOUNTER — Ambulatory Visit (INDEPENDENT_AMBULATORY_CARE_PROVIDER_SITE_OTHER): Payer: Managed Care, Other (non HMO)

## 2021-08-30 DIAGNOSIS — Z23 Encounter for immunization: Secondary | ICD-10-CM

## 2021-09-02 ENCOUNTER — Other Ambulatory Visit: Payer: Self-pay | Admitting: Family Medicine

## 2021-09-02 NOTE — Telephone Encounter (Signed)
Requested Prescriptions  Pending Prescriptions Disp Refills  . gabapentin (NEURONTIN) 100 MG capsule [Pharmacy Med Name: GABAPENTIN 100 MG CAPSULE] 90 capsule 1    Sig: TAKE ONE CAPSULE BY MOUTH AT BEDTIME     Neurology: Anticonvulsants - gabapentin Passed - 09/02/2021 10:21 AM      Passed - Valid encounter within last 12 months    Recent Outpatient Visits          4 months ago Routine general medical examination at a health care facility   [facility], [place], DO   8 months ago Primary hypertension   [place], John [place], DO   10 months ago Primary hypertension   [place], [place], DO   1 year ago Routine general medical examination at a health care facility   [place], [place], DO   1 year ago Skin lesion of hand   [facility] Valerie Roys, DO      Future Appointments            In 2 months Wynetta Emery, Barb Merino, DO [facility], [facility]

## 2021-09-11 ENCOUNTER — Other Ambulatory Visit: Payer: Self-pay | Admitting: Family Medicine

## 2021-09-11 NOTE — Telephone Encounter (Signed)
Requested medication (s) are due for refill today: yes  Requested medication (s) are on the active medication list: yes  Last refill:  07/14/21 150 g  Future visit scheduled: yes  Notes to clinic:  medication not assigned to a protocol   Requested Prescriptions  Pending Prescriptions Disp Refills   testosterone (ANDROGEL) 50 MG/5GM (1%) GEL [Pharmacy Med Name: TESTOSTERONE 50 MG/5 GRAM GEL] 150 g     Sig: APPLY 5 GRAMS (CONTENTS OF ONE PACKET) ONTO THE SKIN ONCE A DAY     Off-Protocol Failed - 09/11/2021 10:40 AM      Failed - Medication not assigned to a protocol, review manually.      Passed - Valid encounter within last 12 months    Recent Outpatient Visits           4 months ago Routine general medical examination at a health care facility   [facility], [place], DO   8 months ago Primary hypertension   [place], [place], DO   10 months ago Primary hypertension   [place], [place], DO   1 year ago Routine general medical examination at a health care facility   [facility], [place], DO   1 year ago Skin lesion of hand   [facility] Valerie Roys, DO       Future Appointments             In 1 month Wynetta Emery, Barb Merino, DO [facility]

## 2021-09-14 NOTE — Telephone Encounter (Signed)
Patient called in say that he have been out of medication for days and need refill ASAP

## 2021-10-24 ENCOUNTER — Other Ambulatory Visit: Payer: Self-pay | Admitting: Family Medicine

## 2021-10-24 NOTE — Telephone Encounter (Signed)
Requested medications are due for refill today.  yes  Requested medications are on the active medications list.  yes  Last refill. 09/15/2021  Future visit scheduled.   yes  Notes to clinic.  Medication not assigned to a protocol. Please review.    Requested Prescriptions  Pending Prescriptions Disp Refills   testosterone (ANDROGEL) 50 MG/5GM (1%) GEL [Pharmacy Med Name: TESTOSTERONE 50 MG/5 GM GEL PKT] 150 g     Sig: APPLY 1 PACKET ONTO THE SKIN ONCE DAILY     Off-Protocol Failed - 10/24/2021  9:52 AM      Failed - Medication not assigned to a protocol, review manually.      Passed - Valid encounter within last 12 months    Recent Outpatient Visits           5 months ago Routine general medical examination at a health care facility   [facility], [place], DO   10 months ago Primary hypertension   [place], [place], DO   12 months ago Primary hypertension   [place], [place], DO   1 year ago Routine general medical examination at a health care facility   [facility], [place], DO   1 year ago Skin lesion of hand   [facility] Valerie Roys, DO       Future Appointments             In 1 week Wynetta Emery, Barb Merino, DO [facility], [facility]

## 2021-11-01 ENCOUNTER — Other Ambulatory Visit: Payer: Self-pay

## 2021-11-01 ENCOUNTER — Ambulatory Visit: Payer: Managed Care, Other (non HMO) | Admitting: Family Medicine

## 2021-11-01 ENCOUNTER — Encounter: Payer: Self-pay | Admitting: Family Medicine

## 2021-11-01 VITALS — BP 117/80 | HR 118 | Temp 98.2°F | Ht 70.0 in | Wt 212.0 lb

## 2021-11-01 DIAGNOSIS — E7800 Pure hypercholesterolemia, unspecified: Secondary | ICD-10-CM | POA: Diagnosis not present

## 2021-11-01 DIAGNOSIS — N401 Enlarged prostate with lower urinary tract symptoms: Secondary | ICD-10-CM | POA: Diagnosis not present

## 2021-11-01 DIAGNOSIS — I10 Essential (primary) hypertension: Secondary | ICD-10-CM | POA: Diagnosis not present

## 2021-11-01 DIAGNOSIS — R3911 Hesitancy of micturition: Secondary | ICD-10-CM

## 2021-11-01 DIAGNOSIS — N182 Chronic kidney disease, stage 2 (mild): Secondary | ICD-10-CM

## 2021-11-01 DIAGNOSIS — E1122 Type 2 diabetes mellitus with diabetic chronic kidney disease: Secondary | ICD-10-CM

## 2021-11-01 DIAGNOSIS — E291 Testicular hypofunction: Secondary | ICD-10-CM

## 2021-11-01 DIAGNOSIS — F321 Major depressive disorder, single episode, moderate: Secondary | ICD-10-CM

## 2021-11-01 LAB — BAYER DCA HB A1C WAIVED: HB A1C (BAYER DCA - WAIVED): 7 % — ABNORMAL HIGH (ref 4.8–5.6)

## 2021-11-01 MED ORDER — HYDROCHLOROTHIAZIDE 25 MG PO TABS: 25.0000 mg | ORAL_TABLET | Freq: Every day | ORAL | 1 refills | Status: DC

## 2021-11-01 MED ORDER — VENLAFAXINE HCL ER 150 MG PO CP24: ORAL_CAPSULE | ORAL | 1 refills | Status: DC

## 2021-11-01 MED ORDER — VENLAFAXINE HCL ER 75 MG PO CP24: 75.0000 mg | ORAL_CAPSULE | Freq: Every day | ORAL | 1 refills | Status: DC

## 2021-11-01 MED ORDER — AMLODIPINE BESYLATE 10 MG PO TABS: 10.0000 mg | ORAL_TABLET | Freq: Every day | ORAL | 1 refills | Status: DC

## 2021-11-01 MED ORDER — BENAZEPRIL HCL 40 MG PO TABS: 40.0000 mg | ORAL_TABLET | Freq: Every day | ORAL | 1 refills | Status: DC

## 2021-11-01 NOTE — Assessment & Plan Note (Signed)
Recheckiabs today. Await results. Treat as needed.

## 2021-11-01 NOTE — Progress Notes (Signed)
BP 117/80    Pulse (!) 118    Temp 98.2 F (36.8 C)    Ht _0  (1.778 m)    Wt 212 lb (96.2 kg)    SpO2 97%    BMI 30.42 kg/m    Subjective:    Patient ID: Jesse Dunlap, male    DOB: [birth year], 55 y.o.   MRN: [id number]  HPI: Jesse Dunlap is a 55 y.o. male  Chief Complaint  Patient presents with   Diabetes   Hypogonadism   Hypertension   HYPERTENSION / HYPERLIPIDEMIA Satisfied with current treatment? yes Duration of hypertension: chronic BP monitoring frequency: not checking BP medication side effects: no Past BP meds: benazepril, amlodipine, HCTZ Duration of hyperlipidemia: chronic Cholesterol medication side effects: not on anything Cholesterol supplements: none Past cholesterol medications: none Medication compliance: excellent compliance Aspirin: yes Recent stressors: no Recurrent headaches: no Visual changes: no Palpitations: no Dyspnea: no Chest pain: no Lower extremity edema: no Dizzy/lightheaded: no  DIABETES Hypoglycemic episodes:no Polydipsia/polyuria: no Visual disturbance: no Chest pain: no Paresthesias: no Glucose Monitoring: no  Accucheck frequency: Not Checking Taking Insulin?: no Blood Pressure Monitoring: not checking Retinal Examination: Not up to Date Foot Exam: Up to Date Diabetic Education: Completed Pneumovax: Up to Date Influenza: Up to Date Aspirin: yes  LOW TESTOSTERONE Duration: chronic Status: stable  Satisfied with current treatment:  yes Previous testosterone therapies: androgel Medication side effects:  no Medication compliance: excellent compliance Decreased libido: no Fatigue: no Depressed mood: no Muscle weakness: no Erectile dysfunction: no  DEPRESSION Mood status: exacerbated Satisfied with current treatment?: no Symptom severity: moderate  Duration of current treatment : chronic Side effects: no Medication compliance: excellent compliance Psychotherapy/counseling: no  Previous psychiatric  medications: effexor Depressed mood: yes Anxious mood: no Anhedonia: no Significant weight loss or gain: no Insomnia: no  Fatigue: yes Feelings of worthlessness or guilt: no Impaired concentration/indecisiveness: no Suicidal ideations: no Hopelessness: no Crying spells: no Depression screen [facility] 2/9 11/01/2021 05/04/2021 12/27/2020 10/26/2020 04/26/2020  Decreased Interest 2 0 1 0 0  Down, Depressed, Hopeless 1 0 1 0 0  PHQ - 2 Score 3 0 2 0 0  Altered sleeping 0 0 1 0 1  Tired, decreased energy 0 1 2 0 0  Change in appetite _1 0 0  Feeling bad or failure about yourself  _2 Trouble concentrating 1 0 0 1 0  Moving slowly or fidgety/restless 1 0 0 0 0  Suicidal thoughts 0 0 0 0 0  PHQ-9 Score _3 Difficult doing work/chores - Not difficult at all Somewhat difficult Not difficult at all Not difficult at all  Some recent data might be hidden    Relevant past medical, surgical, family and social history reviewed and updated as indicated. Interim medical history since our last visit reviewed. Allergies and medications reviewed and updated.  Review of Systems  Constitutional: Negative.   Respiratory: Negative.    Cardiovascular: Negative.   Gastrointestinal: Negative.   Musculoskeletal: Negative.   Neurological: Negative.   Psychiatric/Behavioral: Negative.     Per HPI unless specifically indicated above     Objective:    BP 117/80    Pulse (!) 118    Temp 98.2 F (36.8 C)    Ht _4  (1.778 m)    Wt 212 lb (96.2 kg)    SpO2 97%    BMI 30.42 kg/m   Wt Readings from Last 3 Encounters:  11/01/21 212 lb (96.2 kg)  05/04/21 213 lb (96.6 kg)  12/27/20 212 lb 9.6 oz (96.4 kg)    Physical Exam Vitals and nursing note reviewed.  Constitutional:      General: He is not in acute distress.    Appearance: Normal appearance. He is not ill-appearing, toxic-appearing or diaphoretic.  HENT:     Head: Normocephalic and atraumatic.     Right Ear: External ear normal.      Left Ear: External ear normal.     Nose: Nose normal.     Mouth/Throat:     Mouth: Mucous membranes are moist.     Pharynx: Oropharynx is clear.  Eyes:     General: No scleral icterus.       Right eye: No discharge.        Left eye: No discharge.     Extraocular Movements: Extraocular movements intact.     Conjunctiva/sclera: Conjunctivae normal.     Pupils: Pupils are equal, round, and reactive to light.  Cardiovascular:     Rate and Rhythm: Normal rate and regular rhythm.     Pulses: Normal pulses.     Heart sounds: Normal heart sounds. No murmur heard.   No friction rub. No gallop.  Pulmonary:     Effort: Pulmonary effort is normal. No respiratory distress.     Breath sounds: Normal breath sounds. No stridor. No wheezing, rhonchi or rales.  Chest:     Chest wall: No tenderness.  Musculoskeletal:        General: Normal range of motion.     Cervical back: Normal range of motion and neck supple.  Skin:    General: Skin is warm and dry.     Capillary Refill: Capillary refill takes less than 2 seconds.     Coloration: Skin is not jaundiced or pale.     Findings: No bruising, erythema, lesion or rash.  Neurological:     General: No focal deficit present.     Mental Status: He is alert and oriented to person, place, and time. Mental status is at baseline.  Psychiatric:        Mood and Affect: Mood normal.        Behavior: Behavior normal.        Thought Content: Thought content normal.        Judgment: Judgment normal.    Results for orders placed or performed in visit on 05/04/21  Comprehensive metabolic panel  Result Value Ref Range   Glucose 167 (H) 65 - 99 mg/dL   BUN 17 6 - 24 mg/dL   Creatinine, Ser 1.02 0.76 - 1.27 mg/dL   eGFR 86 >59 mL/min/1.73   BUN/Creatinine Ratio 17 9 - 20   Sodium 140 134 - 144 mmol/L   Potassium 4.4 3.5 - 5.2 mmol/L   Chloride 99 96 - 106 mmol/L   CO2 23 20 - 29 mmol/L   Calcium 10.1 8.7 - 10.2 mg/dL   Total Protein 7.4 6.0 - 8.5  g/dL   Albumin 4.6 3.8 - 4.9 g/dL   Globulin, Total 2.8 1.5 - 4.5 g/dL   Albumin/Globulin Ratio 1.6 1.2 - 2.2   Bilirubin Total 0.7 0.0 - 1.2 mg/dL   Alkaline Phosphatase 71 44 - 121 IU/L   AST 28 0 - 40 IU/L   ALT 37 0 - 44 IU/L  CBC with Differential/Platelet  Result Value Ref Range   WBC 5.6 3.4 - 10.8 x10E3/uL   RBC 5.20 4.14 - 5.80  x10E6/uL   Hemoglobin 16.7 13.0 - 17.7 g/dL   Hematocrit 48.2 37.5 - 51.0 %   MCV 93 79 - 97 fL   MCH 32.1 26.6 - 33.0 pg   MCHC 34.6 31.5 - 35.7 g/dL   RDW 11.9 11.6 - 15.4 %   Platelets 197 150 - 450 x10E3/uL   Neutrophils 56 Not Estab. %   Lymphs 28 Not Estab. %   Monocytes 11 Not Estab. %   Eos 4 Not Estab. %   Basos 1 Not Estab. %   Neutrophils Absolute 3.2 1.4 - 7.0 x10E3/uL   Lymphocytes Absolute 1.6 0.7 - 3.1 x10E3/uL   Monocytes Absolute 0.6 0.1 - 0.9 x10E3/uL   EOS (ABSOLUTE) 0.2 0.0 - 0.4 x10E3/uL   Basophils Absolute 0.1 0.0 - 0.2 x10E3/uL   Immature Granulocytes 0 Not Estab. %   Immature Grans (Abs) 0.0 0.0 - 0.1 x10E3/uL  Lipid Panel w/o Chol/HDL Ratio  Result Value Ref Range   Cholesterol, Total 245 (H) 100 - 199 mg/dL   Triglycerides 271 (H) 0 - 149 mg/dL   HDL 56 >39 mg/dL   VLDL Cholesterol Cal 49 (H) 5 - 40 mg/dL   LDL Chol Calc (NIH) 140 (H) 0 - 99 mg/dL  PSA  Result Value Ref Range   Prostate Specific Ag, Serum 4.4 (H) 0.0 - 4.0 ng/mL  TSH  Result Value Ref Range   TSH 1.270 0.450 - 4.500 uIU/mL  Urinalysis, Routine w reflex microscopic  Result Value Ref Range   Specific Gravity, UA 1.025 1.005 - 1.030   pH, UA 5.5 5.0 - 7.5   Color, UA Yellow Yellow   Appearance Ur Clear Clear   Leukocytes,UA Negative Negative   Protein,UA Negative Negative/Trace   Glucose, UA Negative Negative   Ketones, UA Trace (A) Negative   RBC, UA Negative Negative   Bilirubin, UA Negative Negative   Urobilinogen, Ur 0.2 0.2 - 1.0 mg/dL   Nitrite, UA Negative Negative  Microalbumin, Urine Waived  Result Value Ref Range   Microalb,  Ur Waived 10 0 - 19 mg/L   Creatinine, Urine Waived 200 10 - 300 mg/dL   Microalb/Creat Ratio <30 <30 mg/g  Bayer DCA Hb A1c Waived  Result Value Ref Range   HB A1C (BAYER DCA - WAIVED) 7.3 (H) <7.0 %  Testosterone, free, total([facility])  Result Value Ref Range   Testosterone 225 (L) 264 - 916 ng/dL   Testosterone, Free 11.3 7.2 - 24.0 pg/mL   Sex Hormone Binding 19.0 (L) 19.3 - 76.4 nmol/L      Assessment & Plan:   Problem List Items Addressed This Visit       Cardiovascular and Mediastinum   Hypertension    Under good control on current regimen. Continue current regimen. Continue to monitor. Call with any concerns. Refills given. Labs drawn today.        Relevant Medications   amLODipine (NORVASC) 10 MG tablet   benazepril (LOTENSIN) 40 MG tablet   hydrochlorothiazide (HYDRODIURIL) 25 MG tablet   Other Relevant Orders   CBC with Differential/Platelet   Comprehensive metabolic panel     Endocrine   Type 2 diabetes mellitus with stage 2 chronic kidney disease, without long-term current use of insulin (HCC)    Doing better with A1c of 7.0 down from 7.3- will continue diet and exercise and recheck 3 months. Call with any concerns.       Relevant Medications   benazepril (LOTENSIN) 40 MG tablet   Other  Relevant Orders   Bayer DCA Hb A1c Waived   CBC with Differential/Platelet   Comprehensive metabolic panel   Hypogonadism in male - Primary    Recheckiabs today. Await results. Treat as needed.       Relevant Orders   CBC with Differential/Platelet   Comprehensive metabolic panel   Testosterone, free, total([facility])     Genitourinary   Benign prostatic hyperplasia with urinary hesitancy    Rechecking labs today. Await results. Treat as needed.       Relevant Orders   CBC with Differential/Platelet   Comprehensive metabolic panel   PSA     Other   Depression, major, single episode, moderate ([place])    Not doing great. Will increase his effexor  to 21m and recheck 3 months. Call with any concerns.       Relevant Medications   venlafaxine XR (EFFEXOR-XR) 150 MG 24 hr capsule   venlafaxine XR (EFFEXOR XR) 75 MG 24 hr capsule   Hypercholesteremia    Under good control on current regimen. Continue current regimen. Continue to monitor. Call with any concerns. Refills given. Labs drawn today.       Relevant Medications   amLODipine (NORVASC) 10 MG tablet   benazepril (LOTENSIN) 40 MG tablet   hydrochlorothiazide (HYDRODIURIL) 25 MG tablet   Other Relevant Orders   CBC with Differential/Platelet   Comprehensive metabolic panel   Lipid Panel w/o Chol/HDL Ratio   Other Visit Diagnoses     Essential hypertension       Relevant Medications   amLODipine (NORVASC) 10 MG tablet   benazepril (LOTENSIN) 40 MG tablet   hydrochlorothiazide (HYDRODIURIL) 25 MG tablet        Follow up plan: Return in about 3 months (around 01/30/2022).

## 2021-11-01 NOTE — Assessment & Plan Note (Signed)
Under good control on current regimen. Continue current regimen. Continue to monitor. Call with any concerns. Refills given. Labs drawn today.   

## 2021-11-01 NOTE — Assessment & Plan Note (Signed)
Not doing great. Will increase his effexor to 225mg  and recheck 3 months. Call with any concerns.

## 2021-11-01 NOTE — Assessment & Plan Note (Signed)
Doing better with A1c of 7.0 down from 7.3- will continue diet and exercise and recheck 3 months. Call with any concerns.

## 2021-11-01 NOTE — Assessment & Plan Note (Signed)
Rechecking labs today. Await results. Treat as needed.  °

## 2021-11-02 ENCOUNTER — Other Ambulatory Visit: Payer: Self-pay | Admitting: Family Medicine

## 2021-11-02 MED ORDER — TESTOSTERONE 50 MG/5GM (1%) TD GEL: TRANSDERMAL | 5 refills | Status: DC

## 2021-11-03 LAB — CBC WITH DIFFERENTIAL/PLATELET
Basophils Absolute: 0 10*3/uL (ref 0.0–0.2)
Basos: 1 %
EOS (ABSOLUTE): 0.2 10*3/uL (ref 0.0–0.4)
Eos: 3 %
Hematocrit: 48.8 % (ref 37.5–51.0)
Hemoglobin: 16.9 g/dL (ref 13.0–17.7)
Immature Grans (Abs): 0 10*3/uL (ref 0.0–0.1)
Immature Granulocytes: 0 %
Lymphocytes Absolute: 1.4 10*3/uL (ref 0.7–3.1)
Lymphs: 26 %
MCH: 32.6 pg (ref 26.6–33.0)
MCHC: 34.6 g/dL (ref 31.5–35.7)
MCV: 94 fL (ref 79–97)
Monocytes Absolute: 0.6 10*3/uL (ref 0.1–0.9)
Monocytes: 11 %
Neutrophils Absolute: 3.1 10*3/uL (ref 1.4–7.0)
Neutrophils: 59 %
Platelets: 223 10*3/uL (ref 150–450)
RBC: 5.18 x10E6/uL (ref 4.14–5.80)
RDW: 12.5 % (ref 11.6–15.4)
WBC: 5.3 10*3/uL (ref 3.4–10.8)

## 2021-11-03 LAB — COMPREHENSIVE METABOLIC PANEL
ALT: 41 IU/L (ref 0–44)
AST: 28 IU/L (ref 0–40)
Albumin/Globulin Ratio: 1.6 (ref 1.2–2.2)
Albumin: 4.5 g/dL (ref 3.8–4.9)
Alkaline Phosphatase: 77 IU/L (ref 44–121)
BUN/Creatinine Ratio: 14 (ref 9–20)
BUN: 16 mg/dL (ref 6–24)
Bilirubin Total: 0.6 mg/dL (ref 0.0–1.2)
CO2: 26 mmol/L (ref 20–29)
Calcium: 9.7 mg/dL (ref 8.7–10.2)
Chloride: 96 mmol/L (ref 96–106)
Creatinine, Ser: 1.15 mg/dL (ref 0.76–1.27)
Globulin, Total: 2.9 g/dL (ref 1.5–4.5)
Glucose: 324 mg/dL — ABNORMAL HIGH (ref 70–99)
Potassium: 4 mmol/L (ref 3.5–5.2)
Sodium: 137 mmol/L (ref 134–144)
Total Protein: 7.4 g/dL (ref 6.0–8.5)
eGFR: 74 mL/min/{1.73_m2} (ref >59)

## 2021-11-03 LAB — LIPID PANEL W/O CHOL/HDL RATIO
Cholesterol, Total: 252 mg/dL — ABNORMAL HIGH (ref 100–199)
HDL: 59 mg/dL (ref >39)
LDL Chol Calc (NIH): 139 mg/dL — ABNORMAL HIGH (ref 0–99)
Triglycerides: 301 mg/dL — ABNORMAL HIGH (ref 0–149)
VLDL Cholesterol Cal: 54 mg/dL — ABNORMAL HIGH (ref 5–40)

## 2021-11-03 LAB — PSA: Prostate Specific Ag, Serum: 4.4 ng/mL — ABNORMAL HIGH (ref 0.0–4.0)

## 2021-11-03 LAB — TESTOSTERONE, FREE, TOTAL, SHBG
Sex Hormone Binding: 19.9 nmol/L (ref 19.3–76.4)
Testosterone, Free: 15.6 pg/mL (ref 7.2–24.0)
Testosterone: 299 ng/dL (ref 264–916)

## 2021-12-27 ENCOUNTER — Telehealth: Payer: Self-pay | Admitting: Family Medicine

## 2021-12-27 NOTE — Telephone Encounter (Signed)
Medication Refill - Medication:testosterone (ANDROGEL) 50 MG/5GM (1%) GEL   Has the patient contacted their pharmacy? yes (Agent: If no, request that the patient contact the pharmacy for the refill. If patient does not wish to contact the pharmacy document the reason why and proceed with request.) (Agent: If yes, when and what did the pharmacy advise?)contact pcp  Preferred Pharmacy (with phone number or street name): [place], [place]. Phone:  [phone]  Fax:  [phone]     Has the patient been seen for an appointment in the last year OR does the patient have an upcoming appointment? yes  Agent: Please be advised that RX refills may take up to 3 business days. We ask that you follow-up with your pharmacy.

## 2021-12-27 NOTE — Telephone Encounter (Signed)
Requested medication (s) are due for refill today: has rx at different pharm 11/02/21 150g with 5 RF  Requested medication (s) are on the active medication list: yes  Last refill:  11/02/21 150g with 5 RF  Future visit scheduled: 01/30/22  Notes to clinic:  has current rx at different pharm, there is not a protocol to follow for this med,  please assess.      Requested Prescriptions  Pending Prescriptions Disp Refills   testosterone (ANDROGEL) 50 MG/5GM (1%) GEL 150 g 5    Sig: APPLY 1 PACKET ONTO THE SKIN ONCE DAILY     Off-Protocol Failed - 12/27/2021  2:12 PM      Failed - Medication not assigned to a protocol, review manually.      Passed - Valid encounter within last 12 months    Recent Outpatient Visits           1 month ago Hypogonadism in male   [place], [place], DO   7 months ago Routine general medical examination at a health care facility   [place], [place], DO   1 year ago Primary hypertension   [place], [place], DO   1 year ago Primary hypertension   [place], [place], DO   1 year ago Routine general medical examination at a health care facility   [place], Barb Merino, DO       Future Appointments             In 1 month Wynetta Emery, Barb Merino, DO [facility], [place]

## 2022-01-01 NOTE — Telephone Encounter (Signed)
Pt called saying he has checked with [facility] and they said they have not received this prescription.

## 2022-01-02 ENCOUNTER — Other Ambulatory Visit: Payer: Self-pay

## 2022-01-02 MED ORDER — TESTOSTERONE 50 MG/5GM (1%) TD GEL: TRANSDERMAL | 5 refills | Status: DC

## 2022-01-02 NOTE — Telephone Encounter (Signed)
-----   Message from Fabian Sharp sent at 01/02/2022  2:09 PM EST ----- Patient came in office asking for an update on prescription sent to the pharmacy.  He no longer uses [facility] for prescriptions.  Please send prescription to [facility] in [place], [place].  He has been without medication for a few weeks.

## 2022-01-23 ENCOUNTER — Telehealth (INDEPENDENT_AMBULATORY_CARE_PROVIDER_SITE_OTHER): Payer: Managed Care, Other (non HMO) | Admitting: Internal Medicine

## 2022-01-23 ENCOUNTER — Encounter: Payer: Self-pay | Admitting: Internal Medicine

## 2022-01-23 DIAGNOSIS — J069 Acute upper respiratory infection, unspecified: Secondary | ICD-10-CM

## 2022-01-23 MED ORDER — OSELTAMIVIR PHOSPHATE 75 MG PO CAPS: 75.0000 mg | ORAL_CAPSULE | Freq: Two times a day (BID) | ORAL | 0 refills | Status: AC

## 2022-01-23 MED ORDER — FEXOFENADINE HCL 180 MG PO TABS: 180.0000 mg | ORAL_TABLET | Freq: Every day | ORAL | 1 refills | Status: DC

## 2022-01-23 NOTE — Progress Notes (Signed)
? ?There were no vitals taken for this visit.  ? ?Subjective:  ? ? Patient ID: Jesse Dunlap, male    DOB: 1966/03/10, 56 y.o.   MRN: [id number] ? ?Chief Complaint  ?Patient presents with  ? Cough  ? Generalized Body Aches  ?  Patient states he became symptomatic Sunday with the cough and the yesterday started with all his other symptoms. Patient states today his cough is a little better, but he is still coughing up phlegm. Patient states he has tried over the counter medications and says he can't really tell if the medicine is helping. Patient states he did test at-home yesterday and it was negative.   ? Nasal Congestion  ? ? ?HPI: ?Jesse Dunlap is a 56 y.o. male ? ? ?This visit was completed via video visit through MyChart due to the restrictions of the COVID-19 pandemic. All issues as above were discussed and addressed. Physical exam was done as above through visual confirmation on video through MyChart. If it was felt that the patient should be evaluated in the office, they were directed there. The patient verbally consented to this visit. ?Location of the patient: home ?Location of the provider:work ?Those involved with this call:  ?Provider: Charlynne Cousins, MD ?CMA: Irena Reichmann, [place] ?Front Desk/Registration: [employer]  ?Time spent on call: 10 minutes with patient face to face via video conference. More than 50% of this time was spent in counseling and coordination of care. 10 minutes total spent in review of patient's record and preparation of their chart. ? ?Home covid -ve ?Patient presents with: ?Cough ?Generalized Body Aches: Patient states he became symptomatic Sunday with the cough and the yesterday started with all his other symptoms. Patient states today his cough is a little better, but he is still coughing up phlegm. Patient states he has tried over the counter medications and says he can't really tell if the medicine is helping. Patient states he did test at-home yesterday and it was negative.   ?Nasal Congestion ? ? ? ?Cough ?This is a new problem. Associated symptoms include chills, myalgias, nasal congestion, postnasal drip and a sore throat. Pertinent negatives include no ear congestion, ear pain, fever, headaches, heartburn, rash, rhinorrhea, shortness of breath, sweats, weight loss or wheezing. Associated symptoms comments: Started Sunday  ?This morning cough is better .  ? ?Chief Complaint  ?Patient presents with  ? Cough  ? Generalized Body Aches  ?  Patient states he became symptomatic Sunday with the cough and the yesterday started with all his other symptoms. Patient states today his cough is a little better, but he is still coughing up phlegm. Patient states he has tried over the counter medications and says he can't really tell if the medicine is helping. Patient states he did test at-home yesterday and it was negative.   ? Nasal Congestion  ? ? ?Relevant past medical, surgical, family and social history reviewed and updated as indicated. Interim medical history since our last visit reviewed. ?Allergies and medications reviewed and updated. ? ?Review of Systems  ?Constitutional:  Positive for chills. Negative for fever and weight loss.  ?HENT:  Positive for postnasal drip and sore throat. Negative for ear pain and rhinorrhea.   ?Respiratory:  Positive for cough. Negative for shortness of breath and wheezing.   ?Gastrointestinal:  Negative for heartburn.  ?Musculoskeletal:  Positive for myalgias.  ?Skin:  Negative for rash.  ?Neurological:  Negative for headaches.  ? ?Per HPI unless specifically indicated above ? ?   ?  Objective:  ?  ?There were no vitals taken for this visit.  ?Wt Readings from Last 3 Encounters:  ?11/01/21 212 lb (96.2 kg)  ?05/04/21 213 lb (96.6 kg)  ?12/27/20 212 lb 9.6 oz (96.4 kg)  ?  ?Physical Exam ? ?Results for orders placed or performed in visit on 11/01/21  ?Bayer DCA Hb A1c Waived  ?Result Value Ref Range  ? HB A1C (BAYER DCA - WAIVED) 7.0 (H) 4.8 - 5.6 %  ?CBC with  Differential/Platelet  ?Result Value Ref Range  ? WBC 5.3 3.4 - 10.8 x10E3/uL  ? RBC 5.18 4.14 - 5.80 x10E6/uL  ? Hemoglobin 16.9 13.0 - 17.7 g/dL  ? Hematocrit 48.8 37.5 - 51.0 %  ? MCV 94 79 - 97 fL  ? MCH 32.6 26.6 - 33.0 pg  ? MCHC 34.6 31.5 - 35.7 g/dL  ? RDW 12.5 11.6 - 15.4 %  ? Platelets 223 150 - 450 x10E3/uL  ? Neutrophils 59 Not Estab. %  ? Lymphs 26 Not Estab. %  ? Monocytes 11 Not Estab. %  ? Eos 3 Not Estab. %  ? Basos 1 Not Estab. %  ? Neutrophils Absolute 3.1 1.4 - 7.0 x10E3/uL  ? Lymphocytes Absolute 1.4 0.7 - 3.1 x10E3/uL  ? Monocytes Absolute 0.6 0.1 - 0.9 x10E3/uL  ? EOS (ABSOLUTE) 0.2 0.0 - 0.4 x10E3/uL  ? Basophils Absolute 0.0 0.0 - 0.2 x10E3/uL  ? Immature Granulocytes 0 Not Estab. %  ? Immature Grans (Abs) 0.0 0.0 - 0.1 x10E3/uL  ?Comprehensive metabolic panel  ?Result Value Ref Range  ? Glucose 324 (H) 70 - 99 mg/dL  ? BUN 16 6 - 24 mg/dL  ? Creatinine, Ser 1.15 0.76 - 1.27 mg/dL  ? eGFR 74 >59 mL/min/1.73  ? BUN/Creatinine Ratio 14 9 - 20  ? Sodium 137 134 - 144 mmol/L  ? Potassium 4.0 3.5 - 5.2 mmol/L  ? Chloride 96 96 - 106 mmol/L  ? CO2 26 20 - 29 mmol/L  ? Calcium 9.7 8.7 - 10.2 mg/dL  ? Total Protein 7.4 6.0 - 8.5 g/dL  ? Albumin 4.5 3.8 - 4.9 g/dL  ? Globulin, Total 2.9 1.5 - 4.5 g/dL  ? Albumin/Globulin Ratio 1.6 1.2 - 2.2  ? Bilirubin Total 0.6 0.0 - 1.2 mg/dL  ? Alkaline Phosphatase 77 44 - 121 IU/L  ? AST 28 0 - 40 IU/L  ? ALT 41 0 - 44 IU/L  ?Lipid Panel w/o Chol/HDL Ratio  ?Result Value Ref Range  ? Cholesterol, Total 252 (H) 100 - 199 mg/dL  ? Triglycerides 301 (H) 0 - 149 mg/dL  ? HDL 59 >39 mg/dL  ? VLDL Cholesterol Cal 54 (H) 5 - 40 mg/dL  ? LDL Chol Calc (NIH) 139 (H) 0 - 99 mg/dL  ?Testosterone, free, total([facility])  ?Result Value Ref Range  ? Testosterone 299 264 - 916 ng/dL  ? Testosterone, Free 15.6 7.2 - 24.0 pg/mL  ? Sex Hormone Binding 19.9 19.3 - 76.4 nmol/L  ?PSA  ?Result Value Ref Range  ? Prostate Specific Ag, Serum 4.4 (H) 0.0 - 4.0 ng/mL  ? ?    ? ? ?Current Outpatient Medications:  ?  amLODipine (NORVASC) 10 MG tablet, Take 1 tablet (10 mg total) by mouth daily., Disp: 90 tablet, Rfl: 1 ?  aspirin EC 81 MG tablet, Take 81 mg by mouth daily., Disp: , Rfl:  ?  benazepril (LOTENSIN) 40 MG tablet, Take 1 tablet (40 mg total) by mouth daily., Disp: 90 tablet,  Rfl: 1 ?  fexofenadine (ALLEGRA ALLERGY) 180 MG tablet, Take 1 tablet (180 mg total) by mouth daily., Disp: 10 tablet, Rfl: 1 ?  hydrochlorothiazide (HYDRODIURIL) 25 MG tablet, Take 1 tablet (25 mg total) by mouth daily., Disp: 90 tablet, Rfl: 1 ?  oseltamivir (TAMIFLU) 75 MG capsule, Take 1 capsule (75 mg total) by mouth 2 (two) times daily for 5 days., Disp: 10 capsule, Rfl: 0 ?  testosterone (ANDROGEL) 50 MG/5GM (1%) GEL, APPLY 1 PACKET ONTO THE SKIN ONCE DAILY, Disp: 150 g, Rfl: 5 ?  venlafaxine XR (EFFEXOR XR) 75 MG 24 hr capsule, Take 1 capsule (75 mg total) by mouth daily with breakfast. To be taken with 164m for 2268mtotal daily, Disp: 90 capsule, Rfl: 1 ?  venlafaxine XR (EFFEXOR-XR) 150 MG 24 hr capsule, TAKE ONE CAPSULE BY MOUTH DAILY WITH BREAKFAST To be taken with 7541mor 225m73mtal daily, Disp: 90 capsule, Rfl: 1 ?  gabapentin (NEURONTIN) 100 MG capsule, TAKE ONE CAPSULE BY MOUTH AT BEDTIME (Patient not taking: Reported on 11/01/2021), Disp: 90 capsule, Rfl: 1  ? ? ?Assessment & Plan:  ?URI: COVID , Flu and strep ordered at this visit, to come in for a curbside swab of these.  ?pt advised to take Tylenol q 4- 6 hourly as needed. pt to take allegra q pm as needed and to call office if symptoms worsened pt verbalised understanding of such. ? ? ?Problem List Items Addressed This Visit   ?None ?Visit Diagnoses   ? ? Viral upper respiratory tract infection    -  Primary  ? Relevant Medications  ? oseltamivir (TAMIFLU) 75 MG capsule  ? Upper respiratory tract infection, unspecified type      ? Relevant Medications  ? oseltamivir (TAMIFLU) 75 MG capsule  ? Other Relevant Orders  ? Novel  Coronavirus, NAA ([facility])  ? Veritor Flu A/B Waived  ? Rapid Strep screen([facility])  ? ?  ?  ? ?Orders Placed This Encounter  ?Procedures  ? Novel Coronavirus, NAA ([facility])  ? Rapid Strep screen([facility]/

## 2022-01-25 LAB — NOVEL CORONAVIRUS, NAA: SARS-CoV-2, NAA: NOT DETECTED

## 2022-01-27 LAB — CULTURE, GROUP A STREP: Strep A Culture: NEGATIVE

## 2022-01-27 LAB — VERITOR FLU A/B WAIVED
Influenza A: POSITIVE — AB
Influenza B: NEGATIVE

## 2022-01-27 LAB — RAPID STREP SCREEN (MED CTR MEBANE ONLY): Strep Gp A Ag, IA W/Reflex: NEGATIVE

## 2022-01-29 NOTE — Progress Notes (Signed)
? ?BP (!) 148/89   Pulse 89   Temp 98.4 ?F (36.9 ?C)   Wt 216 lb (98 kg)   SpO2 97%   BMI 30.99 kg/m?   ? ?Subjective:  ? ? Patient ID: Jesse Dunlap, male    DOB: [birth year], 56 y.o.   MRN: [id number] ? ?HPI: ?Jesse Dunlap is a 56 y.o. male ? ?Chief Complaint  ?Patient presents with  ? Diabetes  ?  Patient has not scheduled eye appointment yet, but will now that he has eye insurance   ? ?DIABETES ?Hypoglycemic episodes:no ?Polydipsia/polyuria: no ?Visual disturbance: no ?Chest pain: no ?Paresthesias: no ?Glucose Monitoring: no ? Accucheck frequency: Not Checking ?Taking Insulin?: no ?Blood Pressure Monitoring: not checking ?Retinal Examination: Not up to Date ?Foot Exam: Up to Date ?Diabetic Education: Completed ?Pneumovax: Up to Date ?Influenza: Up to Date ?Aspirin: no ? ?Relevant past medical, surgical, family and social history reviewed and updated as indicated. Interim medical history since our last visit reviewed. ?Allergies and medications reviewed and updated. ? ?Review of Systems  ?Constitutional: Negative.   ?Respiratory: Negative.    ?Cardiovascular: Negative.   ?Musculoskeletal: Negative.   ?Neurological: Negative.   ?Psychiatric/Behavioral: Negative.    ? ?Per HPI unless specifically indicated above ? ?   ?Objective:  ?  ?BP (!) 148/89   Pulse 89   Temp 98.4 ?F (36.9 ?C)   Wt 216 lb (98 kg)   SpO2 97%   BMI 30.99 kg/m?   ?Wt Readings from Last 3 Encounters:  ?01/30/22 216 lb (98 kg)  ?11/01/21 212 lb (96.2 kg)  ?05/04/21 213 lb (96.6 kg)  ?  ?Physical Exam ?Vitals and nursing note reviewed.  ?Constitutional:   ?   General: He is not in acute distress. ?   Appearance: Normal appearance. He is not ill-appearing, toxic-appearing or diaphoretic.  ?HENT:  ?   Head: Normocephalic and atraumatic.  ?   Right Ear: External ear normal.  ?   Left Ear: External ear normal.  ?   Nose: Nose normal.  ?   Mouth/Throat:  ?   Mouth: Mucous membranes are moist.  ?   Pharynx: Oropharynx is clear.  ?Eyes:  ?    General: No scleral icterus.    ?   Right eye: No discharge.     ?   Left eye: No discharge.  ?   Extraocular Movements: Extraocular movements intact.  ?   Conjunctiva/sclera: Conjunctivae normal.  ?   Pupils: Pupils are equal, round, and reactive to light.  ?Cardiovascular:  ?   Rate and Rhythm: Normal rate and regular rhythm.  ?   Pulses: Normal pulses.  ?   Heart sounds: Normal heart sounds. No murmur heard. ?  No friction rub. No gallop.  ?Pulmonary:  ?   Effort: Pulmonary effort is normal. No respiratory distress.  ?   Breath sounds: Normal breath sounds. No stridor. No wheezing, rhonchi or rales.  ?Chest:  ?   Chest wall: No tenderness.  ?Musculoskeletal:     ?   General: Normal range of motion.  ?   Cervical back: Normal range of motion and neck supple.  ?Skin: ?   General: Skin is warm and dry.  ?   Capillary Refill: Capillary refill takes less than 2 seconds.  ?   Coloration: Skin is not jaundiced or pale.  ?   Findings: No bruising, erythema, lesion or rash.  ?Neurological:  ?   General: No focal deficit present.  ?  Mental Status: He is alert and oriented to person, place, and time. Mental status is at baseline.  ?Psychiatric:     ?   Mood and Affect: Mood normal.     ?   Behavior: Behavior normal.     ?   Thought Content: Thought content normal.     ?   Judgment: Judgment normal.  ? ? ?Results for orders placed or performed in visit on 01/30/22  ?Bayer DCA Hb A1c Waived  ?Result Value Ref Range  ? HB A1C (BAYER DCA - WAIVED) 6.7 (H) 4.8 - 5.6 %  ? ?   ?Assessment & Plan:  ? ?Problem List Items Addressed This Visit   ? ?  ? Cardiovascular and Mediastinum  ? Hypertension  ?  Doing great with A1c of 6.7. Continue diet and exercise. Call with any concerns.  ?  ?  ?  ? Endocrine  ? Type 2 diabetes mellitus with stage 2 chronic kidney disease, without long-term current use of insulin ([place]) - Primary  ? Relevant Orders  ? Bayer DCA Hb A1c Waived (Completed)  ? ?Other Visit Diagnoses   ? ? Wheezing      ? After  the flu. Will treat with albuterol. Call with any concerns.   ? ?  ?  ? ?Follow up plan: ?Return in about 3 months (around 05/02/2022) for physical (after 6/30). ? ? ? ? ? ?

## 2022-01-30 ENCOUNTER — Encounter: Payer: Self-pay | Admitting: Family Medicine

## 2022-01-30 ENCOUNTER — Ambulatory Visit: Payer: Managed Care, Other (non HMO) | Admitting: Family Medicine

## 2022-01-30 ENCOUNTER — Other Ambulatory Visit: Payer: Self-pay

## 2022-01-30 VITALS — BP 148/89 | HR 89 | Temp 98.4°F | Wt 216.0 lb

## 2022-01-30 DIAGNOSIS — E1122 Type 2 diabetes mellitus with diabetic chronic kidney disease: Secondary | ICD-10-CM

## 2022-01-30 DIAGNOSIS — I10 Essential (primary) hypertension: Secondary | ICD-10-CM

## 2022-01-30 DIAGNOSIS — N182 Chronic kidney disease, stage 2 (mild): Secondary | ICD-10-CM | POA: Diagnosis not present

## 2022-01-30 DIAGNOSIS — R062 Wheezing: Secondary | ICD-10-CM

## 2022-01-30 LAB — BAYER DCA HB A1C WAIVED: HB A1C (BAYER DCA - WAIVED): 6.7 % — ABNORMAL HIGH (ref 4.8–5.6)

## 2022-01-30 MED ORDER — ALBUTEROL SULFATE HFA 108 (90 BASE) MCG/ACT IN AERS: 2.0000 | INHALATION_SPRAY | Freq: Four times a day (QID) | RESPIRATORY_TRACT | 0 refills | Status: DC | PRN

## 2022-01-30 NOTE — Assessment & Plan Note (Signed)
Doing great with A1c of 6.7. Continue diet and exercise. Call with any concerns.  ?

## 2022-02-10 ENCOUNTER — Other Ambulatory Visit: Payer: Self-pay | Admitting: Family Medicine

## 2022-02-10 DIAGNOSIS — I10 Essential (primary) hypertension: Secondary | ICD-10-CM

## 2022-02-12 NOTE — Telephone Encounter (Signed)
Requested Prescriptions  ?Pending Prescriptions Disp Refills  ?? gabapentin (NEURONTIN) 100 MG capsule [Pharmacy Med Name: GABAPENTIN 100 MG CAP] 90 capsule 1  ?  Sig: TAKE 1 CAPSULE BY MOUTH AT BEDTIME  ?  ? Neurology: Anticonvulsants - gabapentin Passed - 02/10/2022  3:20 PM  ?  ?  Passed - Cr in normal range and within 360 days  ?  Creatinine  ?Date Value Ref Range Status  ?11/28/2012 0.98 0.60 - 1.30 mg/dL Final  ? ?Creatinine, Ser  ?Date Value Ref Range Status  ?11/01/2021 1.15 0.76 - 1.27 mg/dL Final  ?   ?  ?  Passed - Completed PHQ-2 or PHQ-9 in the last 360 days  ?  ?  Passed - Valid encounter within last 12 months  ?  Recent Outpatient Visits   ?      ? 1 week ago Type 2 diabetes mellitus with stage 2 chronic kidney disease, without long-term current use of insulin ([place])  ? [place], [place] P, DO  ? 2 weeks ago Viral upper respiratory tract infection  ? [facility] Vigg, Avanti, MD  ? 3 months ago Hypogonadism in male  ? [place], DO  ? 9 months ago Routine general medical examination at a health care facility  ? [place], Megan P, DO  ? 1 year ago Primary hypertension  ? [place], [place] P, DO  ?  ?  ?Future Appointments   ?        ? In 3 months Johnson, Megan P, DO [facility]  ?  ? ?  ?  ?  ?? amLODipine (NORVASC) 10 MG tablet [Pharmacy Med Name: AMLODIPINE BESYLATE 10 MG TAB] 90 tablet 1  ?  Sig: TAKE 1 TABLET BY MOUTH ONCE DAILY  ?  ? Cardiovascular: Calcium Channel Blockers 2 Failed - 02/10/2022  3:20 PM  ?  ?  Failed - Last BP in normal range  ?  BP Readings from Last 1 Encounters:  ?01/30/22 (!) 148/89  ?   ?  ?  Passed - Last Heart Rate in normal range  ?  Pulse Readings from Last 1 Encounters:  ?01/30/22 89  ?   ?  ?  Passed - Valid encounter within last 6 months  ?  Recent Outpatient Visits   ?      ? 1 week ago Type 2 diabetes mellitus with stage 2 chronic kidney  disease, without long-term current use of insulin ([place])  ? [place], [place] P, DO  ? 2 weeks ago Viral upper respiratory tract infection  ? [facility] Vigg, Avanti, MD  ? 3 months ago Hypogonadism in male  ? [place], DO  ? 9 months ago Routine general medical examination at a health care facility  ? [place], Megan P, DO  ? 1 year ago Primary hypertension  ? [place], [place] P, DO  ?  ?  ?Future Appointments   ?        ? In 3 months Johnson, Megan P, DO [place]  ?  ? ?  ?  ?  ?? benazepril (LOTENSIN) 40 MG tablet [Pharmacy Med Name: BENAZEPRIL HCL 40 MG TAB] 90 tablet 1  ?  Sig: TAKE 1 TABLET BY MOUTH ONCE DAILY  ?  ? Cardiovascular:  ACE Inhibitors Failed - 02/10/2022  3:20  PM  ?  ?  Failed - Last BP in normal range  ?  BP Readings from Last 1 Encounters:  ?01/30/22 (!) 148/89  ?   ?  ?  Passed - Cr in normal range and within 180 days  ?  Creatinine  ?Date Value Ref Range Status  ?11/28/2012 0.98 0.60 - 1.30 mg/dL Final  ? ?Creatinine, Ser  ?Date Value Ref Range Status  ?11/01/2021 1.15 0.76 - 1.27 mg/dL Final  ?   ?  ?  Passed - K in normal range and within 180 days  ?  Potassium  ?Date Value Ref Range Status  ?11/01/2021 4.0 3.5 - 5.2 mmol/L Final  ?11/28/2012 3.9 3.5 - 5.1 mmol/L Final  ?   ?  ?  Passed - Patient is not pregnant  ?  ?  Passed - Valid encounter within last 6 months  ?  Recent Outpatient Visits   ?      ? 1 week ago Type 2 diabetes mellitus with stage 2 chronic kidney disease, without long-term current use of insulin ([place])  ? [place], [place] P, DO  ? 2 weeks ago Viral upper respiratory tract infection  ? [facility] Vigg, Avanti, MD  ? 3 months ago Hypogonadism in male  ? [place], DO  ? 9 months ago Routine general medical examination at a health care facility  ? [place], Megan P, DO  ? 1 year ago Primary hypertension  ? [place], [place] P, DO  ?  ?  ?Future Appointments   ?        ? In 3 months Johnson, Megan P, DO [facility]  ?  ? ?  ?  ?  ?? hydrochlorothiazide (HYDRODIURIL) 25 MG tablet [Pharmacy Med Name: HYDROCHLOROTHIAZIDE 25 MG TAB] 90 tablet 1  ?  Sig: TAKE 1 TABLET BY MOUTH ONCE DAILY  ?  ? Cardiovascular: Diuretics - Thiazide Failed - 02/10/2022  3:20 PM  ?  ?  Failed - Last BP in normal range  ?  BP Readings from Last 1 Encounters:  ?01/30/22 (!) 148/89  ?   ?  ?  Passed - Cr in normal range and within 180 days  ?  Creatinine  ?Date Value Ref Range Status  ?11/28/2012 0.98 0.60 - 1.30 mg/dL Final  ? ?Creatinine, Ser  ?Date Value Ref Range Status  ?11/01/2021 1.15 0.76 - 1.27 mg/dL Final  ?   ?  ?  Passed - K in normal range and within 180 days  ?  Potassium  ?Date Value Ref Range Status  ?11/01/2021 4.0 3.5 - 5.2 mmol/L Final  ?11/28/2012 3.9 3.5 - 5.1 mmol/L Final  ?   ?  ?  Passed - Na in normal range and within 180 days  ?  Sodium  ?Date Value Ref Range Status  ?11/01/2021 137 134 - 144 mmol/L Final  ?11/28/2012 137 136 - 145 mmol/L Final  ?   ?  ?  Passed - Valid encounter within last 6 months  ?  Recent Outpatient Visits   ?      ? 1 week ago Type 2 diabetes mellitus with stage 2 chronic kidney disease, without long-term current use of insulin ([place])  ? [place], [place] P, DO  ? 2 weeks ago Viral upper respiratory tract infection  ? [facility] Vigg, Avanti, MD  ? 3 months ago  Hypogonadism in male  ? [place], DO  ? 9 months ago Routine general medical examination at a health care facility  ? [place], Megan P, DO  ? 1 year ago Primary hypertension  ? [place], [place] P, DO  ?  ?  ?Future Appointments   ?        ? In 3 months Johnson, Megan P, DO [facility]  ?  ? ?  ?  ?  ?? venlafaxine  XR (EFFEXOR-XR) 150 MG 24 hr capsule [Pharmacy Med Name: VENLAFAXINE HCL ER 150 MG CAP] 90 capsule 1  ?  Sig: TAKE 1 CAPSULE BY MOUTH ONCE DAILY WITH BREAKFAST AS DIRECTED ALONG WITH '75MG'$  = '225MG'$   ?  ? Psychiatry: Antidepressants - SNRI - desvenlafaxine & venlafaxine Failed - 02/10/2022  3:20 PM  ?  ?  Failed - Last BP in normal range  ?  BP Readings from Last 1 Encounters:  ?01/30/22 (!) 148/89  ?   ?  ?  Failed - Lipid Panel in normal range within the last 12 months  ?  Cholesterol, Total  ?Date Value Ref Range Status  ?11/01/2021 252 (H) 100 - 199 mg/dL Final  ? ?Cholesterol Piccolo, [place]  ?Date Value Ref Range Status  ?05/29/2018 214 (H) <200 mg/dL Final  ?  Comment:  ?                          Desirable                <200 ?                        Borderline High      200- 239 ?                        High                     >239 ?  ? ?LDL Chol Calc (NIH)  ?Date Value Ref Range Status  ?11/01/2021 139 (H) 0 - 99 mg/dL Final  ? ?HDL  ?Date Value Ref Range Status  ?11/01/2021 59 >39 mg/dL Final  ? ?Triglycerides  ?Date Value Ref Range Status  ?11/01/2021 301 (H) 0 - 149 mg/dL Final  ? ?Triglycerides Piccolo,Waived  ?Date Value Ref Range Status  ?05/29/2018 253 (H) <150 mg/dL Final  ?  Comment:  ?                          Normal                   <150 ?                        Borderline High     150 - 199 ?                        High                200 - 499 ?                        Very High                >  499 ?  ? ?  ?  ?  Passed - Cr in normal range and within 360 days  ?  Creatinine  ?Date Value Ref Range Status  ?11/28/2012 0.98 0.60 - 1.30 mg/dL Final  ? ?Creatinine, Ser  ?Date Value Ref Range Status  ?11/01/2021 1.15 0.76 - 1.27 mg/dL Final  ?   ?  ?  Passed - Completed PHQ-2 or PHQ-9 in the last 360 days  ?  ?  Passed - Valid encounter within last 6 months  ?  Recent Outpatient Visits   ?      ? 1 week ago Type 2 diabetes mellitus with stage 2 chronic kidney disease, without long-term current use of  insulin ([place])  ? [place], [place] P, DO  ? 2 weeks ago Viral upper respiratory tract infection  ? [facility] Vigg, Avanti, MD  ? 3 months ago Hypogonadism in male  ? [place]

## 2022-03-12 LAB — HM DIABETES EYE EXAM

## 2022-05-10 ENCOUNTER — Encounter: Payer: Self-pay | Admitting: Family Medicine

## 2022-05-28 ENCOUNTER — Encounter: Payer: Managed Care, Other (non HMO) | Admitting: Family Medicine

## 2022-06-29 ENCOUNTER — Encounter: Payer: Self-pay | Admitting: Family Medicine

## 2022-06-29 ENCOUNTER — Ambulatory Visit (INDEPENDENT_AMBULATORY_CARE_PROVIDER_SITE_OTHER): Payer: Managed Care, Other (non HMO) | Admitting: Family Medicine

## 2022-06-29 VITALS — BP 127/78 | HR 79 | Temp 97.8°F | Ht 69.0 in | Wt 207.7 lb

## 2022-06-29 DIAGNOSIS — E291 Testicular hypofunction: Secondary | ICD-10-CM

## 2022-06-29 DIAGNOSIS — I10 Essential (primary) hypertension: Secondary | ICD-10-CM

## 2022-06-29 DIAGNOSIS — F321 Major depressive disorder, single episode, moderate: Secondary | ICD-10-CM

## 2022-06-29 DIAGNOSIS — E1122 Type 2 diabetes mellitus with diabetic chronic kidney disease: Secondary | ICD-10-CM

## 2022-06-29 DIAGNOSIS — R3911 Hesitancy of micturition: Secondary | ICD-10-CM

## 2022-06-29 DIAGNOSIS — Z0000 Encounter for general adult medical examination without abnormal findings: Secondary | ICD-10-CM

## 2022-06-29 DIAGNOSIS — N182 Chronic kidney disease, stage 2 (mild): Secondary | ICD-10-CM

## 2022-06-29 DIAGNOSIS — E7800 Pure hypercholesterolemia, unspecified: Secondary | ICD-10-CM

## 2022-06-29 DIAGNOSIS — N401 Enlarged prostate with lower urinary tract symptoms: Secondary | ICD-10-CM | POA: Diagnosis not present

## 2022-06-29 LAB — URINALYSIS, ROUTINE W REFLEX MICROSCOPIC
Bilirubin, UA: NEGATIVE
Glucose, UA: NEGATIVE
Leukocytes,UA: NEGATIVE
Nitrite, UA: NEGATIVE
RBC, UA: NEGATIVE
Specific Gravity, UA: 1.025 (ref 1.005–1.030)
Urobilinogen, Ur: 1 mg/dL (ref 0.2–1.0)
pH, UA: 6 (ref 5.0–7.5)

## 2022-06-29 LAB — MICROALBUMIN, URINE WAIVED
Creatinine, Urine Waived: 200 mg/dL (ref 10–300)
Microalb, Ur Waived: 30 mg/L — ABNORMAL HIGH (ref 0–19)
Microalb/Creat Ratio: <30 mg/g (ref <30)

## 2022-06-29 LAB — BAYER DCA HB A1C WAIVED: HB A1C (BAYER DCA - WAIVED): 6.6 % — ABNORMAL HIGH (ref 4.8–5.6)

## 2022-06-29 MED ORDER — VENLAFAXINE HCL ER 150 MG PO CP24: ORAL_CAPSULE | ORAL | 1 refills | Status: DC

## 2022-06-29 MED ORDER — AMLODIPINE BESYLATE 10 MG PO TABS: 10.0000 mg | ORAL_TABLET | Freq: Every day | ORAL | 1 refills | Status: DC

## 2022-06-29 MED ORDER — ALBUTEROL SULFATE HFA 108 (90 BASE) MCG/ACT IN AERS: 2.0000 | INHALATION_SPRAY | Freq: Four times a day (QID) | RESPIRATORY_TRACT | 12 refills | Status: AC | PRN

## 2022-06-29 MED ORDER — HYDROCHLOROTHIAZIDE 25 MG PO TABS: 25.0000 mg | ORAL_TABLET | Freq: Every day | ORAL | 1 refills | Status: DC

## 2022-06-29 MED ORDER — GABAPENTIN 100 MG PO CAPS
100.0000 mg | ORAL_CAPSULE | Freq: Every day | ORAL | 1 refills | Status: DC
Start: 2022-06-29 — End: 2023-03-11

## 2022-06-29 MED ORDER — VENLAFAXINE HCL ER 75 MG PO CP24: 75.0000 mg | ORAL_CAPSULE | Freq: Every day | ORAL | 1 refills | Status: DC

## 2022-06-29 MED ORDER — BENAZEPRIL HCL 40 MG PO TABS: 40.0000 mg | ORAL_TABLET | Freq: Every day | ORAL | 1 refills | Status: DC

## 2022-06-29 NOTE — Progress Notes (Signed)
BP 127/78   Pulse 79   Temp 97.8 F (36.6 C)   Ht 5' 9"  (1.753 m)   Wt 207 lb 11.2 oz (94.2 kg)   SpO2 98%   BMI 30.67 kg/m    Subjective:    Patient ID: Jesse Dunlap, male    DOB: 1966-09-07, 56 y.o.   MRN: [id number]  HPI: Jesse Dunlap is a 56 y.o. male presenting on 06/29/2022 for comprehensive medical examination. Current medical complaints include:  DIABETES Hypoglycemic episodes:no Polydipsia/polyuria: no Visual disturbance: no Chest pain: no Paresthesias: no Glucose Monitoring: no Taking Insulin?: no Blood Pressure Monitoring: not checking Retinal Examination: Not up to Date Foot Exam: Up to Date Diabetic Education: Completed Pneumovax: Up to Date Influenza: Up to Date Aspirin: yes  HYPERTENSION / HYPERLIPIDEMIA Satisfied with current treatment? yes Duration of hypertension: chronic BP monitoring frequency: not checking BP medication side effects: no Past BP meds: benazepril, amlodipine, HCTZ Duration of hyperlipidemia: chronic Cholesterol medication side effects: no Cholesterol supplements: none Past cholesterol medications: none Medication compliance: excellent compliance Aspirin: yes Recent stressors: no Recurrent headaches: no Visual changes: no Palpitations: no Dyspnea: no Chest pain: no Lower extremity edema: no Dizzy/lightheaded: no  LOW TESTOSTERONE Duration: chronic Status: controlled  Satisfied with current treatment:  yes Previous testosterone therapies: androgel Medication side effects:  no Medication compliance: excellent compliance Decreased libido: no Fatigue: no Depressed mood: no Muscle weakness: no Erectile dysfunction: no  DEPRESSION Mood status: controlled Satisfied with current treatment?: yes Symptom severity: mild  Duration of current treatment : chronic Side effects: no Medication compliance: excellent compliance Psychotherapy/counseling: no  Previous psychiatric medications: effexor Depressed mood:  no Anxious mood: no Anhedonia: no Significant weight loss or gain: no Insomnia: no  Fatigue: no Feelings of worthlessness or guilt: no Impaired concentration/indecisiveness: no Suicidal ideations: no Hopelessness: no Crying spells: no    06/29/2022   10:48 AM 01/30/2022    8:50 AM 11/01/2021    9:17 AM 05/04/2021    8:13 AM 12/27/2020    8:24 AM  Depression screen PHQ 2/9  Decreased Interest 1 1 2  0 1  Down, Depressed, Hopeless 1 1 1  0 1  PHQ - 2 Score 2 2 3  0 2  Altered sleeping 0 1 0 0 1  Tired, decreased energy 0 0 0 1 2  Change in appetite 0 1 1 1 1   Feeling bad or failure about yourself  0 1 1 1 1   Trouble concentrating 0 0 1 0 0  Moving slowly or fidgety/restless 0 0 1 0 0  Suicidal thoughts 0 0 0 0 0  PHQ-9 Score 2 5 7 3 7   Difficult doing work/chores Not difficult at all   Not difficult at all Somewhat difficult    Interim Problems from his last visit: no  Depression Screen done today and results listed below:     06/29/2022   10:48 AM 01/30/2022    8:50 AM 11/01/2021    9:17 AM 05/04/2021    8:13 AM 12/27/2020    8:24 AM  Depression screen PHQ 2/9  Decreased Interest 1 1 2  0 1  Down, Depressed, Hopeless 1 1 1  0 1  PHQ - 2 Score 2 2 3  0 2  Altered sleeping 0 1 0 0 1  Tired, decreased energy 0 0 0 1 2  Change in appetite 0 1 1 1 1   Feeling bad or failure about yourself  0 1 1 1 1   Trouble concentrating 0 0 1 0 0  Moving slowly or fidgety/restless 0 0 1 0 0  Suicidal thoughts 0 0 0 0 0  PHQ-9 Score 2 5 7 3 7   Difficult doing work/chores Not difficult at all   Not difficult at all Somewhat difficult    Past Medical History:  Past Medical History:  Diagnosis Date   Attention deficit disorder    Diabetes mellitus without complication ([place])    prior to gastric sleeve and wt loss   Family history of breast cancer    Hyperlipidemia    Hypertension    prior to gastric sleeve and wt loss   Hypogonadism in male    Obesity (BMI 35.0-39.9 without comorbidity)     OSA on CPAP     Surgical History:  Past Surgical History:  Procedure Laterality Date   CARDIAC CATHETERIZATION  approx 2000   COLONOSCOPY  approx 2005   had polyp   COLONOSCOPY WITH PROPOFOL N/A 03/16/2016   Procedure: COLONOSCOPY WITH PROPOFOL;  Surgeon: Lucilla Lame, MD;  Location: [facility];  Service: Endoscopy;  Laterality: N/A;  CPAP   COLONOSCOPY WITH PROPOFOL N/A 02/29/2020   Procedure: COLONOSCOPY WITH BIOPSYr;  Surgeon: Lucilla Lame, MD;  Location: [facility];  Service: Endoscopy;  Laterality: N/A;  sleep apnea Priority 3   LAPAROSCOPIC GASTRIC SLEEVE RESECTION  04/2013   POLYPECTOMY  03/16/2016   Procedure: POLYPECTOMY;  Surgeon: Lucilla Lame, MD;  Location: [facility];  Service: Endoscopy;;   POLYPECTOMY N/A 02/29/2020   Procedure: POLYPECTOMY;  Surgeon: Lucilla Lame, MD;  Location: [facility];  Service: Endoscopy;  Laterality: N/A;   RHINOPLASTY      Medications:  Current Outpatient Medications on File Prior to Visit  Medication Sig   aspirin EC 81 MG tablet Take 81 mg by mouth daily.   fexofenadine (ALLEGRA ALLERGY) 180 MG tablet Take 1 tablet (180 mg total) by mouth daily.   testosterone (ANDROGEL) 50 MG/5GM (1%) GEL APPLY 1 PACKET ONTO THE SKIN ONCE DAILY   No current facility-administered medications on file prior to visit.    Allergies:  Allergies  Allergen Reactions   Tramadol Itching    Social History:  Social History   Socioeconomic History   Marital status: Married    Spouse name: Not on file   Number of children: Not on file   Years of education: Not on file   Highest education level: Not on file  Occupational History   Not on file  Tobacco Use   Smoking status: Former    Packs/day: 1.50    Years: 10.00    Total pack years: 15.00    Types: Cigarettes    Quit date: 11/05/1986    Years since quitting: 35.6   Smokeless tobacco: Never  Vaping Use   Vaping Use: Never used  Substance and Sexual Activity    Alcohol use: Yes    Alcohol/week: 4.0 - 5.0 standard drinks of alcohol    Types: 4 - 5 Glasses of wine per week    Comment:     Drug use: No   Sexual activity: Not Currently  Other Topics Concern   Not on file  Social History Narrative   Not on file   Social Determinants of Health   Financial Resource Strain: Not on file  Food Insecurity: Not on file  Transportation Needs: Not on file  Physical Activity: Not on file  Stress: Not on file  Social Connections: Not on file  Intimate Partner Violence: Not on file   Social  History   Tobacco Use  Smoking Status Former   Packs/day: 1.50   Years: 10.00   Total pack years: 15.00   Types: Cigarettes   Quit date: 11/05/1986   Years since quitting: 35.6  Smokeless Tobacco Never   Social History   Substance and Sexual Activity  Alcohol Use Yes   Alcohol/week: 4.0 - 5.0 standard drinks of alcohol   Types: 4 - 5 Glasses of wine per week   Comment:      Family History:  Family History  Problem Relation Age of Onset   Diabetes Mother    Breast cancer Mother 79       CHEK2 pos   Atrial fibrillation Mother    Thyroid disease Mother    Diabetes Father    Hypertension Father    Skin cancer Father    Breast cancer Sister 29   Alzheimer's disease Maternal Grandmother    Breast cancer Maternal Grandmother        dx over 54   Prostate cancer Maternal Grandfather        dx in his 104s-70s   Lung cancer Paternal Grandfather    Prostate cancer Other        PGF's father    Past medical history, surgical history, medications, allergies, family history and social history reviewed with patient today and changes made to appropriate areas of the chart.   Review of Systems  Constitutional: Negative.   HENT:  Positive for hearing loss. Negative for congestion, ear discharge, ear pain, nosebleeds, sinus pain, sore throat and tinnitus.   Eyes: Negative.   Respiratory: Negative.  Negative for stridor.   Cardiovascular: Negative.    Gastrointestinal: Negative.   Genitourinary: Negative.   Musculoskeletal: Negative.   Skin: Negative.   Neurological: Negative.   Endo/Heme/Allergies: Negative.   Psychiatric/Behavioral: Negative.     All other ROS negative except what is listed above and in the HPI.      Objective:    BP 127/78   Pulse 79   Temp 97.8 F (36.6 C)   Ht 5' 9"  (1.753 m)   Wt 207 lb 11.2 oz (94.2 kg)   SpO2 98%   BMI 30.67 kg/m   Wt Readings from Last 3 Encounters:  06/29/22 207 lb 11.2 oz (94.2 kg)  01/30/22 216 lb (98 kg)  11/01/21 212 lb (96.2 kg)    Physical Exam Vitals and nursing note reviewed.  Constitutional:      General: He is not in acute distress.    Appearance: Normal appearance. He is normal weight. He is not ill-appearing, toxic-appearing or diaphoretic.  HENT:     Head: Normocephalic and atraumatic.     Right Ear: Tympanic membrane, ear canal and external ear normal. There is no impacted cerumen.     Left Ear: Tympanic membrane, ear canal and external ear normal. There is no impacted cerumen.     Nose: Nose normal. No congestion or rhinorrhea.     Mouth/Throat:     Mouth: Mucous membranes are moist.     Pharynx: Oropharynx is clear. No oropharyngeal exudate or posterior oropharyngeal erythema.  Eyes:     General: No scleral icterus.       Right eye: No discharge.        Left eye: No discharge.     Extraocular Movements: Extraocular movements intact.     Conjunctiva/sclera: Conjunctivae normal.     Pupils: Pupils are equal, round, and reactive to light.  Neck:  Vascular: No carotid bruit.  Cardiovascular:     Rate and Rhythm: Normal rate and regular rhythm.     Pulses: Normal pulses.     Heart sounds: No murmur heard.    No friction rub. No gallop.  Pulmonary:     Effort: Pulmonary effort is normal. No respiratory distress.     Breath sounds: Normal breath sounds. No stridor. No wheezing, rhonchi or rales.  Chest:     Chest wall: No tenderness.  Abdominal:      General: Abdomen is flat. Bowel sounds are normal. There is no distension.     Palpations: Abdomen is soft. There is no mass.     Tenderness: There is no abdominal tenderness. There is no right CVA tenderness, left CVA tenderness, guarding or rebound.     Hernia: No hernia is present.  Genitourinary:    Comments: Genital exam deferred with shared decision making Musculoskeletal:        General: No swelling, tenderness, deformity or signs of injury.     Cervical back: Normal range of motion and neck supple. No rigidity. No muscular tenderness.     Right lower leg: No edema.     Left lower leg: No edema.  Lymphadenopathy:     Cervical: No cervical adenopathy.  Skin:    General: Skin is warm and dry.     Capillary Refill: Capillary refill takes less than 2 seconds.     Coloration: Skin is not jaundiced or pale.     Findings: No bruising, erythema, lesion or rash.  Neurological:     General: No focal deficit present.     Mental Status: He is alert and oriented to person, place, and time.     Cranial Nerves: No cranial nerve deficit.     Sensory: No sensory deficit.     Motor: No weakness.     Coordination: Coordination normal.     Gait: Gait normal.     Deep Tendon Reflexes: Reflexes normal.  Psychiatric:        Mood and Affect: Mood normal.        Behavior: Behavior normal.        Thought Content: Thought content normal.        Judgment: Judgment normal.     Results for orders placed or performed in visit on 01/30/22  Bayer DCA Hb A1c Waived  Result Value Ref Range   HB A1C (BAYER DCA - WAIVED) 6.7 (H) 4.8 - 5.6 %      Assessment & Plan:   Problem List Items Addressed This Visit       Cardiovascular and Mediastinum   Hypertension    Under good control on current regimen. Continue current regimen. Continue to monitor. Call with any concerns. Refills given. Labs drawn today.       Relevant Medications   hydrochlorothiazide (HYDRODIURIL) 25 MG tablet   benazepril  (LOTENSIN) 40 MG tablet   amLODipine (NORVASC) 10 MG tablet   Other Relevant Orders   Comprehensive metabolic panel   Microalbumin, Urine Waived     Endocrine   Type 2 diabetes mellitus with stage 2 chronic kidney disease, without long-term current use of insulin (HCC)    Doing great with a1c of 6.6. Continue diet and exercise. Call with any concerns.       Relevant Medications   benazepril (LOTENSIN) 40 MG tablet   Other Relevant Orders   Comprehensive metabolic panel   Microalbumin, Urine Waived   Bayer DCA Hb A1c  Waived   Hypogonadism in male    Under good control on current regimen. Continue current regimen. Continue to monitor. Call with any concerns. Refills given. Labs drawn today.       Relevant Orders   Comprehensive metabolic panel   Testosterone, free, total([facility])     Genitourinary   Benign prostatic hyperplasia with urinary hesitancy    Rechecking labs today. Await results.          Other   Depression, major, single episode, moderate ([place])    Under good control on current regimen. Continue current regimen. Continue to monitor. Call with any concerns. Refills given. Labs drawn today.       Relevant Medications   venlafaxine XR (EFFEXOR-XR) 75 MG 24 hr capsule   venlafaxine XR (EFFEXOR-XR) 150 MG 24 hr capsule   Hypercholesteremia    Under good control on current regimen. Continue current regimen. Continue to monitor. Call with any concerns. Refills given. Labs drawn today.       Relevant Medications   hydrochlorothiazide (HYDRODIURIL) 25 MG tablet   benazepril (LOTENSIN) 40 MG tablet   amLODipine (NORVASC) 10 MG tablet   Other Visit Diagnoses     Routine general medical examination at a health care facility    -  Primary   Vaccines up to date. Screening labs checked today. Colonscopy up to date. Continue diet and exercise. Call with any concerns.    Relevant Orders   Comprehensive metabolic panel   CBC with Differential/Platelet    Lipid Panel w/o Chol/HDL Ratio   PSA   TSH   Urinalysis, Routine w reflex microscopic   Microalbumin, Urine Waived   Bayer DCA Hb A1c Waived   Essential hypertension       Relevant Medications   hydrochlorothiazide (HYDRODIURIL) 25 MG tablet   benazepril (LOTENSIN) 40 MG tablet   amLODipine (NORVASC) 10 MG tablet        Discussed aspirin prophylaxis for myocardial infarction prevention and decision was made to continue ASA  LABORATORY TESTING:  Health maintenance labs ordered today as discussed above.   The natural history of prostate cancer and ongoing controversy regarding screening and potential treatment outcomes of prostate cancer has been discussed with the patient. The meaning of a false positive PSA and a false negative PSA has been discussed. He indicates understanding of the limitations of this screening test and wishes to proceed with screening PSA testing.   IMMUNIZATIONS:   - Tdap: Tetanus vaccination status reviewed: last tetanus booster within 10 years. - Influenza: Postponed to flu season - Pneumovax: Up to date - Prevnar: Not applicable - COVID: Up to date - HPV: Not applicable - Shingrix vaccine: Up to date  SCREENING: - Colonoscopy: Up to date  Discussed with patient purpose of the colonoscopy is to detect colon cancer at curable precancerous or early stages   PATIENT COUNSELING:    Sexuality: Discussed sexually transmitted diseases, partner selection, use of condoms, avoidance of unintended pregnancy  and contraceptive alternatives.   Advised to avoid cigarette smoking.  I discussed with the patient that most people either abstain from alcohol or drink within safe limits (<=14/week and <=4 drinks/occasion for males, <=7/weeks and <= 3 drinks/occasion for females) and that the risk for alcohol disorders and other health effects rises proportionally with the number of drinks per week and how often a drinker exceeds daily limits.  Discussed  cessation/primary prevention of drug use and availability of treatment for abuse.   Diet: Encouraged to adjust caloric intake  to maintain  or achieve ideal body weight, to reduce intake of dietary saturated fat and total fat, to limit sodium intake by avoiding high sodium foods and not adding table salt, and to maintain adequate dietary potassium and calcium preferably from fresh fruits, vegetables, and low-fat dairy products.    stressed the importance of regular exercise  Injury prevention: Discussed safety belts, safety helmets, smoke detector, smoking near bedding or upholstery.   Dental health: Discussed importance of regular tooth brushing, flossing, and dental visits.   Follow up plan: NEXT PREVENTATIVE PHYSICAL DUE IN 1 YEAR. Return in about 6 months (around 12/30/2022).

## 2022-06-29 NOTE — Assessment & Plan Note (Signed)
Under good control on current regimen. Continue current regimen. Continue to monitor. Call with any concerns. Refills given. Labs drawn today.   

## 2022-06-29 NOTE — Assessment & Plan Note (Signed)
Doing great with a1c of 6.6. Continue diet and exercise. Call with any concerns.

## 2022-06-29 NOTE — Assessment & Plan Note (Signed)
Rechecking labs today. Await results.  

## 2022-07-07 LAB — COMPREHENSIVE METABOLIC PANEL
ALT: 50 IU/L — ABNORMAL HIGH (ref 0–44)
AST: 43 IU/L — ABNORMAL HIGH (ref 0–40)
Albumin/Globulin Ratio: 1.8 (ref 1.2–2.2)
Albumin: 4.4 g/dL (ref 3.8–4.9)
Alkaline Phosphatase: 75 IU/L (ref 44–121)
BUN/Creatinine Ratio: 14 (ref 9–20)
BUN: 14 mg/dL (ref 6–24)
Bilirubin Total: 0.6 mg/dL (ref 0.0–1.2)
CO2: 24 mmol/L (ref 20–29)
Calcium: 9.5 mg/dL (ref 8.7–10.2)
Chloride: 101 mmol/L (ref 96–106)
Creatinine, Ser: 1 mg/dL (ref 0.76–1.27)
Globulin, Total: 2.5 g/dL (ref 1.5–4.5)
Glucose: 179 mg/dL — ABNORMAL HIGH (ref 70–99)
Potassium: 4.1 mmol/L (ref 3.5–5.2)
Sodium: 141 mmol/L (ref 134–144)
Total Protein: 6.9 g/dL (ref 6.0–8.5)
eGFR: 88 mL/min/{1.73_m2} (ref >59)

## 2022-07-07 LAB — CBC WITH DIFFERENTIAL/PLATELET
Basophils Absolute: 0 10*3/uL (ref 0.0–0.2)
Basos: 0 %
EOS (ABSOLUTE): 0.1 10*3/uL (ref 0.0–0.4)
Eos: 3 %
Hematocrit: 47.2 % (ref 37.5–51.0)
Hemoglobin: 15.8 g/dL (ref 13.0–17.7)
Immature Grans (Abs): 0 10*3/uL (ref 0.0–0.1)
Immature Granulocytes: 0 %
Lymphocytes Absolute: 1.3 10*3/uL (ref 0.7–3.1)
Lymphs: 27 %
MCH: 32.4 pg (ref 26.6–33.0)
MCHC: 33.5 g/dL (ref 31.5–35.7)
MCV: 97 fL (ref 79–97)
Monocytes Absolute: 0.5 10*3/uL (ref 0.1–0.9)
Monocytes: 10 %
Neutrophils Absolute: 2.8 10*3/uL (ref 1.4–7.0)
Neutrophils: 60 %
Platelets: 204 10*3/uL (ref 150–450)
RBC: 4.87 x10E6/uL (ref 4.14–5.80)
RDW: 12.7 % (ref 11.6–15.4)
WBC: 4.7 10*3/uL (ref 3.4–10.8)

## 2022-07-07 LAB — PSA: Prostate Specific Ag, Serum: 5.2 ng/mL — ABNORMAL HIGH (ref 0.0–4.0)

## 2022-07-07 LAB — TESTOSTERONE, FREE, TOTAL, SHBG
Sex Hormone Binding: 19.9 nmol/L (ref 19.3–76.4)
Testosterone, Free: 6.8 pg/mL — ABNORMAL LOW (ref 7.2–24.0)
Testosterone: 213 ng/dL — ABNORMAL LOW (ref 264–916)

## 2022-07-07 LAB — LIPID PANEL W/O CHOL/HDL RATIO
Cholesterol, Total: 224 mg/dL — ABNORMAL HIGH (ref 100–199)
HDL: 50 mg/dL (ref >39)
LDL Chol Calc (NIH): 121 mg/dL — ABNORMAL HIGH (ref 0–99)
Triglycerides: 303 mg/dL — ABNORMAL HIGH (ref 0–149)
VLDL Cholesterol Cal: 53 mg/dL — ABNORMAL HIGH (ref 5–40)

## 2022-07-07 LAB — TSH: TSH: 1.03 u[IU]/mL (ref 0.450–4.500)

## 2022-07-09 ENCOUNTER — Other Ambulatory Visit: Payer: Self-pay | Admitting: Family Medicine

## 2022-07-09 DIAGNOSIS — R972 Elevated prostate specific antigen [PSA]: Secondary | ICD-10-CM

## 2022-07-23 ENCOUNTER — Other Ambulatory Visit: Payer: Self-pay | Admitting: Family Medicine

## 2022-07-23 NOTE — Telephone Encounter (Signed)
Requested medication (s) are due for refill today: yes  Requested medication (s) are on the active medication list: yes  Last refill:  01/02/21 150 g with 5 RF  Future visit scheduled: 12/31/22, seen 06/29/22, lab 06/29/22  Notes to clinic:  There is not a protocol for this med, please assess.      Requested Prescriptions  Pending Prescriptions Disp Refills   testosterone (ANDROGEL) 50 MG/5GM (1%) GEL [Pharmacy Med Name: TESTOSTERONE 50 MG/5 GRAM GEL] 150 g     Sig: APPLY 1 TUBE ON SKIN ONCE A DAY     Off-Protocol Failed - 07/23/2022  2:16 AM      Failed - Medication not assigned to a protocol, review manually.      Passed - Valid encounter within last 12 months    Recent Outpatient Visits           3 weeks ago Routine general medical examination at a health care facility   [facility] [place] P, DO   5 months ago Type 2 diabetes mellitus with stage 2 chronic kidney disease, without long-term current use of insulin ([place])   [place], Megan P, DO   6 months ago Viral upper respiratory tract infection   [facility] V[place], Avanti, MD   8 months ago Hypogonadism in male   [place], [place], DO   1 year ago Routine general medical examination at a health care facility   [place], Barb Merino, DO       Future Appointments             In 5 months Wynetta Emery, Barb Merino, DO [employer]

## 2022-08-08 ENCOUNTER — Encounter: Payer: Self-pay | Admitting: Family Medicine

## 2022-08-08 ENCOUNTER — Ambulatory Visit: Payer: Self-pay | Admitting: *Deleted

## 2022-08-08 ENCOUNTER — Encounter: Payer: Self-pay | Admitting: Nurse Practitioner

## 2022-08-08 ENCOUNTER — Ambulatory Visit: Payer: Managed Care, Other (non HMO) | Admitting: Nurse Practitioner

## 2022-08-08 VITALS — BP 130/92 | HR 108 | Temp 99.3°F | Wt 200.0 lb

## 2022-08-08 DIAGNOSIS — R197 Diarrhea, unspecified: Secondary | ICD-10-CM | POA: Insufficient documentation

## 2022-08-08 DIAGNOSIS — R972 Elevated prostate specific antigen [PSA]: Secondary | ICD-10-CM | POA: Insufficient documentation

## 2022-08-08 NOTE — Assessment & Plan Note (Signed)
Pt originally scheduled for PSA levels tomorrow, 10/5 but labs drawn today during acute/episodic visit.  Will f/u with patient on results of labs once completed.

## 2022-08-08 NOTE — Telephone Encounter (Signed)
  Chief Complaint: diarrhea for the last couple of days that Imodium is not touching Symptoms: watery diarrhea, achy all over, chills, night sweats, headache, bloated abd. Frequency: For last couple of days Pertinent Negatives: Patient denies blood in stool or abd pain.   No exposures to anyone sick. Disposition: '[]'$ ED /'[]'$ Urgent Care (no appt availability in office) / '[x]'$ Appointment(In office/virtual)/ '[]'$  [facility]/ '[]'$ Home Care/ '[]'$ Refused Recommended Disposition /'[]'$ [facility]/ '[]'$  Follow-up with PCP Additional Notes: Scheduled with Jon Billings, NP for this morning at 9:20.

## 2022-08-08 NOTE — Telephone Encounter (Signed)
Reason for Disposition  [1] MODERATE diarrhea (e.g., 4-6 times / day more than normal) AND [2] present > 48 hours (2 days)  Answer Assessment - Initial Assessment Questions 1. DIARRHEA SEVERITY: "How bad is the diarrhea?" "How many more stools have you had in the past 24 hours than normal?"    - NO DIARRHEA (SCALE 0)   - MILD (SCALE 1-3): Few loose or mushy BMs; increase of 1-3 stools over normal daily number of stools; mild increase in ostomy output.   -  MODERATE (SCALE 4-7): Increase of 4-6 stools daily over normal; moderate increase in ostomy output.   -  SEVERE (SCALE 8-10; OR "WORST POSSIBLE"): Increase of 7 or more stools daily over normal; moderate increase in ostomy output; incontience.     Yesterday I took 9 Imodium AD and it did not touch the diarrhea.    Headache, chills.    I accidentally poisoned myself yesterday.   I mixed toilet cleaner with bleach.   My face turned beet red.   My headache occurred right after that.   I just got a couple breath fulls while flushing it down.  2. ONSET: "When did the diarrhea begin?"      Diarrhea started a couple days ago.   Headache, achy all over, 100.5 yesterday.    Last night I had night sweats and chills back and forth.   3. BM CONSISTENCY: "How loose or watery is the diarrhea?"      Very watery.    I'm drinking a lot of water.   I've not been [place] so eating very little. 4. VOMITING: "Are you also vomiting?" If Yes, ask: "How many times in the past 24 hours?"      No    I had nausea a couple of times but it passed quickly. 5. ABDOMEN PAIN: "Are you having any abdomen pain?" If Yes, ask: "What does it feel like?" (e.g., crampy, dull, intermittent, constant)      Bloated feeling but no pain 6. ABDOMEN PAIN SEVERITY: If present, ask: "How bad is the pain?"  (e.g., Scale 1-10; mild, moderate, or severe)   - MILD (1-3): doesn't interfere with normal activities, abdomen soft and not tender to touch    - MODERATE (4-7): interferes with normal  activities or awakens from sleep, abdomen tender to touch    - SEVERE (8-10): excruciating pain, doubled over, unable to do any normal activities       No pain 7. ORAL INTAKE: If vomiting, "Have you been able to drink liquids?" "How much liquids have you had in the past 24 hours?"     Drink plenty of water 8. HYDRATION: "Any signs of dehydration?" (e.g., dry mouth [not just dry lips], too weak to stand, dizziness, new weight loss) "When did you last urinate?"     No signs   9. EXPOSURE: "Have you traveled to a foreign country recently?" "Have you been exposed to anyone with diarrhea?" "Could you have eaten any food that was spoiled?"     No 10. ANTIBIOTIC USE: "Are you taking antibiotics now or have you taken antibiotics in the past 2 months?"       No 11. OTHER SYMPTOMS: "Do you have any other symptoms?" (e.g., fever, blood in stool)       No blood 12. PREGNANCY: "Is there any chance you are pregnant?" "When was your last menstrual period?"       N/A  Protocols used: [facility]

## 2022-08-08 NOTE — Assessment & Plan Note (Signed)
Based on history and exam, and r/o of acute abdominal issues, appears pt is experiencing viral gastroenteritis (GI bug).  Recommend bland diet, maintain hydration, and increase intake of solids as tolerated.  Due to hx of IBS, pt advised to contact clinic Friday if symptoms persist in intensity or worsen.  Will suggest CT of the abdomen for further diagnosis.

## 2022-08-08 NOTE — Progress Notes (Signed)
BP (!) 130/92   Pulse (!) 108   Temp 99.3 F (37.4 C) (Oral)   Wt 200 lb (90.7 kg)   SpO2 96%   BMI 29.53 kg/m    Subjective:    Patient ID: Jesse Dunlap, male    DOB: 1966/04/29, 56 y.o.   MRN: [id number]  HPI: Jesse Dunlap is a 56 y.o. male  NOTE WRITTEN BY DNP STUDENT.  ASSESSMENT AND PLAN OF CARE REVIEWED WITH STUDENT, AGREE WITH ABOVE FINDINGS AND PLAN.   Chief Complaint  Patient presents with   Diarrhea     X 2-3 days. Headaches. Pt reports emesis this morning. Pt also reports bloating.    C/o diarrhea for 3 days. Has only tolerated 4 slices of toast 2 days.  Denies recent travel.  No changes to diet prior to symptoms. Last meal was coconut shrimp, tolerated well in the past. Prior to this event, normal BMs were Q3 hrs, more mucous. Has been drinking water, but felt nauseous when taking morning meds and water without anything on his stomach. Denies recent contact with any persons with known or suspected GI issues. Was off work when symptoms started. Pt has not seen GI.   Pt also wants to see if he can have his PSA levels drawn today instead of tomorrow.   ABDOMINAL ISSUES Duration: 3 days Nature: bloating and dull Location: diffuse  Severity: mild  Radiation: no Episode duration: Frequency: constant Alleviating factors: Aggravating factors: Treatments attempted:  Immodium, 9 total in a day, none taken today since no relief Constipation: no; not an issue previously Diarrhea: yes Episodes of diarrhea/day: throughout the day; not an emergent rush to bathroom Mucous in the stool: no; possibly day or two ago Heartburn: yes Bloating:yes Flatulence: no Nausea: yes Vomiting: yes; 1 episode before arriving to clinic Episodes of vomit/day: once Melena or hematochezia: no Rash: no Jaundice: no Fever: yes; 100.5 yesterday, took Ibuprofen yesterday for HA Weight loss: yes; feeling generalized weakness  Relevant past medical, surgical, family and social history  reviewed and updated as indicated. Interim medical history since our last visit reviewed. Allergies and medications reviewed and updated.  Review of Systems  Constitutional:  Positive for activity change, appetite change, chills, fatigue and fever.  Respiratory:  Negative for cough, choking, chest tightness and shortness of breath.   Cardiovascular:  Negative for chest pain, palpitations and leg swelling.  Gastrointestinal:  Positive for diarrhea, nausea and vomiting. Negative for abdominal distention, abdominal pain, blood in stool and constipation.  Allergic/Immunologic: Negative for environmental allergies and food allergies.  Neurological:  Positive for headaches. Negative for dizziness.    Per HPI unless specifically indicated above     Objective:    BP (!) 130/92   Pulse (!) 108   Temp 99.3 F (37.4 C) (Oral)   Wt 200 lb (90.7 kg)   SpO2 96%   BMI 29.53 kg/m   Wt Readings from Last 3 Encounters:  08/08/22 200 lb (90.7 kg)  06/29/22 207 lb 11.2 oz (94.2 kg)  01/30/22 216 lb (98 kg)    Physical Exam Constitutional:      Appearance: Normal appearance. He is normal weight. He is not ill-appearing.  Cardiovascular:     Rate and Rhythm: Normal rate and regular rhythm.     Pulses: Normal pulses.     Heart sounds: Normal heart sounds.  Pulmonary:     Effort: Pulmonary effort is normal. No respiratory distress.     Breath sounds: Normal breath sounds. No  wheezing or rhonchi.  Chest:     Chest wall: No tenderness.  Abdominal:     General: Bowel sounds are increased.     Palpations: Abdomen is soft.     Tenderness: There is no abdominal tenderness. There is no guarding or rebound. Negative signs include Murphy's sign and McBurney's sign.  Skin:    General: Skin is warm and dry.     Capillary Refill: Capillary refill takes less than 2 seconds.  Neurological:     General: No focal deficit present.     Mental Status: He is alert and oriented to person, place, and time.  Mental status is at baseline.  Psychiatric:        Mood and Affect: Mood normal.        Behavior: Behavior normal.        Thought Content: Thought content normal.        Judgment: Judgment normal.    Results for orders placed or performed in visit on 06/29/22  Comprehensive metabolic panel  Result Value Ref Range   Glucose 179 (H) 70 - 99 mg/dL   BUN 14 6 - 24 mg/dL   Creatinine, Ser 1.00 0.76 - 1.27 mg/dL   eGFR 88 >59 mL/min/1.73   BUN/Creatinine Ratio 14 9 - 20   Sodium 141 134 - 144 mmol/L   Potassium 4.1 3.5 - 5.2 mmol/L   Chloride 101 96 - 106 mmol/L   CO2 24 20 - 29 mmol/L   Calcium 9.5 8.7 - 10.2 mg/dL   Total Protein 6.9 6.0 - 8.5 g/dL   Albumin 4.4 3.8 - 4.9 g/dL   Globulin, Total 2.5 1.5 - 4.5 g/dL   Albumin/Globulin Ratio 1.8 1.2 - 2.2   Bilirubin Total 0.6 0.0 - 1.2 mg/dL   Alkaline Phosphatase 75 44 - 121 IU/L   AST 43 (H) 0 - 40 IU/L   ALT 50 (H) 0 - 44 IU/L  CBC with Differential/Platelet  Result Value Ref Range   WBC 4.7 3.4 - 10.8 x10E3/uL   RBC 4.87 4.14 - 5.80 x10E6/uL   Hemoglobin 15.8 13.0 - 17.7 g/dL   Hematocrit 47.2 37.5 - 51.0 %   MCV 97 79 - 97 fL   MCH 32.4 26.6 - 33.0 pg   MCHC 33.5 31.5 - 35.7 g/dL   RDW 12.7 11.6 - 15.4 %   Platelets 204 150 - 450 x10E3/uL   Neutrophils 60 Not Estab. %   Lymphs 27 Not Estab. %   Monocytes 10 Not Estab. %   Eos 3 Not Estab. %   Basos 0 Not Estab. %   Neutrophils Absolute 2.8 1.4 - 7.0 x10E3/uL   Lymphocytes Absolute 1.3 0.7 - 3.1 x10E3/uL   Monocytes Absolute 0.5 0.1 - 0.9 x10E3/uL   EOS (ABSOLUTE) 0.1 0.0 - 0.4 x10E3/uL   Basophils Absolute 0.0 0.0 - 0.2 x10E3/uL   Immature Granulocytes 0 Not Estab. %   Immature Grans (Abs) 0.0 0.0 - 0.1 x10E3/uL  Lipid Panel w/o Chol/HDL Ratio  Result Value Ref Range   Cholesterol, Total 224 (H) 100 - 199 mg/dL   Triglycerides 303 (H) 0 - 149 mg/dL   HDL 50 >39 mg/dL   VLDL Cholesterol Cal 53 (H) 5 - 40 mg/dL   LDL Chol Calc (NIH) 121 (H) 0 - 99 mg/dL  PSA   Result Value Ref Range   Prostate Specific Ag, Serum 5.2 (H) 0.0 - 4.0 ng/mL  TSH  Result Value Ref Range   TSH 1.030  0.450 - 4.500 uIU/mL  Urinalysis, Routine w reflex microscopic  Result Value Ref Range   Specific Gravity, UA 1.025 1.005 - 1.030   pH, UA 6.0 5.0 - 7.5   Color, UA Yellow Yellow   Appearance Ur Clear Clear   Leukocytes,UA Negative Negative   Protein,UA Trace (A) Negative/Trace   Glucose, UA Negative Negative   Ketones, UA Trace (A) Negative   RBC, UA Negative Negative   Bilirubin, UA Negative Negative   Urobilinogen, Ur 1.0 0.2 - 1.0 mg/dL   Nitrite, UA Negative Negative  Microalbumin, Urine Waived  Result Value Ref Range   Microalb, Ur Waived 30 (H) 0 - 19 mg/L   Creatinine, Urine Waived 200 10 - 300 mg/dL   Microalb/Creat Ratio <30 <30 mg/g  Bayer DCA Hb A1c Waived  Result Value Ref Range   HB A1C (BAYER DCA - WAIVED) 6.6 (H) 4.8 - 5.6 %  Testosterone, free, total([facility])  Result Value Ref Range   Testosterone 213 (L) 264 - 916 ng/dL   Testosterone, Free 6.8 (L) 7.2 - 24.0 pg/mL   Sex Hormone Binding 19.9 19.3 - 76.4 nmol/L      Assessment & Plan:   Problem List Items Addressed This Visit       Other   Diarrhea - Primary    Based on history and exam, and r/o of acute abdominal issues, appears pt is experiencing viral gastroenteritis (GI bug).  Recommend bland diet, maintain hydration, and increase intake of solids as tolerated.  Due to hx of IBS, pt advised to contact clinic Friday if symptoms persist in intensity or worsen.  Will suggest CT of the abdomen for further diagnosis.        Elevated PSA    Pt originally scheduled for PSA levels tomorrow, 10/5 but labs drawn today during acute/episodic visit.  Will f/u with patient on results of labs once completed.         Follow up plan: Return if symptoms worsen or fail to improve.

## 2022-08-09 ENCOUNTER — Other Ambulatory Visit: Payer: Managed Care, Other (non HMO)

## 2022-08-09 LAB — PSA: Prostate Specific Ag, Serum: 5.1 ng/mL — ABNORMAL HIGH (ref 0.0–4.0)

## 2022-08-16 ENCOUNTER — Other Ambulatory Visit: Payer: Self-pay | Admitting: Family Medicine

## 2022-08-16 DIAGNOSIS — R972 Elevated prostate specific antigen [PSA]: Secondary | ICD-10-CM

## 2022-08-17 ENCOUNTER — Telehealth: Payer: Self-pay | Admitting: Family Medicine

## 2022-08-17 NOTE — Telephone Encounter (Signed)
Copied from [place] [id number]. Topic: General - Other >> Aug 17, 2022 10:03 AM Everette C wrote: Reason for CRM: The patient would like to review their labs from 08/08/22 with a member of clinical staff when possible  Please contact further when available

## 2022-08-17 NOTE — Telephone Encounter (Signed)
Patient is aware of lab results, no further questions. Will await call from urology.

## 2022-09-03 ENCOUNTER — Ambulatory Visit: Payer: Managed Care, Other (non HMO) | Admitting: Urology

## 2022-09-03 ENCOUNTER — Encounter: Payer: Self-pay | Admitting: Urology

## 2022-09-03 VITALS — BP 133/86 | HR 92 | Ht 69.0 in | Wt 200.0 lb

## 2022-09-03 DIAGNOSIS — R972 Elevated prostate specific antigen [PSA]: Secondary | ICD-10-CM

## 2022-09-03 LAB — URINALYSIS, COMPLETE
Bilirubin, UA: NEGATIVE
Glucose, UA: NEGATIVE
Ketones, UA: NEGATIVE
Leukocytes,UA: NEGATIVE
Nitrite, UA: NEGATIVE
RBC, UA: NEGATIVE
Specific Gravity, UA: 1.025 (ref 1.005–1.030)
Urobilinogen, Ur: 1 mg/dL (ref 0.2–1.0)
pH, UA: 5.5 (ref 5.0–7.5)

## 2022-09-03 LAB — MICROSCOPIC EXAMINATION

## 2022-09-03 NOTE — Progress Notes (Signed)
09/03/2022 9:49 AM   Jesse Dunlap [birth year] [id number]  Referring provider: Valerie Roys, DO [place],  [place] [address]  Chief Complaint  Patient presents with   Elevated PSA    HPI: Jesse Dunlap is a 56 y.o. male referred for evaluation of an elevated PSA.  Intermittent PSA elevation dating back to June 2021 On TRT for hypogonadism; had stopped for 60 days at the time of his last PSA 08/08/2022 No bothersome LUTS Denies dysuria, gross hematuria No family history prostate cancer Denies prior urologic evaluation PSA trend    PMH: Past Medical History:  Diagnosis Date   Attention deficit disorder    Diabetes mellitus without complication ([place])    prior to gastric sleeve and wt loss   Family history of breast cancer    Hyperlipidemia    Hypertension    prior to gastric sleeve and wt loss   Hypogonadism in male    Obesity (BMI 35.0-39.9 without comorbidity)    OSA on CPAP     Surgical History: Past Surgical History:  Procedure Laterality Date   CARDIAC CATHETERIZATION  approx 2000   COLONOSCOPY  approx 2005   had polyp   COLONOSCOPY WITH PROPOFOL N/A 03/16/2016   Procedure: COLONOSCOPY WITH PROPOFOL;  Surgeon: Lucilla Lame, MD;  Location: [facility];  Service: Endoscopy;  Laterality: N/A;  CPAP   COLONOSCOPY WITH PROPOFOL N/A 02/29/2020   Procedure: COLONOSCOPY WITH BIOPSYr;  Surgeon: Lucilla Lame, MD;  Location: [facility];  Service: Endoscopy;  Laterality: N/A;  sleep apnea Priority 3   LAPAROSCOPIC GASTRIC SLEEVE RESECTION  04/2013   POLYPECTOMY  03/16/2016   Procedure: POLYPECTOMY;  Surgeon: Lucilla Lame, MD;  Location: [facility];  Service: Endoscopy;;   POLYPECTOMY N/A 02/29/2020   Procedure: POLYPECTOMY;  Surgeon: Lucilla Lame, MD;  Location: [facility];  Service: Endoscopy;  Laterality: N/A;   RHINOPLASTY      Home Medications:  Allergies as of 09/03/2022       Reactions   Tramadol Itching         Medication List        Accurate as of September 03, 2022  9:49 AM. If you have any questions, ask your nurse or doctor.          albuterol 108 (90 Base) MCG/ACT inhaler Commonly known as: VENTOLIN HFA Inhale 2 puffs into the lungs every 6 (six) hours as needed for wheezing or shortness of breath.   amLODipine 10 MG tablet Commonly known as: NORVASC Take 1 tablet (10 mg total) by mouth daily.   aspirin EC 81 MG tablet Take 81 mg by mouth daily.   benazepril 40 MG tablet Commonly known as: LOTENSIN Take 1 tablet (40 mg total) by mouth daily.   fexofenadine 180 MG tablet Commonly known as: Allegra Allergy Take 1 tablet (180 mg total) by mouth daily.   gabapentin 100 MG capsule Commonly known as: NEURONTIN Take 1 capsule (100 mg total) by mouth at bedtime.   hydrochlorothiazide 25 MG tablet Commonly known as: HYDRODIURIL Take 1 tablet (25 mg total) by mouth daily.   testosterone 50 MG/5GM (1%) Gel Commonly known as: ANDROGEL APPLY 1 PACKET ONTO THE SKIN ONCE DAILY   venlafaxine XR 75 MG 24 hr capsule Commonly known as: EFFEXOR-XR Take 1 capsule (75 mg total) by mouth daily with breakfast. To be taken with the 174m for 2259mtotal   venlafaxine XR 150 MG 24 hr capsule Commonly known as: EFFEXOR-XR  TAKE 1 CAPSULE BY MOUTH ONCE DAILY WITH BREAKFAST AS DIRECTED ALONG WITH 75MG = 225MG        Allergies:  Allergies  Allergen Reactions   Tramadol Itching    Family History: Family History  Problem Relation Age of Onset   Diabetes Mother    Breast cancer Mother 75       CHEK2 pos   Atrial fibrillation Mother    Thyroid disease Mother    Diabetes Father    Hypertension Father    Skin cancer Father    Breast cancer Sister 23   Alzheimer's disease Maternal Grandmother    Breast cancer Maternal Grandmother        dx over 62   Prostate cancer Maternal Grandfather        dx in his 65s-70s   Lung cancer Paternal Grandfather    Prostate cancer Other         PGF's father    Social History:  reports that he quit smoking about 35 years ago. His smoking use included cigarettes. He has a 15.00 pack-year smoking history. He has never used smokeless tobacco. He reports current alcohol use of about 4.0 - 5.0 standard drinks of alcohol per week. He reports that he does not use drugs.   Physical Exam: BP 133/86   Pulse 92   Ht _0  (1.753 m)   Wt 200 lb (90.7 kg)   BMI 29.53 kg/m   Constitutional:  Alert and oriented, No acute distress. HEENT: [place] AT Respiratory: Normal respiratory effort, no increased work of breathing. GU: Prostate 35 g, smooth without nodules Skin: No rashes, bruises or suspicious lesions. Neurologic: Grossly intact, no focal deficits, moving all 4 extremities. Psychiatric: Normal mood and affect.   Assessment & Plan:    1.  Elevated PSA Although PSA is a prostate cancer screening test he was informed that cancer is not the most common cause of an elevated PSA. Other potential causes including BPH and inflammation were discussed. He was informed that the only way to adequately diagnose prostate cancer would be a transrectal ultrasound and biopsy of the prostate. The procedure was discussed including potential risks of bleeding and infection/sepsis. He was also informed that a negative biopsy does not conclusively rule out the possibility that prostate cancer may be present and that continued monitoring is required. The use of newer adjunctive blood tests including 4kScore was discussed. The use of multiparametric prostate MRI to evaluate for abnormality suspicious for high-grade prostate cancer and aid in targeted biopsy was reviewed. Continued periodic surveillance was also discussed. Recommend prostate MRI as initial evaluation   Abbie Sons, MD  [facility] [address], [place], [place] [address] [phone]

## 2022-09-07 NOTE — Addendum Note (Signed)
Addended by: John Giovanni C on: 09/07/2022 11:42 AM   Modules accepted: Orders

## 2022-09-11 ENCOUNTER — Ambulatory Visit: Payer: Managed Care, Other (non HMO)

## 2022-09-12 ENCOUNTER — Other Ambulatory Visit: Payer: Self-pay | Admitting: Urology

## 2022-09-12 DIAGNOSIS — R972 Elevated prostate specific antigen [PSA]: Secondary | ICD-10-CM

## 2022-10-09 ENCOUNTER — Telehealth: Payer: Self-pay | Admitting: Urology

## 2022-10-09 ENCOUNTER — Ambulatory Visit
Admission: RE | Admit: 2022-10-09 | Discharge: 2022-10-09 | Disposition: A | Discharge status: Home / Self Care | Payer: Managed Care, Other (non HMO) | Source: Ambulatory Visit | Attending: Urology | Admitting: Urology

## 2022-10-09 DIAGNOSIS — R972 Elevated prostate specific antigen [PSA]: Secondary | ICD-10-CM

## 2022-10-09 MED ORDER — GADOPICLENOL 0.5 MMOL/ML IV SOLN
10.0000 mL | Freq: Once | INTRAVENOUS | Status: AC | PRN
  Administered 2022-10-09: 10 mL via INTRAVENOUS

## 2022-10-09 NOTE — Telephone Encounter (Signed)
Prostate MRI showed no abnormality suspicious for high-grade cancer.  The false-negative rate of an MRI is 15-20% meaning cancer can still be present with a negative MRI.  PSA level and prostate size is more indicative of benign enlargement.  Options at this point: Schedule standard prostate biopsy in office Follow-up visit April 2024 with a PSA prior Schedule follow-up office visit if he wants to discuss further.

## 2022-10-10 ENCOUNTER — Encounter: Payer: Self-pay | Admitting: Urology

## 2022-10-10 NOTE — Telephone Encounter (Signed)
Notified patient as instructed, patient pleased. Discussed follow-up appointments, patient agrees. Patient to follow up in April 2024

## 2022-12-16 ENCOUNTER — Other Ambulatory Visit: Payer: Self-pay | Admitting: Family Medicine

## 2022-12-17 NOTE — Telephone Encounter (Signed)
Requested medication (s) are due for refill today: yes  Requested medication (s) are on the active medication list: yes  Last refill:  01/02/22 150 grams 5 RF  Future visit scheduled: yes  Notes to clinic:  med. Not assigned to a protocol   Requested Prescriptions  Pending Prescriptions Disp Refills   testosterone (ANDROGEL) 50 MG/5GM (1%) GEL [Pharmacy Med Name: TESTOSTERONE 50 MG/5 GRAM GEL] 150 g     Sig: APPLY 1 TUBE ON SKIN ONCE A DAY     Off-Protocol Failed - 12/16/2022  2:47 PM      Failed - Medication not assigned to a protocol, review manually.      Passed - Valid encounter within last 12 months    Recent Outpatient Visits           4 months ago Diarrhea, unspecified type   [place], Karen, NP   5 months ago Routine general medical examination at a health care facility   [facility], Megan P, DO   10 months ago Type 2 diabetes mellitus with stage 2 chronic kidney disease, without long-term current use of insulin ([place])   [place], Megan P, DO   10 months ago Viral upper respiratory tract infection   [facility], Avanti, MD   1 year ago Hypogonadism in male   [place], [place], DO       Future Appointments             Tomorrow Venita Lick, NP [place]   In 2 months Stoioff, Ronda Fairly, MD [place]

## 2022-12-18 ENCOUNTER — Ambulatory Visit: Payer: Managed Care, Other (non HMO) | Admitting: Nurse Practitioner

## 2022-12-18 ENCOUNTER — Encounter: Payer: Self-pay | Admitting: Nurse Practitioner

## 2022-12-18 ENCOUNTER — Other Ambulatory Visit: Payer: Self-pay | Admitting: Nurse Practitioner

## 2022-12-18 VITALS — BP 137/85 | HR 82 | Temp 98.1°F | Ht 69.02 in | Wt 210.2 lb

## 2022-12-18 DIAGNOSIS — N6311 Unspecified lump in the right breast, upper outer quadrant: Secondary | ICD-10-CM | POA: Diagnosis not present

## 2022-12-18 NOTE — Assessment & Plan Note (Signed)
Noted 4 to 5 days ago, due to gene mutation and risk for breast cancer will obtain imaging.  Discussed with patient and he is aware to call to schedule, number provided.  Will determine next steps after return of imaging.

## 2022-12-18 NOTE — Progress Notes (Signed)
BP 137/85   Pulse 82   Temp 98.1 F (36.7 C) (Oral)   Ht 5' 9.02" (1.753 m)   Wt 210 lb 3.2 oz (95.3 kg)   SpO2 96%   BMI 31.03 kg/m    Subjective:    Patient ID: Jesse Dunlap, male    DOB: 1966-05-22, 57 y.o.   MRN: [id number]  HPI: Jesse Dunlap is a 57 y.o. male  Chief Complaint  Patient presents with   Lump on chest    Right chest patinet noticed a lump 4 to 5 days ago   CHEST MASS About 4 to 5 days ago noticed a mass noted right chest.  Has not noticed this area prior, new finding.  No pain to area. Family history of breast cancer, does have known gene mutation.  Lost sister to breast cancer 2 years ago + mother had double mastectomy.   Duration :days Location: right Redness: no Swelling: no Trauma: no trauma Status: stable Treatments attempted: none Previous mammogram: no   Relevant past medical, surgical, family and social history reviewed and updated as indicated. Interim medical history since our last visit reviewed. Allergies and medications reviewed and updated.  Review of Systems  Constitutional:  Negative for activity change, diaphoresis, fatigue and fever.  Respiratory:  Negative for cough, chest tightness, shortness of breath and wheezing.   Cardiovascular:  Negative for chest pain, palpitations and leg swelling.  Gastrointestinal: Negative.   Neurological: Negative.   Psychiatric/Behavioral: Negative.     Per HPI unless specifically indicated above     Objective:    BP 137/85   Pulse 82   Temp 98.1 F (36.7 C) (Oral)   Ht 5' 9.02" (1.753 m)   Wt 210 lb 3.2 oz (95.3 kg)   SpO2 96%   BMI 31.03 kg/m   Wt Readings from Last 3 Encounters:  12/18/22 210 lb 3.2 oz (95.3 kg)  09/03/22 200 lb (90.7 kg)  08/08/22 200 lb (90.7 kg)    Physical Exam Vitals and nursing note reviewed.  Constitutional:      General: He is awake. He is not in acute distress.    Appearance: He is well-developed and well-groomed. He is obese. He is not  ill-appearing or toxic-appearing.  HENT:     Head: Normocephalic.  Eyes:     General: Lids are normal.     Extraocular Movements: Extraocular movements intact.     Conjunctiva/sclera: Conjunctivae normal.  Neck:     Thyroid: No thyromegaly.     Vascular: No carotid bruit.  Cardiovascular:     Rate and Rhythm: Normal rate and regular rhythm.     Heart sounds: Normal heart sounds.  Pulmonary:     Effort: No accessory muscle usage or respiratory distress.     Breath sounds: Normal breath sounds.  Chest:  Breasts:    Right: Mass present. No swelling, bleeding, inverted nipple, skin change or tenderness.     Left: Normal.    Abdominal:     General: Bowel sounds are normal. There is no distension.     Palpations: Abdomen is soft.     Tenderness: There is no abdominal tenderness.  Musculoskeletal:     Cervical back: Full passive range of motion without pain.     Right lower leg: No edema.     Left lower leg: No edema.  Lymphadenopathy:     Cervical: No cervical adenopathy.     Upper Body:     Right upper body: No supraclavicular, axillary  or pectoral adenopathy.     Left upper body: No supraclavicular, axillary or pectoral adenopathy.  Skin:    General: Skin is warm.     Capillary Refill: Capillary refill takes less than 2 seconds.  Neurological:     Mental Status: He is alert and oriented to person, place, and time.     Deep Tendon Reflexes: Reflexes are normal and symmetric.     Reflex Scores:      Brachioradialis reflexes are 2+ on the right side and 2+ on the left side.      Patellar reflexes are 2+ on the right side and 2+ on the left side. Psychiatric:        Attention and Perception: Attention normal.        Mood and Affect: Mood normal.        Speech: Speech normal.        Behavior: Behavior normal. Behavior is cooperative.        Thought Content: Thought content normal.    Results for orders placed or performed in visit on 09/03/22  Microscopic Examination    Urine  Result Value Ref Range   WBC, UA 0-5 0 - 5 /hpf   RBC, Urine 0-2 0 - 2 /hpf   Epithelial Cells (non renal) 0-10 0 - 10 /hpf   Mucus, UA Present (A) Not Estab.   Bacteria, UA Moderate (A) None seen/Few  Urinalysis, Complete  Result Value Ref Range   Specific Gravity, UA 1.025 1.005 - 1.030   pH, UA 5.5 5.0 - 7.5   Color, UA Yellow Yellow   Appearance Ur Clear Clear   Leukocytes,UA Negative Negative   Protein,UA Trace (A) Negative/Trace   Glucose, UA Negative Negative   Ketones, UA Negative Negative   RBC, UA Negative Negative   Bilirubin, UA Negative Negative   Urobilinogen, Ur 1.0 0.2 - 1.0 mg/dL   Nitrite, UA Negative Negative   Microscopic Examination See below:       Assessment & Plan:   Problem List Items Addressed This Visit       Other   Mass of upper outer quadrant of right breast - Primary    Noted 4 to 5 days ago, due to gene mutation and risk for breast cancer will obtain imaging.  Discussed with patient and he is aware to call to schedule, number provided.  Will determine next steps after return of imaging.       Relevant Orders   US BREAST LTD UNI RIGHT INC AXILLA     Follow up plan: Return if symptoms worsen or fail to improve.

## 2022-12-18 NOTE — Patient Instructions (Signed)
Please call to schedule your mammogram and/or bone density: [facility] [place]: [address], [place], [place] [address] Phone: [phone]  [facility] [address] [place],  [place]  [address] Phone: [phone]    Breast Cyst  A breast cyst is a sac in the breast that is filled with fluid. These cysts are usually noncancerous (benign). Breast cysts are most often in the upper, outer portion of the breast. One or more cysts may develop. They form when fluid builds up inside the breast glands. There are several types of breast cysts. Some are too small to feel but can be seen with imaging tests, such as an X-ray of the breast (mammogram) or ultrasound. Breast cysts do not increase your risk of breast cancer. They usually disappear after you no longer have a menstrual cycle (after menopause), unless you take artificial hormones (are on hormone replacement therapy or menopausal hormone therapy). What are the causes? This condition may be caused by: Blockage of tubes (ducts) in the breast glands, which leads to fluid buildup. Duct blockage may result from: Fibrocystic breast changes. This is a common, benign condition that occurs when women go through hormonal changes during the menstrual cycle. This is a common cause of multiple breast cysts. Overgrowth of breast tissue or breast glands. Scar tissue in the breast from previous surgery. Changes in certain male hormones (estrogen and progesterone). The exact cause of this condition is not known. What increases the risk? You may be more likely to develop breast cysts from 59 years of age until the time of menopause. What are the signs or symptoms? Symptoms of this condition include: Feeling one or more smooth, round, soft lumps (like grapes) in the breast that are easily movable. The lump or lumps may get bigger and more painful before your menstrual period  and get smaller after your menstrual period. They may also be painless. Breast discomfort or pain. How is this diagnosed? This condition may be diagnosed based on: A physical exam. Depending on the size of the cyst, your health care provider may be able to feel it during this exam. Imaging tests, such as a mammogram or ultrasound. Fluid may be removed from the cyst with a needle (fine-needle aspiration) and tested to make sure the cyst is not cancerous. How is this treated? Treatment may not be needed for this condition. Your health care provider may monitor the cyst to see if it goes away on its own. If the cyst is uncomfortable or gets bigger, or if you do not like how the cyst makes your breast look, you may need treatment. Treatment may include: Hormone therapy. Fine-needle aspiration to drain fluid from the cyst. There is a chance of the cyst coming back (recurring) after aspiration. Surgery to remove the cyst. Follow these instructions at home: Self-exams  Talk to your health care provider about breast self-exams. If recommended, do the exams as told. A breast self-exam involves: Comparing your breasts in the mirror. Looking for visible changes in your skin or nipples. Feeling for lumps or changes. Having many breast cysts may make it harder to feel for new lumps. Understand how your breasts normally look and feel, and write down any changes in your breasts. Tell your health care provider about any changes. Eating and drinking Follow instructions from your health care provider about what you may eat and drink. Drink enough fluid to keep your urine pale yellow.  Avoid caffeine. Cut down on salt (sodium) in what you eat and drink, especially before your menstrual period. Too much sodium can cause fluid buildup, breast swelling, and discomfort. General instructions See your health care provider regularly. Get a yearly physical exam. If you are 19-10 years old, get a clinical breast exam  every 1-3 years. After the age of 77 years, get this exam every year. Get mammograms as often as directed. Take over-the-counter and prescription medicines only as told by your health care provider. Wear a supportive bra, especially when exercising. Contact a health care provider if: You feel, or think you feel, a lump in your breast. You notice that both breasts look or feel different than usual. Your breast is still causing pain after your menstrual period is over. You find new lumps or bumps that were not there before. You feel lumps in your armpit. Get help right away if: You have severe pain, tenderness, redness, or warmth in your breast. You have fluid or blood leaking from your nipple. Your breast lump becomes hard and painful. You notice dimpling or wrinkling of the breast or nipple. Summary A breast cyst is a sac in the breast that is filled with fluid. Treatment may not be needed for this condition. If the cyst is uncomfortable or gets bigger, or if you do not like how the cyst makes your breast look, you may need treatment. This information is not intended to replace advice given to you by your health care provider. Make sure you discuss any questions you have with your health care provider. Document Revised: 12/16/2021 Document Reviewed: 12/16/2021 Elsevier Patient Education  [employer].

## 2022-12-21 ENCOUNTER — Ambulatory Visit
Admission: RE | Admit: 2022-12-21 | Discharge: 2022-12-21 | Disposition: A | Discharge status: Home / Self Care | Payer: Managed Care, Other (non HMO) | Source: Ambulatory Visit | Attending: Nurse Practitioner | Admitting: Nurse Practitioner

## 2022-12-21 DIAGNOSIS — N6311 Unspecified lump in the right breast, upper outer quadrant: Secondary | ICD-10-CM

## 2022-12-21 NOTE — Progress Notes (Signed)
Contacted via [place], I see the breast center discussed with you but the area of concern does appear non cancerous, they plan on repeat in 3 months with to recheck:)

## 2022-12-25 ENCOUNTER — Other Ambulatory Visit: Payer: Self-pay | Admitting: Family Medicine

## 2022-12-26 NOTE — Telephone Encounter (Signed)
Requested medication (s) are due for refill today provider review   Requested medication (s) are on the active medication list -yes  Future visit scheduled -no  Last refill: 01/02/22 150g 5RF  Notes to clinic: off protocol medication- provider review   Requested Prescriptions  Pending Prescriptions Disp Refills   testosterone (ANDROGEL) 50 MG/5GM (1%) GEL [Pharmacy Med Name: TESTOSTERONE 50 MG/5 GRAM GEL] 150 g     Sig: APPLY 1 TUBE ON SKIN ONCE A DAY     Off-Protocol Failed - 12/25/2022  3:42 PM      Failed - Medication not assigned to a protocol, review manually.      Passed - Valid encounter within last 12 months    Recent Outpatient Visits           1 week ago Mass of upper outer quadrant of right breast   [place], Henrine Screws T, NP   4 months ago Diarrhea, unspecified type   [place], NP   6 months ago Routine general medical examination at a health care facility   [facility], Megan P, DO   11 months ago Type 2 diabetes mellitus with stage 2 chronic kidney disease, without long-term current use of insulin ([place])   [place], Megan P, DO   11 months ago Viral upper respiratory tract infection   [place], Avanti, MD       Future Appointments             In 1 month Stoioff, Ronda Fairly, MD [facility]               Requested Prescriptions  Pending Prescriptions Disp Refills   testosterone (ANDROGEL) 50 MG/5GM (1%) GEL [Pharmacy Med Name: TESTOSTERONE 50 MG/5 GRAM GEL] 150 g     Sig: APPLY 1 TUBE ON SKIN ONCE A DAY     Off-Protocol Failed - 12/25/2022  3:42 PM      Failed - Medication not assigned to a protocol, review manually.      Passed - Valid encounter within last 12 months    Recent Outpatient Visits           1 week ago Mass of upper outer quadrant of right  breast   [place], Henrine Screws T, NP   4 months ago Diarrhea, unspecified type   [place], Karen, NP   6 months ago Routine general medical examination at a health care facility   [facility], Megan P, DO   11 months ago Type 2 diabetes mellitus with stage 2 chronic kidney disease, without long-term current use of insulin ([place])   [place], Megan P, DO   11 months ago Viral upper respiratory tract infection   [place] Vigg, Avanti, MD       Future Appointments             In 1 month Stoioff, Ronda Fairly, MD [place]

## 2022-12-27 ENCOUNTER — Encounter: Payer: Self-pay | Admitting: Family Medicine

## 2022-12-31 ENCOUNTER — Ambulatory Visit: Payer: Managed Care, Other (non HMO) | Admitting: Family Medicine

## 2023-01-09 MED ORDER — TESTOSTERONE 50 MG/5GM (1%) TD GEL: TRANSDERMAL | 5 refills | Status: DC

## 2023-02-06 ENCOUNTER — Other Ambulatory Visit: Payer: Self-pay

## 2023-02-06 DIAGNOSIS — R972 Elevated prostate specific antigen [PSA]: Secondary | ICD-10-CM

## 2023-02-07 ENCOUNTER — Other Ambulatory Visit: Payer: Managed Care, Other (non HMO)

## 2023-02-07 DIAGNOSIS — R972 Elevated prostate specific antigen [PSA]: Secondary | ICD-10-CM

## 2023-02-08 LAB — TESTOSTERONE: Testosterone: 216 ng/dL — ABNORMAL LOW (ref 264–916)

## 2023-02-08 LAB — PSA: Prostate Specific Ag, Serum: 4.6 ng/mL — ABNORMAL HIGH (ref 0.0–4.0)

## 2023-02-08 LAB — HEMOGLOBIN AND HEMATOCRIT, BLOOD
Hematocrit: 48.2 % (ref 37.5–51.0)
Hemoglobin: 16.6 g/dL (ref 13.0–17.7)

## 2023-02-18 ENCOUNTER — Encounter: Payer: Self-pay | Admitting: Urology

## 2023-02-18 ENCOUNTER — Encounter: Payer: Self-pay | Admitting: Family Medicine

## 2023-02-18 ENCOUNTER — Ambulatory Visit: Payer: Managed Care, Other (non HMO) | Admitting: Urology

## 2023-02-18 VITALS — BP 167/95 | HR 87 | Ht 69.0 in | Wt 210.0 lb

## 2023-02-18 DIAGNOSIS — E291 Testicular hypofunction: Secondary | ICD-10-CM

## 2023-02-18 DIAGNOSIS — R972 Elevated prostate specific antigen [PSA]: Secondary | ICD-10-CM

## 2023-02-18 NOTE — Progress Notes (Signed)
I, DeAsia L Maxie,acting as a scribe for Riki Altes, MD.,have documented all relevant documentation on the behalf of Riki Altes, MD,as directed by  Riki Altes, MD while in the presence of Riki Altes, MD.   02/18/23 8:24 AM   Jesse Dunlap [birth year] [id number]  Referring provider: Dorcas Carrow, DO [address] [place],  [place] [address]  Chief Complaint  Patient presents with   Elevated PSA   Urologic History: Elevated PSA -Intermittent PSA elevation dating back to June 2021  HPI: 57 y.o. male for follow-up of an elevated PSA.  MRI performed 10/2022 for a PSA bump of 5.1. There were no  abnormalities suspicious for high grade prostate cancer and he elected surveillance. He did restart testosterone replacement at one packet of 1% gel daily, which has been prescribed by Dr. Laural Benes Doing well since last visit No bothersome LUTS Denies dysuria, gross hematuria Denies flank, abdominal or pelvic pain  Labs 02/07/23: Testosterone 216; PSA 4.6; HH 16.6 & 48.2  PSA trend  Prostate Specific Ag, Serum  Latest Ref Rng 0.0 - 4.0 ng/mL  04/22/2018 3.3   04/26/2020 5.0 (H)   05/27/2020 4.7 (H)   10/26/2020 3.9   05/04/2021 4.4 (H)   11/01/2021 4.4 (H)   06/29/2022 5.2 (H)   08/08/2022 5.1 (H)   02/07/2023 4.6 (H)      PMH: Past Medical History:  Diagnosis Date   Attention deficit disorder    Diabetes mellitus without complication    prior to gastric sleeve and wt loss   Family history of breast cancer    Hyperlipidemia    Hypertension    prior to gastric sleeve and wt loss   Hypogonadism in male    Obesity (BMI 35.0-39.9 without comorbidity)    OSA on CPAP     Surgical History: Past Surgical History:  Procedure Laterality Date   CARDIAC CATHETERIZATION  approx 2000   COLONOSCOPY  approx 2005   had polyp   COLONOSCOPY WITH PROPOFOL N/A 03/16/2016   Procedure: COLONOSCOPY WITH PROPOFOL;  Surgeon: Midge Minium, MD;  Location: [facility];  Service:  Endoscopy;  Laterality: N/A;  CPAP   COLONOSCOPY WITH PROPOFOL N/A 02/29/2020   Procedure: COLONOSCOPY WITH BIOPSYr;  Surgeon: Midge Minium, MD;  Location: [facility];  Service: Endoscopy;  Laterality: N/A;  sleep apnea Priority 3   LAPAROSCOPIC GASTRIC SLEEVE RESECTION  04/2013   POLYPECTOMY  03/16/2016   Procedure: POLYPECTOMY;  Surgeon: Midge Minium, MD;  Location: [facility];  Service: Endoscopy;;   POLYPECTOMY N/A 02/29/2020   Procedure: POLYPECTOMY;  Surgeon: Midge Minium, MD;  Location: [facility];  Service: Endoscopy;  Laterality: N/A;   RHINOPLASTY      Home Medications:  Allergies as of 02/18/2023       Reactions   Tramadol Itching        Medication List        Accurate as of February 18, 2023  8:24 AM. If you have any questions, ask your nurse or doctor.          albuterol 108 (90 Base) MCG/ACT inhaler Commonly known as: VENTOLIN HFA Inhale 2 puffs into the lungs every 6 (six) hours as needed for wheezing or shortness of breath.   amLODipine 10 MG tablet Commonly known as: NORVASC Take 1 tablet (10 mg total) by mouth daily.   aspirin EC 81 MG tablet Take 81 mg by mouth daily.   benazepril 40 MG tablet  Commonly known as: LOTENSIN Take 1 tablet (40 mg total) by mouth daily.   fexofenadine 180 MG tablet Commonly known as: Allegra Allergy Take 1 tablet (180 mg total) by mouth daily.   gabapentin 100 MG capsule Commonly known as: NEURONTIN Take 1 capsule (100 mg total) by mouth at bedtime.   hydrochlorothiazide 25 MG tablet Commonly known as: HYDRODIURIL Take 1 tablet (25 mg total) by mouth daily.   testosterone 50 MG/5GM (1%) Gel Commonly known as: ANDROGEL APPLY 1 PACKET ONTO THE SKIN ONCE DAILY   venlafaxine XR 75 MG 24 hr capsule Commonly known as: EFFEXOR-XR Take 1 capsule (75 mg total) by mouth daily with breakfast. To be taken with the 150mg  for 225mg  total   venlafaxine XR 150 MG 24 hr capsule Commonly known as:  EFFEXOR-XR TAKE 1 CAPSULE BY MOUTH ONCE DAILY WITH BREAKFAST AS DIRECTED ALONG WITH 75MG  = 225MG         Allergies:  Allergies  Allergen Reactions   Tramadol Itching    Family History: Family History  Problem Relation Age of Onset   Diabetes Mother    Breast cancer Mother 41       CHEK2 pos   Atrial fibrillation Mother    Thyroid disease Mother    Diabetes Father    Hypertension Father    Skin cancer Father    Breast cancer Sister 95   Alzheimer's disease Maternal Grandmother    Breast cancer Maternal Grandmother        dx over 22   Prostate cancer Maternal Grandfather        dx in his 43s-70s   Lung cancer Paternal Grandfather    Prostate cancer Other        PGF's father    Social History:  reports that he quit smoking about 36 years ago. His smoking use included cigarettes. He has a 15.00 pack-year smoking history. He has never used smokeless tobacco. He reports current alcohol use of about 4.0 - 5.0 standard drinks of alcohol per week. He reports that he does not use drugs.   Physical Exam: BP (!) 167/95   Pulse 87   Ht 5\' 9"  (1.753 m)   Wt 210 lb (95.3 kg)   BMI 31.01 kg/m   Constitutional:  Alert and oriented, No acute distress. HEENT: [place] AT Respiratory: Normal respiratory effort, no increased work of breathing. Skin: No rashes, bruises or suspicious lesions. Neurologic: Grossly intact, no focal deficits, moving all 4 extremities. Psychiatric: Normal mood and affect.   Assessment & Plan:    1.  Elevated PSA Recent prostate MRI showed no lesion suspicious for high grade prostate cancer  Follow-up PSA has decreased at 4.6 Labs visit in 6 months with PSA  One year follow-up with PSA/DRE  2. Hypogonadism Managed by Dr. Laural Benes  Testosterone level remains low at 216 On one packet 1% gel daily Would recommend increasing to 2 packets daily  Follow-up testosterone level in 4-6 weeks Would be happy to start prescribing testosterone and monitoring if Dr.  Laural Benes desires   I have reviewed the above documentation for accuracy and completeness, and I agree with the above.   Riki Altes, MD  [facility] [address] [place], [place] [address] [phone]

## 2023-02-22 ENCOUNTER — Other Ambulatory Visit: Payer: Self-pay | Admitting: Family Medicine

## 2023-02-22 DIAGNOSIS — E291 Testicular hypofunction: Secondary | ICD-10-CM

## 2023-02-22 MED ORDER — TESTOSTERONE 50 MG/5GM (1%) TD GEL: 10.0000 g | Freq: Every day | TRANSDERMAL | 1 refills | Status: DC

## 2023-03-05 ENCOUNTER — Other Ambulatory Visit: Payer: Self-pay | Admitting: Family Medicine

## 2023-03-05 DIAGNOSIS — I10 Essential (primary) hypertension: Secondary | ICD-10-CM

## 2023-03-06 NOTE — Telephone Encounter (Signed)
Requested medication (s) are due for refill today:   Yes for all 5  Requested medication (s) are on the active medication list:   Yes for all 5  Future visit scheduled:   No   Last ordered: All 5  06/29/2022 #90, 1 refill  Returned because per protocol the 6 month labs are due.      Requested Prescriptions  Pending Prescriptions Disp Refills   amLODipine (NORVASC) 10 MG tablet [Pharmacy Med Name: AMLODIPINE BESYLATE 10 MG TAB] 90 tablet 1    Sig: TAKE 1 TABLET BY MOUTH EVERY DAY     Cardiovascular: Calcium Channel Blockers 2 Failed - 03/05/2023 12:33 PM      Failed - Last BP in normal range    BP Readings from Last 1 Encounters:  02/18/23 (!) 167/95         Passed - Last Heart Rate in normal range    Pulse Readings from Last 1 Encounters:  02/18/23 87         Passed - Valid encounter within last 6 months    Recent Outpatient Visits           2 months ago Mass of upper outer quadrant of right breast   [place] [facility] [place], Corrie Dandy T, NP   7 months ago Diarrhea, unspecified type   [place] [facility] Larae Grooms, NP   8 months ago Routine general medical examination at a health care facility   [facility], [place] P, DO   1 year ago Type 2 diabetes mellitus with stage 2 chronic kidney disease, without long-term current use of insulin (HCC)   [place] [facility] [place], Megan P, DO   1 year ago Viral upper respiratory tract infection   [place] [facility] Vigg, Avanti, MD       Future Appointments             In 11 months Stoioff, Verna Czech, MD [facility] [place]             hydrochlorothiazide (HYDRODIURIL) 25 MG tablet [Pharmacy Med Name: HYDROCHLOROTHIAZIDE 25 MG TAB] 90 tablet 1    Sig: Take 1 tablet (25 mg total) by mouth daily.     Cardiovascular: Diuretics - Thiazide Failed - 03/05/2023 12:33 PM      Failed - Cr in normal range and  within 180 days    Creatinine  Date Value Ref Range Status  11/28/2012 0.98 0.60 - 1.30 mg/dL Final   Creatinine, Ser  Date Value Ref Range Status  06/29/2022 1.00 0.76 - 1.27 mg/dL Final         Failed - K in normal range and within 180 days    Potassium  Date Value Ref Range Status  06/29/2022 4.1 3.5 - 5.2 mmol/L Final  11/28/2012 3.9 3.5 - 5.1 mmol/L Final         Failed - Na in normal range and within 180 days    Sodium  Date Value Ref Range Status  06/29/2022 141 134 - 144 mmol/L Final  11/28/2012 137 136 - 145 mmol/L Final         Failed - Last BP in normal range    BP Readings from Last 1 Encounters:  02/18/23 (!) 167/95         Passed - Valid encounter within last 6 months    Recent Outpatient Visits  2 months ago Mass of upper outer quadrant of right breast   [place] [facility] [place], Corrie Dandy T, NP   7 months ago Diarrhea, unspecified type   [facility] Larae Grooms, NP   8 months ago Routine general medical examination at a health care facility   [facility] [place] P, DO   1 year ago Type 2 diabetes mellitus with stage 2 chronic kidney disease, without long-term current use of insulin (HCC)   [place] [facility] [place], Megan P, DO   1 year ago Viral upper respiratory tract infection   [place] [facility] Vigg, Avanti, MD       Future Appointments             In 11 months Stoioff, Verna Czech, MD [facility] [place]             benazepril (LOTENSIN) 40 MG tablet [Pharmacy Med Name: BENAZEPRIL HCL 40 MG TABLET] 90 tablet 1    Sig: TAKE 1 TABLET BY MOUTH EVERY DAY     Cardiovascular:  ACE Inhibitors Failed - 03/05/2023 12:33 PM      Failed - Cr in normal range and within 180 days    Creatinine  Date Value Ref Range Status  11/28/2012 0.98 0.60 - 1.30 mg/dL Final   Creatinine, Ser  Date Value Ref Range Status   06/29/2022 1.00 0.76 - 1.27 mg/dL Final         Failed - K in normal range and within 180 days    Potassium  Date Value Ref Range Status  06/29/2022 4.1 3.5 - 5.2 mmol/L Final  11/28/2012 3.9 3.5 - 5.1 mmol/L Final         Failed - Last BP in normal range    BP Readings from Last 1 Encounters:  02/18/23 (!) 167/95         Passed - Patient is not pregnant      Passed - Valid encounter within last 6 months    Recent Outpatient Visits           2 months ago Mass of upper outer quadrant of right breast   [place] [facility] [place], Corrie Dandy T, NP   7 months ago Diarrhea, unspecified type    [place] [facility] Larae Grooms, NP   8 months ago Routine general medical examination at a health care facility   [facility], [place] P, DO   1 year ago Type 2 diabetes mellitus with stage 2 chronic kidney disease, without long-term current use of insulin (HCC)   [place] [facility] [place], Megan P, DO   1 year ago Viral upper respiratory tract infection    [facility] Vigg, Avanti, MD       Future Appointments             In 11 months Stoioff, Verna Czech, MD [facility] [place]             venlafaxine XR (EFFEXOR-XR) 75 MG 24 hr capsule [Pharmacy Med Name: VENLAFAXINE HCL ER 75 MG CAP] 90 capsule 1    Sig: TAKE 1 CAPSULE BY MOUTH DAILY WITH BREAKFAST. TO BE TAKEN WITH THE 150MG  FOR 225MG  TOTAL     Psychiatry: Antidepressants - SNRI - desvenlafaxine & venlafaxine Failed - 03/05/2023 12:33 PM      Failed - Last BP in normal range  BP Readings from Last 1 Encounters:  02/18/23 (!) 167/95         Failed - Lipid Panel in normal range within the last 12 months    Cholesterol, Total  Date Value Ref Range Status  06/29/2022 224 (H) 100 - 199 mg/dL Final   Cholesterol Piccolo, Waived  Date Value Ref Range Status  05/29/2018 214 (H) <200 mg/dL Final     Comment:                            Desirable                <200                         Borderline High      200- 239                         High                     >239    LDL Chol Calc (NIH)  Date Value Ref Range Status  06/29/2022 121 (H) 0 - 99 mg/dL Final   HDL  Date Value Ref Range Status  06/29/2022 50 >39 mg/dL Final   Triglycerides  Date Value Ref Range Status  06/29/2022 303 (H) 0 - 149 mg/dL Final   Triglycerides Piccolo,Waived  Date Value Ref Range Status  05/29/2018 253 (H) <150 mg/dL Final    Comment:                            Normal                   <150                         Borderline High     150 - 199                         High                200 - 499                         Very High                >499          Passed - Cr in normal range and within 360 days    Creatinine  Date Value Ref Range Status  11/28/2012 0.98 0.60 - 1.30 mg/dL Final   Creatinine, Ser  Date Value Ref Range Status  06/29/2022 1.00 0.76 - 1.27 mg/dL Final         Passed - Completed PHQ-2 or PHQ-9 in the last 360 days      Passed - Valid encounter within last 6 months    Recent Outpatient Visits           2 months ago Mass of upper outer quadrant of right breast   [place] [facility] [place], Corrie Dandy T, NP   7 months ago Diarrhea, unspecified type   [place] [facility] Larae Grooms, NP   8 months ago Routine general medical examination at a health care facility   [facility] [place], Megan P, DO   1 year ago Type 2 diabetes mellitus with stage 2 chronic kidney disease, without long-term current use of insulin (HCC)   [place] [facility] [place], Megan P, DO   1 year ago Viral upper respiratory tract infection   [place] [facility] Vigg, Avanti, MD       Future Appointments             In 11 months Stoioff, Verna Czech, MD [facility] [place]              venlafaxine XR (EFFEXOR-XR) 150 MG 24 hr capsule [Pharmacy Med Name: VENLAFAXINE HCL ER 150 MG CAP] 90 capsule 1    Sig: TAKE 1 CAPSULE BY MOUTH ONCE DAILY WITH BREAKFAST AS DIRECTED ALONG WITH 75MG  = 225MG      Psychiatry: Antidepressants - SNRI - desvenlafaxine & venlafaxine Failed - 03/05/2023 12:33 PM      Failed - Last BP in normal range    BP Readings from Last 1 Encounters:  02/18/23 (!) 167/95         Failed - Lipid Panel in normal range within the last 12 months    Cholesterol, Total  Date Value Ref Range Status  06/29/2022 224 (H) 100 - 199 mg/dL Final   Cholesterol Piccolo, Waived  Date Value Ref Range Status  05/29/2018 214 (H) <200 mg/dL Final    Comment:                            Desirable                <200                         Borderline High      200- 239                         High                     >239    LDL Chol Calc (NIH)  Date Value Ref Range Status  06/29/2022 121 (H) 0 - 99 mg/dL Final   HDL  Date Value Ref Range Status  06/29/2022 50 >39 mg/dL Final   Triglycerides  Date Value Ref Range Status  06/29/2022 303 (H) 0 - 149 mg/dL Final   Triglycerides Piccolo,Waived  Date Value Ref Range Status  05/29/2018 253 (H) <150 mg/dL Final    Comment:                            Normal                   <150                         Borderline High     150 - 199                         High                200 - 499                         Very High                >  499          Passed - Cr in normal range and within 360 days    Creatinine  Date Value Ref Range Status  11/28/2012 0.98 0.60 - 1.30 mg/dL Final   Creatinine, Ser  Date Value Ref Range Status  06/29/2022 1.00 0.76 - 1.27 mg/dL Final         Passed - Completed PHQ-2 or PHQ-9 in the last 360 days      Passed - Valid encounter within last 6 months    Recent Outpatient Visits           2 months ago Mass of upper outer quadrant of right breast   [place] [facility] [place], Corrie Dandy T, NP   7 months ago Diarrhea, unspecified type   [facility] Larae Grooms, NP   8 months ago Routine general medical examination at a health care facility   [facility] [place] P, DO   1 year ago Type 2 diabetes mellitus with stage 2 chronic kidney disease, without long-term current use of insulin (HCC)   [facility] [place], Megan P, DO   1 year ago Viral upper respiratory tract infection   [place] [facility] Vigg, Avanti, MD       Future Appointments             In 11 months Stoioff, Verna Czech, MD [facility] [place]

## 2023-03-06 NOTE — Telephone Encounter (Signed)
Attempted to reach patient, LVM to call office back to get scheduled for a follow up per provider.  Also put in CRM.

## 2023-03-06 NOTE — Telephone Encounter (Signed)
Needs follow up appointment.  

## 2023-03-08 NOTE — Telephone Encounter (Signed)
Can you check to see if he has enough to get him to next week?

## 2023-03-08 NOTE — Telephone Encounter (Signed)
Spoke with patient to clarify if he has enough medication to get him to his appointment on Monday with provider. Patient verbalized he does have enough until scheduled appointment. Patient verbalized understanding.

## 2023-03-11 ENCOUNTER — Ambulatory Visit: Payer: Managed Care, Other (non HMO) | Admitting: Family Medicine

## 2023-03-11 ENCOUNTER — Encounter: Payer: Self-pay | Admitting: Family Medicine

## 2023-03-11 VITALS — BP 128/79 | HR 81 | Temp 98.3°F | Ht 69.0 in | Wt 210.8 lb

## 2023-03-11 DIAGNOSIS — I10 Essential (primary) hypertension: Secondary | ICD-10-CM

## 2023-03-11 DIAGNOSIS — E7800 Pure hypercholesterolemia, unspecified: Secondary | ICD-10-CM

## 2023-03-11 DIAGNOSIS — E1122 Type 2 diabetes mellitus with diabetic chronic kidney disease: Secondary | ICD-10-CM

## 2023-03-11 DIAGNOSIS — N182 Chronic kidney disease, stage 2 (mild): Secondary | ICD-10-CM

## 2023-03-11 DIAGNOSIS — R972 Elevated prostate specific antigen [PSA]: Secondary | ICD-10-CM | POA: Diagnosis not present

## 2023-03-11 DIAGNOSIS — E291 Testicular hypofunction: Secondary | ICD-10-CM | POA: Diagnosis not present

## 2023-03-11 DIAGNOSIS — F321 Major depressive disorder, single episode, moderate: Secondary | ICD-10-CM

## 2023-03-11 LAB — BAYER DCA HB A1C WAIVED: HB A1C (BAYER DCA - WAIVED): 8.9 % — ABNORMAL HIGH (ref 4.8–5.6)

## 2023-03-11 LAB — URINALYSIS, ROUTINE W REFLEX MICROSCOPIC
Bilirubin, UA: NEGATIVE
Leukocytes,UA: NEGATIVE
Nitrite, UA: NEGATIVE
RBC, UA: NEGATIVE
Specific Gravity, UA: 1.02 (ref 1.005–1.030)
Urobilinogen, Ur: 1 mg/dL (ref 0.2–1.0)
pH, UA: 5.5 (ref 5.0–7.5)

## 2023-03-11 LAB — MICROALBUMIN, URINE WAIVED
Creatinine, Urine Waived: 200 mg/dL (ref 10–300)
Microalb, Ur Waived: 80 mg/L — ABNORMAL HIGH (ref 0–19)

## 2023-03-11 MED ORDER — VENLAFAXINE HCL ER 75 MG PO CP24: 75.0000 mg | ORAL_CAPSULE | Freq: Every day | ORAL | 1 refills | Status: DC

## 2023-03-11 MED ORDER — AMLODIPINE BESYLATE 10 MG PO TABS: 10.0000 mg | ORAL_TABLET | Freq: Every day | ORAL | 1 refills | Status: DC

## 2023-03-11 MED ORDER — HYDROCHLOROTHIAZIDE 25 MG PO TABS: 25.0000 mg | ORAL_TABLET | Freq: Every day | ORAL | 1 refills | Status: DC

## 2023-03-11 MED ORDER — BENAZEPRIL HCL 40 MG PO TABS: 40.0000 mg | ORAL_TABLET | Freq: Every day | ORAL | 1 refills | Status: DC

## 2023-03-11 MED ORDER — VENLAFAXINE HCL ER 150 MG PO CP24: ORAL_CAPSULE | ORAL | 1 refills | Status: DC

## 2023-03-11 NOTE — Assessment & Plan Note (Signed)
Under good control on current regimen. Continue current regimen. Continue to monitor. Call with any concerns. Refills given. Labs drawn today.   

## 2023-03-11 NOTE — Assessment & Plan Note (Signed)
Has only been taking 5mg  daily- rechecking labs today, likely will be low, increase to 10mg  daily and we'll recheck next visit.

## 2023-03-11 NOTE — Progress Notes (Signed)
BP 128/79   Pulse 81   Temp 98.3 F (36.8 C) (Oral)   Ht 5\' 9"  (1.753 m)   Wt 210 lb 12.8 oz (95.6 kg)   SpO2 98%   BMI 31.13 kg/m    Subjective:    Patient ID: Jesse Dunlap, male    DOB: [birth year], 56 y.o.   MRN: [id number]  HPI: Jesse Dunlap is a 57 y.o. male  Chief Complaint  Patient presents with   Hypertension   Diabetes    Patient declines having a recent Diabetic Eye Exam. Patient most recent Diabetic Eye Exam was requested at today's visit.   Depression   DIABETES Hypoglycemic episodes:no Polydipsia/polyuria: no Visual disturbance: no Chest pain: no Paresthesias: no Taking Insulin?: no Blood Pressure Monitoring: not checking Retinal Examination: Not up to Date Foot Exam: Up to Date Diabetic Education: Completed Pneumovax: Up to Date Influenza: Up to Date Aspirin: yes  HYPERTENSION / HYPERLIPIDEMIA Satisfied with current treatment? yes Duration of hypertension: chronic BP monitoring frequency: not checking BP medication side effects: no Past BP meds: amlodipine, HCTZ, benazepril Duration of hyperlipidemia: chronic Cholesterol medication side effects: no Cholesterol supplements: none Past cholesterol medications: none Medication compliance: excellent compliance Aspirin: yes Recent stressors: no Recurrent headaches: no Visual changes: no Palpitations: no Dyspnea: no Chest pain: no Lower extremity edema: no Dizzy/lightheaded: no  DEPRESSION Mood status: controlled Satisfied with current treatment?: yes Symptom severity: mild  Duration of current treatment : chronic Side effects: no Medication compliance: excellent compliance Psychotherapy/counseling: no  Previous psychiatric medications: effexor Depressed mood: no Anxious mood: no Anhedonia: no Significant weight loss or gain: no Insomnia: no  Fatigue: yes Feelings of worthlessness or guilt: no Impaired concentration/indecisiveness: no Suicidal ideations: no Hopelessness:  no Crying spells: no    03/11/2023    9:06 AM 12/18/2022   11:05 AM 08/08/2022    9:16 AM 06/29/2022   10:48 AM 01/30/2022    8:50 AM  Depression screen PHQ 2/9  Decreased Interest 1 0 1 1 1   Down, Depressed, Hopeless 0 0 0 1 1  PHQ - 2 Score 1 0 1 2 2   Altered sleeping 0 0 0 0 1  Tired, decreased energy 0 0 0 0 0  Change in appetite 0 0 0 0 1  Feeling bad or failure about yourself  0 0 0 0 1  Trouble concentrating 0 0 0 0 0  Moving slowly or fidgety/restless 0 0 0 0 0  Suicidal thoughts 0 0 0 0 0  PHQ-9 Score 1 0 1 2 5   Difficult doing work/chores Not difficult at all Not difficult at all Not difficult at all Not difficult at all    LOW TESTOSTERONE- has still been doing the 5mg  daily Duration: chronic Status: stable  Satisfied with current treatment:  yes Previous testosterone therapies: androgel Medication side effects:  no Medication compliance: excellent compliance Decreased libido: no Fatigue: yes Depressed mood: no Muscle weakness: no Erectile dysfunction: no  Relevant past medical, surgical, family and social history reviewed and updated as indicated. Interim medical history since our last visit reviewed. Allergies and medications reviewed and updated.  Review of Systems  Constitutional: Negative.   Respiratory: Negative.    Cardiovascular: Negative.   Gastrointestinal: Negative.   Musculoskeletal: Negative.   Neurological: Negative.   Psychiatric/Behavioral: Negative.      Per HPI unless specifically indicated above     Objective:    BP 128/79   Pulse 81   Temp 98.3 F (36.8 C) (Oral)  Ht 5\' 9"  (1.753 m)   Wt 210 lb 12.8 oz (95.6 kg)   SpO2 98%   BMI 31.13 kg/m   Wt Readings from Last 3 Encounters:  03/11/23 210 lb 12.8 oz (95.6 kg)  02/18/23 210 lb (95.3 kg)  12/18/22 210 lb 3.2 oz (95.3 kg)    Physical Exam Vitals and nursing note reviewed.  Constitutional:      General: He is not in acute distress.    Appearance: Normal appearance. He is  obese. He is not ill-appearing, toxic-appearing or diaphoretic.  HENT:     Head: Normocephalic and atraumatic.     Right Ear: External ear normal.     Left Ear: External ear normal.     Nose: Nose normal.     Mouth/Throat:     Mouth: Mucous membranes are moist.     Pharynx: Oropharynx is clear.  Eyes:     General: No scleral icterus.       Right eye: No discharge.        Left eye: No discharge.     Extraocular Movements: Extraocular movements intact.     Conjunctiva/sclera: Conjunctivae normal.     Pupils: Pupils are equal, round, and reactive to light.  Cardiovascular:     Rate and Rhythm: Normal rate and regular rhythm.     Pulses: Normal pulses.     Heart sounds: Normal heart sounds. No murmur heard.    No friction rub. No gallop.  Pulmonary:     Effort: Pulmonary effort is normal. No respiratory distress.     Breath sounds: Normal breath sounds. No stridor. No wheezing, rhonchi or rales.  Chest:     Chest wall: No tenderness.  Musculoskeletal:        General: Normal range of motion.     Cervical back: Normal range of motion and neck supple.  Skin:    General: Skin is warm and dry.     Capillary Refill: Capillary refill takes less than 2 seconds.     Coloration: Skin is not jaundiced or pale.     Findings: No bruising, erythema, lesion or rash.  Neurological:     General: No focal deficit present.     Mental Status: He is alert and oriented to person, place, and time. Mental status is at baseline.  Psychiatric:        Mood and Affect: Mood normal.        Behavior: Behavior normal.        Thought Content: Thought content normal.        Judgment: Judgment normal.     Results for orders placed or performed in visit on 03/11/23  Bayer DCA Hb A1c Waived  Result Value Ref Range   HB A1C (BAYER DCA - WAIVED) 8.9 (H) 4.8 - 5.6 %  Microalbumin, Urine Waived  Result Value Ref Range   Microalb, Ur Waived 80 (H) 0 - 19 mg/L   Creatinine, Urine Waived 200 10 - 300 mg/dL    Microalb/Creat Ratio 30-300 (H) <30 mg/g  Urinalysis, Routine w reflex microscopic  Result Value Ref Range   Specific Gravity, UA 1.020 1.005 - 1.030   pH, UA 5.5 5.0 - 7.5   Color, UA Yellow Yellow   Appearance Ur Clear Clear   Leukocytes,UA Negative Negative   Protein,UA Trace (A) Negative/Trace   Glucose, UA 2+ (A) Negative   Ketones, UA Trace (A) Negative   RBC, UA Negative Negative   Bilirubin, UA Negative Negative  Urobilinogen, Ur 1.0 0.2 - 1.0 mg/dL   Nitrite, UA Negative Negative   Microscopic Examination Comment       Assessment & Plan:   Problem List Items Addressed This Visit       Cardiovascular and Mediastinum   Hypertension    Under good control on current regimen. Continue current regimen. Continue to monitor. Call with any concerns. Refills given. Labs drawn today.        Relevant Medications   amLODipine (NORVASC) 10 MG tablet   benazepril (LOTENSIN) 40 MG tablet   hydrochlorothiazide (HYDRODIURIL) 25 MG tablet   Other Relevant Orders   CBC with Differential/Platelet   Comprehensive metabolic panel   Microalbumin, Urine Waived (Completed)   Urinalysis, Routine w reflex microscopic (Completed)     Endocrine   Type 2 diabetes mellitus with stage 2 chronic kidney disease, without long-term current use of insulin (HCC)    Not under good control with A1c of 8.9. Will work on diet and exercise and recheck 3 months. If still high, will start medicine next visit.       Relevant Medications   benazepril (LOTENSIN) 40 MG tablet   Other Relevant Orders   Bayer DCA Hb A1c Waived (Completed)   CBC with Differential/Platelet   Urinalysis, Routine w reflex microscopic (Completed)   Hypogonadism in male - Primary    Has only been taking 5mg  daily- rechecking labs today, likely will be low, increase to 10mg  daily and we'll recheck next visit.       Relevant Orders   Testosterone, free, total([facility])     Other   Depression, major, single episode,  moderate (HCC)    Under good control on current regimen. Continue current regimen. Continue to monitor. Call with any concerns. Refills given.        Relevant Medications   venlafaxine XR (EFFEXOR-XR) 150 MG 24 hr capsule   venlafaxine XR (EFFEXOR-XR) 75 MG 24 hr capsule   Hypercholesteremia    Under good control on current regimen. Continue current regimen. Continue to monitor. Call with any concerns. Refills given. Labs drawn today.       Relevant Medications   amLODipine (NORVASC) 10 MG tablet   benazepril (LOTENSIN) 40 MG tablet   hydrochlorothiazide (HYDRODIURIL) 25 MG tablet   Other Relevant Orders   CBC with Differential/Platelet   Comprehensive metabolic panel   Lipid Panel w/o Chol/HDL Ratio   Elevated PSA    Rechecking labs today. Await results. Treat as needed.      Relevant Orders   PSA   Other Visit Diagnoses     Essential hypertension       Relevant Medications   amLODipine (NORVASC) 10 MG tablet   benazepril (LOTENSIN) 40 MG tablet   hydrochlorothiazide (HYDRODIURIL) 25 MG tablet        Follow up plan: Return in about 3 months (around 06/11/2023).

## 2023-03-11 NOTE — Assessment & Plan Note (Signed)
Not under good control with A1c of 8.9. Will work on diet and exercise and recheck 3 months. If still high, will start medicine next visit.

## 2023-03-11 NOTE — Assessment & Plan Note (Signed)
Rechecking labs today. Await results. Treat as needed.  °

## 2023-03-11 NOTE — Assessment & Plan Note (Signed)
Under good control on current regimen. Continue current regimen. Continue to monitor. Call with any concerns. Refills given.   

## 2023-03-16 LAB — CBC WITH DIFFERENTIAL/PLATELET
Basophils Absolute: 0 10*3/uL (ref 0.0–0.2)
Basos: 1 %
EOS (ABSOLUTE): 0.2 10*3/uL (ref 0.0–0.4)
Eos: 4 %
Hematocrit: 47.5 % (ref 37.5–51.0)
Hemoglobin: 16.2 g/dL (ref 13.0–17.7)
Immature Grans (Abs): 0 10*3/uL (ref 0.0–0.1)
Immature Granulocytes: 0 %
Lymphocytes Absolute: 1.2 10*3/uL (ref 0.7–3.1)
Lymphs: 32 %
MCH: 32.7 pg (ref 26.6–33.0)
MCHC: 34.1 g/dL (ref 31.5–35.7)
MCV: 96 fL (ref 79–97)
Monocytes Absolute: 0.3 10*3/uL (ref 0.1–0.9)
Monocytes: 9 %
Neutrophils Absolute: 2 10*3/uL (ref 1.4–7.0)
Neutrophils: 54 %
Platelets: 173 10*3/uL (ref 150–450)
RBC: 4.96 x10E6/uL (ref 4.14–5.80)
RDW: 12.2 % (ref 11.6–15.4)
WBC: 3.7 10*3/uL (ref 3.4–10.8)

## 2023-03-16 LAB — COMPREHENSIVE METABOLIC PANEL
ALT: 53 IU/L — ABNORMAL HIGH (ref 0–44)
AST: 33 IU/L (ref 0–40)
Albumin/Globulin Ratio: 1.5 (ref 1.2–2.2)
Albumin: 4.2 g/dL (ref 3.8–4.9)
Alkaline Phosphatase: 79 IU/L (ref 44–121)
BUN/Creatinine Ratio: 13 (ref 9–20)
BUN: 13 mg/dL (ref 6–24)
Bilirubin Total: 0.8 mg/dL (ref 0.0–1.2)
CO2: 25 mmol/L (ref 20–29)
Calcium: 9.4 mg/dL (ref 8.7–10.2)
Chloride: 101 mmol/L (ref 96–106)
Creatinine, Ser: 1 mg/dL (ref 0.76–1.27)
Globulin, Total: 2.8 g/dL (ref 1.5–4.5)
Glucose: 284 mg/dL — ABNORMAL HIGH (ref 70–99)
Potassium: 4.1 mmol/L (ref 3.5–5.2)
Sodium: 140 mmol/L (ref 134–144)
Total Protein: 7 g/dL (ref 6.0–8.5)
eGFR: 87 mL/min/{1.73_m2} (ref >59)

## 2023-03-16 LAB — LIPID PANEL W/O CHOL/HDL RATIO
Cholesterol, Total: 238 mg/dL — ABNORMAL HIGH (ref 100–199)
HDL: 55 mg/dL (ref >39)
LDL Chol Calc (NIH): 146 mg/dL — ABNORMAL HIGH (ref 0–99)
Triglycerides: 206 mg/dL — ABNORMAL HIGH (ref 0–149)
VLDL Cholesterol Cal: 37 mg/dL (ref 5–40)

## 2023-03-16 LAB — PSA: Prostate Specific Ag, Serum: 5.1 ng/mL — ABNORMAL HIGH (ref 0.0–4.0)

## 2023-03-16 LAB — TESTOSTERONE, FREE, TOTAL, SHBG
Sex Hormone Binding: 21.4 nmol/L (ref 19.3–76.4)
Testosterone, Free: 2.6 pg/mL — ABNORMAL LOW (ref 7.2–24.0)
Testosterone: 76 ng/dL — ABNORMAL LOW (ref 264–916)

## 2023-03-18 ENCOUNTER — Telehealth: Payer: Self-pay | Admitting: Family Medicine

## 2023-03-18 NOTE — Telephone Encounter (Signed)
Please find out what they need

## 2023-03-18 NOTE — Telephone Encounter (Signed)
Received a fax clarification for patient's prescription of Testosterone Gel. Spoke with Dr Laural Benes to clarify and spoke with patient's pharmacy to clarify. Prescription instructions should be "apply 10 g onto skin daily." Pharmacist verbalized understanding and has no further questions at this time.

## 2023-03-18 NOTE — Telephone Encounter (Signed)
Copied from CRM [id number]. Topic: General - Other >> Mar 18, 2023 11:31 AM Carrielelia G wrote: Reason for CRM: patient spoke with pharmacy, pharmacy stated they needed clarification on medication instructions: testosterone (ANDROGEL) 50 MG/5GM (1%) GEL [[id number]]

## 2023-03-20 ENCOUNTER — Other Ambulatory Visit: Payer: Self-pay | Admitting: Nurse Practitioner

## 2023-03-20 DIAGNOSIS — N63 Unspecified lump in unspecified breast: Secondary | ICD-10-CM

## 2023-03-20 DIAGNOSIS — R928 Other abnormal and inconclusive findings on diagnostic imaging of breast: Secondary | ICD-10-CM

## 2023-03-25 ENCOUNTER — Telehealth: Payer: Self-pay | Admitting: Family Medicine

## 2023-03-25 NOTE — Telephone Encounter (Signed)
Patient came into office requesting if Dr. Laural Benes would verify and/or updated directions for Testosterone 50mg . He says he can not get it [facility] because the directions says apply (1) 10mg  packet onto the skin daily and that it does not come in 10mg  only 5mg  so he need to use (2) 5mg  packet. Informed patient I would send a message back to his provider. Please advise and contact patient [phone].

## 2023-03-25 NOTE — Telephone Encounter (Signed)
Spoke with [facility] local [facility] and informed them that we have in documented in patient's chart under a telephone encounter on 03/18/23 at 4:39 PM to clarify patient's Testosterone prescription instructions. Pharmacist says he did not see where it was documented in patient's profile. Pharmacist says they will get it taken care of for the patient and contact them when it is ready for pick up. Patient was notified and verbalized understanding. Apology was given to patient for the miscommunication.

## 2023-04-05 ENCOUNTER — Encounter: Payer: Self-pay | Admitting: Urology

## 2023-04-05 ENCOUNTER — Other Ambulatory Visit: Payer: Managed Care, Other (non HMO)

## 2023-04-09 LAB — HM DIABETES EYE EXAM

## 2023-06-11 ENCOUNTER — Ambulatory Visit: Payer: Managed Care, Other (non HMO) | Admitting: Family Medicine

## 2023-06-13 ENCOUNTER — Other Ambulatory Visit: Payer: Self-pay | Admitting: Family Medicine

## 2023-06-13 DIAGNOSIS — I10 Essential (primary) hypertension: Secondary | ICD-10-CM

## 2023-06-14 NOTE — Telephone Encounter (Signed)
Unable to refill per protocol, Rx request is too soon last refill 03/11/23 for 90 and 1 refill.  Requested Prescriptions  Pending Prescriptions Disp Refills   benazepril (LOTENSIN) 40 MG tablet [Pharmacy Med Name: BENAZEPRIL HCL 40 MG TABLET] 90 tablet 1    Sig: TAKE 1 TABLET BY MOUTH EVERY DAY     Cardiovascular:  ACE Inhibitors Passed - 06/13/2023  4:17 PM      Passed - Cr in normal range and within 180 days    Creatinine  Date Value Ref Range Status  11/28/2012 0.98 0.60 - 1.30 mg/dL Final   Creatinine, Ser  Date Value Ref Range Status  03/11/2023 1.00 0.76 - 1.27 mg/dL Final         Passed - K in normal range and within 180 days    Potassium  Date Value Ref Range Status  03/11/2023 4.1 3.5 - 5.2 mmol/L Final  11/28/2012 3.9 3.5 - 5.1 mmol/L Final         Passed - Patient is not pregnant      Passed - Last BP in normal range    BP Readings from Last 1 Encounters:  03/11/23 128/79         Passed - Valid encounter within last 6 months    Recent Outpatient Visits           3 months ago Hypogonadism in male   [place] [facility] [place], Megan P, DO   5 months ago Mass of upper outer quadrant of right breast   [facility] [place], Corrie Dandy T, NP   10 months ago Diarrhea, unspecified type   [place] [facility] Larae Grooms, NP   11 months ago Routine general medical examination at a health care facility   [facility] [place] P, DO   1 year ago Type 2 diabetes mellitus with stage 2 chronic kidney disease, without long-term current use of insulin (HCC)   [place] [facility] [place], Oralia Rud, DO       Future Appointments             In 3 days Laural Benes, Oralia Rud, DO [place] [facility], [facility]   In 8 months Stoioff, Verna Czech, MD [facility] [place]             hydrochlorothiazide (HYDRODIURIL) 25 MG tablet [Pharmacy Med Name:  HYDROCHLOROTHIAZIDE 25 MG TAB] 90 tablet 1    Sig: TAKE 1 TABLET (25 MG TOTAL) BY MOUTH DAILY.     Cardiovascular: Diuretics - Thiazide Passed - 06/13/2023  4:17 PM      Passed - Cr in normal range and within 180 days    Creatinine  Date Value Ref Range Status  11/28/2012 0.98 0.60 - 1.30 mg/dL Final   Creatinine, Ser  Date Value Ref Range Status  03/11/2023 1.00 0.76 - 1.27 mg/dL Final         Passed - K in normal range and within 180 days    Potassium  Date Value Ref Range Status  03/11/2023 4.1 3.5 - 5.2 mmol/L Final  11/28/2012 3.9 3.5 - 5.1 mmol/L Final         Passed - Na in normal range and within 180 days    Sodium  Date Value Ref Range Status  03/11/2023 140 134 - 144 mmol/L Final  11/28/2012 137 136 - 145 mmol/L Final         Passed -  Last BP in normal range    BP Readings from Last 1 Encounters:  03/11/23 128/79         Passed - Valid encounter within last 6 months    Recent Outpatient Visits           3 months ago Hypogonadism in male   [place] [facility] [place], Megan P, DO   5 months ago Mass of upper outer quadrant of right breast   [place] [place], Corrie Dandy T, NP   10 months ago Diarrhea, unspecified type   [facility] Larae Grooms, NP   11 months ago Routine general medical examination at a health care facility   [facility] [place] P, DO   1 year ago Type 2 diabetes mellitus with stage 2 chronic kidney disease, without long-term current use of insulin (HCC)   [place] [place], Oralia Rud, DO       Future Appointments             In 3 days Laural Benes, Oralia Rud, DO [place] [facility], [facility]   In 8 months Stoioff, Verna Czech, MD [facility] [place]             venlafaxine XR (EFFEXOR-XR) 75 MG 24 hr capsule [Pharmacy Med Name: VENLAFAXINE HCL ER 75 MG CAP] 90 capsule 1    Sig: TAKE 1 CAPSULE  (75 MG TOTAL) BY MOUTH DAILY WITH BREAKFAST. TO BE TAKEN WITH THE 150MG  FOR 225MG  TOTAL     Psychiatry: Antidepressants - SNRI - desvenlafaxine & venlafaxine Failed - 06/13/2023  4:17 PM      Failed - Lipid Panel in normal range within the last 12 months    Cholesterol, Total  Date Value Ref Range Status  03/11/2023 238 (H) 100 - 199 mg/dL Final   Cholesterol Piccolo, Waived  Date Value Ref Range Status  05/29/2018 214 (H) <200 mg/dL Final    Comment:                            Desirable                <200                         Borderline High      200- 239                         High                     >239    LDL Chol Calc (NIH)  Date Value Ref Range Status  03/11/2023 146 (H) 0 - 99 mg/dL Final   HDL  Date Value Ref Range Status  03/11/2023 55 >39 mg/dL Final   Triglycerides  Date Value Ref Range Status  03/11/2023 206 (H) 0 - 149 mg/dL Final   Triglycerides Piccolo,Waived  Date Value Ref Range Status  05/29/2018 253 (H) <150 mg/dL Final    Comment:                            Normal                   <150  Borderline High     150 - 199                         High                200 - 499                         Very High                >499          Passed - Cr in normal range and within 360 days    Creatinine  Date Value Ref Range Status  11/28/2012 0.98 0.60 - 1.30 mg/dL Final   Creatinine, Ser  Date Value Ref Range Status  03/11/2023 1.00 0.76 - 1.27 mg/dL Final         Passed - Completed PHQ-2 or PHQ-9 in the last 360 days      Passed - Last BP in normal range    BP Readings from Last 1 Encounters:  03/11/23 128/79         Passed - Valid encounter within last 6 months    Recent Outpatient Visits           3 months ago Hypogonadism in male   [place] [facility] [place], Megan P, DO   5 months ago Mass of upper outer quadrant of right breast   [facility] [place], Corrie Dandy T, NP    10 months ago Diarrhea, unspecified type   [place] [facility] Larae Grooms, NP   11 months ago Routine general medical examination at a health care facility   [facility] [place] P, DO   1 year ago Type 2 diabetes mellitus with stage 2 chronic kidney disease, without long-term current use of insulin (HCC)   [place] [facility] [place], Oralia Rud, DO       Future Appointments             In 3 days Laural Benes, Oralia Rud, DO [facility]   In 8 months Stoioff, Verna Czech, MD [facility] [place]             amLODipine (NORVASC) 10 MG tablet [Pharmacy Med Name: AMLODIPINE BESYLATE 10 MG TAB] 90 tablet 1    Sig: TAKE 1 TABLET BY MOUTH EVERY DAY     Cardiovascular: Calcium Channel Blockers 2 Passed - 06/13/2023  4:17 PM      Passed - Last BP in normal range    BP Readings from Last 1 Encounters:  03/11/23 128/79         Passed - Last Heart Rate in normal range    Pulse Readings from Last 1 Encounters:  03/11/23 81         Passed - Valid encounter within last 6 months    Recent Outpatient Visits           3 months ago Hypogonadism in male   [place] [facility] [place], Megan P, DO   5 months ago Mass of upper outer quadrant of right breast   [place] [facility] [place], Corrie Dandy T, NP   10 months ago Diarrhea, unspecified type   [facility] Larae Grooms, NP   11 months ago Routine general medical examination at a health care facility  [place] [facility] [place], [place] P, DO   1 year ago Type 2 diabetes mellitus with stage 2 chronic kidney disease, without long-term current use of insulin (HCC)   [facility] [place], Oralia Rud, DO       Future Appointments             In 3 days Laural Benes, Oralia Rud, DO [facility] [place], [facility]   In 8 months Stoioff,  Verna Czech, MD [facility]

## 2023-06-17 ENCOUNTER — Ambulatory Visit: Payer: Managed Care, Other (non HMO) | Admitting: Family Medicine

## 2023-06-17 ENCOUNTER — Encounter: Payer: Self-pay | Admitting: Family Medicine

## 2023-06-17 VITALS — BP 154/90 | HR 96 | Temp 98.2°F | Wt 207.8 lb

## 2023-06-17 DIAGNOSIS — E291 Testicular hypofunction: Secondary | ICD-10-CM | POA: Diagnosis not present

## 2023-06-17 DIAGNOSIS — N182 Chronic kidney disease, stage 2 (mild): Secondary | ICD-10-CM | POA: Diagnosis not present

## 2023-06-17 DIAGNOSIS — Z7984 Long term (current) use of oral hypoglycemic drugs: Secondary | ICD-10-CM

## 2023-06-17 DIAGNOSIS — I10 Essential (primary) hypertension: Secondary | ICD-10-CM

## 2023-06-17 DIAGNOSIS — E1122 Type 2 diabetes mellitus with diabetic chronic kidney disease: Secondary | ICD-10-CM | POA: Diagnosis not present

## 2023-06-17 LAB — BAYER DCA HB A1C WAIVED: HB A1C (BAYER DCA - WAIVED): 8.9 % — ABNORMAL HIGH (ref 4.8–5.6)

## 2023-06-17 MED ORDER — TESTOSTERONE 50 MG/5GM (1%) TD GEL: 10.0000 g | Freq: Every day | TRANSDERMAL | 1 refills | Status: DC

## 2023-06-17 MED ORDER — METFORMIN HCL ER 500 MG PO TB24: 1000.0000 mg | ORAL_TABLET | Freq: Two times a day (BID) | ORAL | 3 refills | Status: DC

## 2023-06-17 MED ORDER — VENLAFAXINE HCL ER 150 MG PO CP24: 150.0000 mg | ORAL_CAPSULE | Freq: Every day | ORAL | Status: DC

## 2023-06-17 NOTE — Assessment & Plan Note (Signed)
Feeling better. Rechecking labs today. Await results.

## 2023-06-17 NOTE — Assessment & Plan Note (Signed)
Missed a couple of days of his medicine. Just restarted it. Recheck next visit.

## 2023-06-17 NOTE — Progress Notes (Signed)
BP (!) 154/90   Pulse 96   Temp 98.2 F (36.8 C) (Oral)   Wt 207 lb 12.8 oz (94.3 kg)   SpO2 97%   BMI 30.69 kg/m    Subjective:    Patient ID: Jesse Dunlap, male    DOB: June 11, 1966, 57 y.o.   MRN: [id number]  HPI: Jesse Dunlap is a 57 y.o. male  Chief Complaint  Patient presents with   Diabetes    Will request most recent eye exam   DIABETES Hypoglycemic episodes:no Polydipsia/polyuria: no Visual disturbance: no Chest pain: no Paresthesias: no Glucose Monitoring: no  Accucheck frequency: Not Checking Taking Insulin?: no Blood Pressure Monitoring: not checking Retinal Examination: Up to Date Foot Exam: Up to Date Diabetic Education: Completed Pneumovax: Up to Date Influenza: Not up to Date Aspirin: no  LOW TESTOSTERONE Duration: chronic Status: better  Satisfied with current treatment:  yes Previous testosterone therapies: androgel Medication side effects:  no Medication compliance: excellent compliance Decreased libido: no Fatigue: no Depressed mood: no Muscle weakness: no Erectile dysfunction: no  Relevant past medical, surgical, family and social history reviewed and updated as indicated. Interim medical history since our last visit reviewed. Allergies and medications reviewed and updated.  Review of Systems  Constitutional: Negative.   Respiratory: Negative.    Cardiovascular: Negative.   Gastrointestinal: Negative.   Musculoskeletal: Negative.   Neurological: Negative.   Psychiatric/Behavioral: Negative.      Per HPI unless specifically indicated above     Objective:    BP (!) 154/90   Pulse 96   Temp 98.2 F (36.8 C) (Oral)   Wt 207 lb 12.8 oz (94.3 kg)   SpO2 97%   BMI 30.69 kg/m   Wt Readings from Last 3 Encounters:  06/17/23 207 lb 12.8 oz (94.3 kg)  03/11/23 210 lb 12.8 oz (95.6 kg)  02/18/23 210 lb (95.3 kg)    Physical Exam Vitals and nursing note reviewed.  Constitutional:      General: He is not in acute  distress.    Appearance: Normal appearance. He is obese. He is not ill-appearing, toxic-appearing or diaphoretic.  HENT:     Head: Normocephalic and atraumatic.     Right Ear: External ear normal.     Left Ear: External ear normal.     Nose: Nose normal.     Mouth/Throat:     Mouth: Mucous membranes are moist.     Pharynx: Oropharynx is clear.  Eyes:     General: No scleral icterus.       Right eye: No discharge.        Left eye: No discharge.     Extraocular Movements: Extraocular movements intact.     Conjunctiva/sclera: Conjunctivae normal.     Pupils: Pupils are equal, round, and reactive to light.  Cardiovascular:     Rate and Rhythm: Normal rate and regular rhythm.     Pulses: Normal pulses.     Heart sounds: Normal heart sounds. No murmur heard.    No friction rub. No gallop.  Pulmonary:     Effort: Pulmonary effort is normal. No respiratory distress.     Breath sounds: Normal breath sounds. No stridor. No wheezing, rhonchi or rales.  Chest:     Chest wall: No tenderness.  Musculoskeletal:        General: Normal range of motion.     Cervical back: Normal range of motion and neck supple.  Skin:    General: Skin is warm and dry.  Capillary Refill: Capillary refill takes less than 2 seconds.     Coloration: Skin is not jaundiced or pale.     Findings: No bruising, erythema, lesion or rash.  Neurological:     General: No focal deficit present.     Mental Status: He is alert and oriented to person, place, and time. Mental status is at baseline.  Psychiatric:        Mood and Affect: Mood normal.        Behavior: Behavior normal.        Thought Content: Thought content normal.        Judgment: Judgment normal.     Results for orders placed or performed in visit on 03/13/23  HM DIABETES EYE EXAM  Result Value Ref Range   HM Diabetic Eye Exam No Retinopathy No Retinopathy      Assessment & Plan:   Problem List Items Addressed This Visit       Cardiovascular  and Mediastinum   Hypertension    Missed a couple of days of his medicine. Just restarted it. Recheck next visit.        Endocrine   Type 2 diabetes mellitus with stage 2 chronic kidney disease, without long-term current use of insulin (HCC) - Primary    Still not doing well with A1c of 8.9. Will start metformin and recheck in about 6 weeks. Call with any concerns.       Relevant Medications   metFORMIN (GLUCOPHAGE-XR) 500 MG 24 hr tablet   Other Relevant Orders   Bayer DCA Hb A1c Waived   Hypogonadism in male    Feeling better. Rechecking labs today. Await results.       Relevant Orders   Testosterone, free, total([facility])     Follow up plan: Return in about 6 weeks (around 07/29/2023).

## 2023-06-17 NOTE — Assessment & Plan Note (Signed)
Still not doing well with A1c of 8.9. Will start metformin and recheck in about 6 weeks. Call with any concerns.

## 2023-06-18 ENCOUNTER — Telehealth: Payer: Self-pay | Admitting: Family Medicine

## 2023-06-18 NOTE — Telephone Encounter (Signed)
Patient called stated pharmacy said provider needs to get approval from insurance to fill the script. Patient stated he is going out of town on Thursday morning and will need his medication before he leaves.

## 2023-06-18 NOTE — Telephone Encounter (Signed)
testosterone (ANDROGEL) 50 MG/5GM (1%) GEL

## 2023-06-19 ENCOUNTER — Telehealth: Payer: Self-pay

## 2023-06-24 NOTE — Telephone Encounter (Signed)
Pt calling to following up on PA for his  testosterone (ANDROGEL) 50 MG/5GM (1%) GEL  [facility]  He said he tried to get before he left.   [facility] [address] [place], [place] [address] Phone: [phone]

## 2023-06-26 ENCOUNTER — Encounter: Payer: Self-pay | Admitting: Family Medicine

## 2023-06-26 NOTE — Telephone Encounter (Signed)
First prior authorization was denied called and spoke with insurance company today to initiate a verbal prior authorization [phone]

## 2023-06-26 NOTE — Telephone Encounter (Signed)
Please see patient chart

## 2023-06-28 ENCOUNTER — Telehealth: Payer: Self-pay | Admitting: Family Medicine

## 2023-06-28 NOTE — Telephone Encounter (Signed)
Lisa from [employer] called back with correct cb number of [phone]

## 2023-06-28 NOTE — Telephone Encounter (Signed)
Copied from CRM [id number]. Topic: General - Other >> Jun 28, 2023 11:24 AM Turkey B wrote: Reason for CRM: LIsa from [facility] called in about status of form sent for PA. She said she spoke with someone at practice on 08/21. They are waiting for return of form for testostorone transdermal gel.

## 2023-07-01 NOTE — Telephone Encounter (Signed)
Copied from CRM [id number]. Topic: General - Other >> Jul 01, 2023  4:01 PM Phill Myron wrote: Attn: Andre Lefort, CMA      medication:  testosterone (ANDROGEL) 50 MG/5GM (1%) GEL  Please call [place] with [facility]    [phone]  ref# [phone]  or fax [phone] for completed documents   Regarding the status of the documents sent over

## 2023-07-02 ENCOUNTER — Telehealth: Payer: Self-pay | Admitting: Family Medicine

## 2023-07-02 NOTE — Telephone Encounter (Signed)
Copied from CRM [id number]. Topic: General - Other >> Jul 01, 2023  3:38 PM Everette C wrote: Reason for CRM: The patient has called to follow up on a previous request for completion of their prior authorization   Please contact further when possible

## 2023-07-04 NOTE — Telephone Encounter (Signed)
Pt is calling back regarding his Prior Authorization for his medication. Pt has talked to the pharmacy and they are still waiting on his PA to be sent back to them.    Please advise: [place] with [facility]    [phone]  ref# [phone]  or fax [phone] for completed documents.   testosterone (ANDROGEL) 50 MG/5GM (1%) GEL

## 2023-07-05 NOTE — Telephone Encounter (Signed)
Duplicate please see patient chart

## 2023-07-05 NOTE — Telephone Encounter (Signed)
 Duplicate please see pt chart.

## 2023-07-09 NOTE — Telephone Encounter (Signed)
Copied from CRM [id number]. Topic: General - Other >> Jul 09, 2023  2:25 PM Everette C wrote: Reason for CRM: The patient has called to follow up on a prior authorization request for their testosterone (ANDROGEL) 50 MG/5GM (1%) GEL [[id number]]  The patient has called to request that the prior authorization stating that the authorization is urgent   The patient has [facility]  Member ID [id number] Group ID [id number] Pharmacy [phone]  Please contact the patient further when possible

## 2023-07-10 NOTE — Telephone Encounter (Signed)
Provider signature is needed before faxing documents back.

## 2023-07-15 ENCOUNTER — Telehealth: Payer: Self-pay | Admitting: Family Medicine

## 2023-07-15 NOTE — Telephone Encounter (Signed)
Prior authorization faxed back today awaiting response.

## 2023-07-15 NOTE — Telephone Encounter (Signed)
Copied from CRM [id number]. Topic: General - Other >> Jul 15, 2023  1:11 PM Phill Myron wrote: Please call regarding status of  testosterone (ANDROGEL) 50 MG/5GM (1%) GEL   (new rx needed, mark urgent, leaving town next )  Mr. Strouse would like a call back  Dr Laural Benes or MA Harrison Mons.  The patient has [facility]  Member ID [id number]  Group ID [id number]  Pharmacy [phone]

## 2023-07-16 NOTE — Telephone Encounter (Signed)
Prior authorization was approved. Called and informed patient, Pt voiced understanding

## 2023-07-29 ENCOUNTER — Ambulatory Visit (INDEPENDENT_AMBULATORY_CARE_PROVIDER_SITE_OTHER): Payer: Managed Care, Other (non HMO) | Admitting: Family Medicine

## 2023-07-29 ENCOUNTER — Encounter: Payer: Self-pay | Admitting: Family Medicine

## 2023-07-29 VITALS — BP 129/87 | HR 93 | Ht 69.0 in | Wt 203.0 lb

## 2023-07-29 DIAGNOSIS — N182 Chronic kidney disease, stage 2 (mild): Secondary | ICD-10-CM | POA: Diagnosis not present

## 2023-07-29 DIAGNOSIS — E1122 Type 2 diabetes mellitus with diabetic chronic kidney disease: Secondary | ICD-10-CM

## 2023-07-29 DIAGNOSIS — E291 Testicular hypofunction: Secondary | ICD-10-CM | POA: Diagnosis not present

## 2023-07-29 DIAGNOSIS — Z7984 Long term (current) use of oral hypoglycemic drugs: Secondary | ICD-10-CM

## 2023-07-29 DIAGNOSIS — I10 Essential (primary) hypertension: Secondary | ICD-10-CM

## 2023-07-29 MED ORDER — TESTOSTERONE 1.62 % TD GEL: 1.0000 | Freq: Every day | TRANSDERMAL | 5 refills | Status: DC

## 2023-07-29 NOTE — Assessment & Plan Note (Signed)
Has been unable to get his testosterone due to cost. Different formulation about $41 with good rx- coupon given today and referral to pharmacy placed. Call with any concerns. Recheck after being on the new med for about a month.

## 2023-07-29 NOTE — Assessment & Plan Note (Signed)
Doing well. Continue current regimen. Call with any concerns.

## 2023-07-29 NOTE — Progress Notes (Signed)
BP 129/87   Pulse 93   Ht 5\' 9"  (1.753 m)   Wt 203 lb (92.1 kg)   SpO2 98%   BMI 29.98 kg/m    Subjective:    Patient ID: Jesse Dunlap, male    DOB: 1966-01-31, 57 y.o.   MRN: [id number]  HPI: Jesse Dunlap is a 57 y.o. male  Chief Complaint  Patient presents with   Diabetes   Hypertension   Hypogonadism   Medication Problem    Patient says he has not been able to get his prescription for Testosterone and says that even with the approval, it still cost him over $300. Patient says his insurance is now applying the cost to his deductible.    DIABETES Hypoglycemic episodes:no Polydipsia/polyuria: no Visual disturbance: no Chest pain: no Paresthesias: no Glucose Monitoring: yes Taking Insulin?: no Blood Pressure Monitoring: rarely Retinal Examination: Up to Date Foot Exam: Up to Date Diabetic Education: Completed Pneumovax: Up to Date Influenza: Not up to Date Aspirin: yes  HYPERTENSION  Hypertension status: controlled  Satisfied with current treatment? yes Duration of hypertension: chronic BP monitoring frequency:  not checking BP medication side effects:  no Medication compliance: excellent compliance Previous BP meds:amlodipine, benazepril, hydrochlorothiazide,  Aspirin: yes Recurrent headaches: no Visual changes: no Palpitations: no Dyspnea: no Chest pain: no Lower extremity edema: no Dizzy/lightheaded: no  LOW TESTOSTERONE Duration: chronic Status: uncontrolled  Satisfied with current treatment:  no Previous testosterone therapies: androgel Medication side effects:  no Medication compliance: has not been able to get due to cost Decreased libido: yes Fatigue: yes Depressed mood: yes Muscle weakness: yes Erectile dysfunction: yes   Relevant past medical, surgical, family and social history reviewed and updated as indicated. Interim medical history since our last visit reviewed. Allergies and medications reviewed and updated.  Review of  Systems  Constitutional: Negative.   Respiratory: Negative.    Cardiovascular: Negative.   Gastrointestinal: Negative.   Musculoskeletal:  Positive for myalgias. Negative for arthralgias, back pain, gait problem, joint swelling, neck pain and neck stiffness.  Skin: Negative.   Psychiatric/Behavioral: Negative.      Per HPI unless specifically indicated above     Objective:    BP 129/87   Pulse 93   Ht 5\' 9"  (1.753 m)   Wt 203 lb (92.1 kg)   SpO2 98%   BMI 29.98 kg/m   Wt Readings from Last 3 Encounters:  07/29/23 203 lb (92.1 kg)  06/17/23 207 lb 12.8 oz (94.3 kg)  03/11/23 210 lb 12.8 oz (95.6 kg)    Physical Exam Vitals and nursing note reviewed.  Constitutional:      General: He is not in acute distress.    Appearance: Normal appearance. He is not ill-appearing, toxic-appearing or diaphoretic.  HENT:     Head: Normocephalic and atraumatic.     Right Ear: External ear normal.     Left Ear: External ear normal.     Nose: Nose normal.     Mouth/Throat:     Mouth: Mucous membranes are moist.     Pharynx: Oropharynx is clear.  Eyes:     General: No scleral icterus.       Right eye: No discharge.        Left eye: No discharge.     Extraocular Movements: Extraocular movements intact.     Conjunctiva/sclera: Conjunctivae normal.     Pupils: Pupils are equal, round, and reactive to light.  Cardiovascular:     Rate and Rhythm: Normal  rate and regular rhythm.     Pulses: Normal pulses.     Heart sounds: Normal heart sounds. No murmur heard.    No friction rub. No gallop.  Pulmonary:     Effort: Pulmonary effort is normal. No respiratory distress.     Breath sounds: Normal breath sounds. No stridor. No wheezing, rhonchi or rales.  Chest:     Chest wall: No tenderness.  Musculoskeletal:        General: Normal range of motion.     Cervical back: Normal range of motion and neck supple.  Skin:    General: Skin is warm and dry.     Capillary Refill: Capillary refill  takes less than 2 seconds.     Coloration: Skin is not jaundiced or pale.     Findings: No bruising, erythema, lesion or rash.  Neurological:     General: No focal deficit present.     Mental Status: He is alert and oriented to person, place, and time. Mental status is at baseline.  Psychiatric:        Mood and Affect: Mood normal.        Behavior: Behavior normal.        Thought Content: Thought content normal.        Judgment: Judgment normal.     Results for orders placed or performed in visit on 06/26/23  HM DIABETES EYE EXAM  Result Value Ref Range   HM Diabetic Eye Exam No Retinopathy No Retinopathy      Assessment & Plan:   Problem List Items Addressed This Visit       Cardiovascular and Mediastinum   Hypertension    Doing well. Continue current regimen. Call with any concerns.         Endocrine   Type 2 diabetes mellitus with stage 2 chronic kidney disease, without long-term current use of insulin (HCC)    Rechecking labs today. Await results. Treat as needed.       Relevant Orders   Hgb A1c w/o eAG   Hypogonadism in male - Primary    Has been unable to get his testosterone due to cost. Different formulation about $41 with good rx- coupon given today and referral to pharmacy placed. Call with any concerns. Recheck after being on the new med for about a month.       Relevant Orders   AMB Referral to Pharmacy Medication Management   Testosterone, free, total([facility])     Follow up plan: Return in about 3 months (around 10/28/2023).

## 2023-07-29 NOTE — Assessment & Plan Note (Signed)
Rechecking labs today. Await results. Treat as needed.  °

## 2023-07-30 LAB — HGB A1C W/O EAG: Hgb A1c MFr Bld: 8.7 % — ABNORMAL HIGH (ref 4.8–5.6)

## 2023-08-01 ENCOUNTER — Telehealth: Payer: Self-pay

## 2023-08-01 NOTE — Progress Notes (Signed)
Care Guide Note  08/01/2023 Name: Jesse Dunlap MRN: [id number] DOB: 12/10/1965  Referred by: Dorcas Carrow, DO Reason for referral : Care Coordination (Outreach to schedule with Pharm d )   Jesse Dunlap is a 56 y.o. year old male who is a primary care patient of Dorcas Carrow, DO. Jesse Dunlap was referred to the pharmacist for assistance related to  Med assistance  .    An unsuccessful telephone outreach was attempted today to contact the patient who was referred to the pharmacy team for assistance with medication assistance. Additional attempts will be made to contact the patient.   Penne Lash, RMA Care Guide [facility]  [place], [place] [address] Direct Dial: [phone] [email or web address]

## 2023-08-12 ENCOUNTER — Other Ambulatory Visit: Payer: Managed Care, Other (non HMO)

## 2023-08-12 NOTE — Progress Notes (Signed)
08/12/2023  Patient ID: Jesse Dunlap, male   DOB: [birth year], 57 y.o.   MRN: [id number]  S/O Patient outreach to assist with affordability of Testosterone  Medication access/adherence -Reviewed medication list to verify it is accurate and up to date -Patient states his insurance recently changed, and his copay for testosterone was going to be $350 -Based on the copay, he was without the medication for several weeks.  8/13 Testosterone levels were WNL, but this was likely prior to patient going without the medication. -Dr. Laural Benes sent in a new prescription for testosterone 1.62% pump with directions to apply 1 pump daily, and patient states this went through for $0 copay; so he now has medication on hand and is using -Patient does not endorse any additional barriers to adherence to medication regimen  DM -Current medications:  metformin xr 1000mg  BID -Patient does not check home BG, but most recent A1c 8.7% on 9/23  HLD/ASCVD Risk Reduction -Current lipid-lowering medications:  None -Current antiplatelet:  ASA 81mg  daily -Chart notes reflect patient previously taking atorvastatin 40, but he does not recall ever taking -Lipid panel 03/11/23:  TC 238, TG 206, LDL 146, HDL 55  A/P  Medication access/adherence -All medications currently affordable for patient; he will contact if this were to change -Patient has appointment scheduled for lab work 10/15; he will have been back on testosterone therapy 3+ weeks at this point.  I recommend evaluation to determine if dosing needs to be altered once result received.  DM -Currently uncontrolled -Patient would likely benefit from additional pharmacotherapy in attempt to lower A1c to <7% -Could consider SGLT2 or GLP1 if no CI's present- verify no history of pancreatitis nor personal/family history of medullary thyroid cancer for GLP1's- verify eGFR  >/=45 for SGLT2 therapy -If medication added, I recommend f/u A1c and CMP in 12  weeks  HLD -Currently uncontrolled -Recommend initiation of high intensity statin, such as atorvastatin 40mg  daily with follow-up lipid panel and LFT's in 12 weeks -ALT mildly elevated at 53; if this were to double, I recommend stopping therapy until return to baseline.    Follow-up:  None scheduled but can as recommended per PCP  Lenna Gilford, PharmD, DPLA

## 2023-08-20 ENCOUNTER — Other Ambulatory Visit: Payer: Managed Care, Other (non HMO)

## 2023-08-20 DIAGNOSIS — E291 Testicular hypofunction: Secondary | ICD-10-CM

## 2023-08-23 ENCOUNTER — Other Ambulatory Visit: Payer: Self-pay | Admitting: Family Medicine

## 2023-08-23 DIAGNOSIS — E291 Testicular hypofunction: Secondary | ICD-10-CM

## 2023-08-23 LAB — TESTOSTERONE, FREE, TOTAL, SHBG
Sex Hormone Binding: 25.8 nmol/L (ref 19.3–76.4)
Testosterone, Free: 8.9 pg/mL (ref 7.2–24.0)
Testosterone: 194 ng/dL — ABNORMAL LOW (ref 264–916)

## 2023-08-23 MED ORDER — TESTOSTERONE 1.62 % TD GEL: 2.0000 | Freq: Every day | TRANSDERMAL | 1 refills | Status: DC

## 2023-09-08 ENCOUNTER — Other Ambulatory Visit: Payer: Self-pay | Admitting: Family Medicine

## 2023-09-09 NOTE — Telephone Encounter (Signed)
Requested Prescriptions  Pending Prescriptions Disp Refills   venlafaxine XR (EFFEXOR-XR) 150 MG 24 hr capsule [Pharmacy Med Name: VENLAFAXINE HCL ER 150 MG CAP] 90 capsule 1    Sig: TAKE 1 CAPSULE BY MOUTH ONCE DAILY WITH BREAKFAST AS DIRECTED ALONG WITH 75MG  = 225MG      Psychiatry: Antidepressants - SNRI - desvenlafaxine & venlafaxine Failed - 09/08/2023  8:49 AM      Failed - Lipid Panel in normal range within the last 12 months    Cholesterol, Total  Date Value Ref Range Status  03/11/2023 238 (H) 100 - 199 mg/dL Final   Cholesterol Piccolo, Waived  Date Value Ref Range Status  05/29/2018 214 (H) <200 mg/dL Final    Comment:                            Desirable                <200                         Borderline High      200- 239                         High                     >239    LDL Chol Calc (NIH)  Date Value Ref Range Status  03/11/2023 146 (H) 0 - 99 mg/dL Final   HDL  Date Value Ref Range Status  03/11/2023 55 >39 mg/dL Final   Triglycerides  Date Value Ref Range Status  03/11/2023 206 (H) 0 - 149 mg/dL Final   Triglycerides Piccolo,Waived  Date Value Ref Range Status  05/29/2018 253 (H) <150 mg/dL Final    Comment:                            Normal                   <150                         Borderline High     150 - 199                         High                200 - 499                         Very High                >499          Passed - Cr in normal range and within 360 days    Creatinine  Date Value Ref Range Status  11/28/2012 0.98 0.60 - 1.30 mg/dL Final   Creatinine, Ser  Date Value Ref Range Status  03/11/2023 1.00 0.76 - 1.27 mg/dL Final         Passed - Completed PHQ-2 or PHQ-9 in the last 360 days      Passed - Last BP in normal range    BP Readings from Last 1 Encounters:  07/29/23 129/87         Passed - Valid encounter  within last 6 months    Recent Outpatient Visits           1 month ago Hypogonadism in male    [place] [facility] [place], [place] P, DO   2 months ago Type 2 diabetes mellitus with stage 2 chronic kidney disease, without long-term current use of insulin (HCC)   [place] [facility] [place], [place], DO   6 months ago Hypogonadism in male   [place] [facility] [place], Megan P, DO   8 months ago Mass of upper outer quadrant of right breast   [place] [facility] Aura Dials T, NP   1 year ago Diarrhea, unspecified type   [place] [facility] Larae Grooms, NP       Future Appointments             In 1 month Johnson, Oralia Rud, DO  [facility]   In 5 months Stoioff, Verna Czech, MD [facility]

## 2023-09-13 ENCOUNTER — Other Ambulatory Visit: Payer: Self-pay | Admitting: Family Medicine

## 2023-09-13 DIAGNOSIS — I10 Essential (primary) hypertension: Secondary | ICD-10-CM

## 2023-09-13 NOTE — Telephone Encounter (Signed)
Medication Refill -  Most Recent Primary Care Visit:  Provider: [facility]  Department[facility]  Visit Type: LAB  Date: 08/20/2023  Medication: benazepril (LOTENSIN) 40 MG tablet [[id number]]   hydrochlorothiazide (HYDRODIURIL) 25 MG tablet   amLODipine (NORVASC) 10 MG tablet [[id number]]      Has the patient contacted their pharmacy? Yes (Agent: If no, request that the patient contact the pharmacy for the refill. If patient does not wish to contact the pharmacy document the reason why and proceed with request.) (Agent: If yes, when and what did the pharmacy advise?)  Is this the correct pharmacy for this prescription? Yes If no, delete pharmacy and type the correct one.  This is the patient's preferred pharmacy: [id number] - [place], [place] - [address] [[id number]]   Has the prescription been filled recently? No  Is the patient out of the medication? NO  Has the patient been seen for an appointment in the last year OR does the patient have an upcoming appointment? Yes  Can we respond through MyChart? No  Agent: Please be advised that Rx refills may take up to 3 business days. We ask that you follow-up with your pharmacy.

## 2023-09-13 NOTE — Telephone Encounter (Signed)
Medication Refill -  Most Recent Primary Care Visit:  Provider: [facility]  Department: [facility]  Visit Type: LAB  Date: 08/20/2023  Medication: Testosterone 1.62EL [[id number]]  Patient nev/er pick [facility] that was [facility] 08/23/2023  Has the patient contacted their pharmacy? No (Agent: If no, request that the patient contact the pharmacy for the refill. If patient does not wish to contact the pharmacy document the reason why and proceed with request.) (Agent: If yes, when and what did the pharmacy advise?)  Is this the correct pharmacy for this prescription? Yes If no, delete pharmacy and type the correct one.  This is the patient's preferred pharmacy:  [facility] [id number] [place], [place] - [address] [place] [address] Phone: [phone] Fax: [phone]   Has the prescription been filled recently? Yes  Is the patient out of the medication? No  Has the patient been seen for an appointment in the last year OR does the patient have an upcoming appointment? Yes  Can we respond through MyChart? No  Agent: Please be advised that Rx refills may take up to 3 business days. We ask that you follow-up with your pharmacy.

## 2023-09-13 NOTE — Telephone Encounter (Signed)
Requested medication (s) are due for refill today - no  Requested medication (s) are on the active medication list -yes  Future visit scheduled -yes  Last refill: 08/23/23 150g 1RF  Notes to clinic: off protocol- provider review- possible change in pharmacy   Requested Prescriptions  Pending Prescriptions Disp Refills   Testosterone 1.62 % GEL 150 g 1    Sig: Place 2 Pump onto the skin daily.     Off-Protocol Failed - 09/13/2023 11:13 AM      Failed - Medication not assigned to a protocol, review manually.      Passed - Valid encounter within last 12 months    Recent Outpatient Visits           1 month ago Hypogonadism in male   [place] [facility] [place], Megan P, DO   2 months ago Type 2 diabetes mellitus with stage 2 chronic kidney disease, without long-term current use of insulin (HCC)   [place] [place], [place], DO   6 months ago Hypogonadism in male   [place] [facility] [place], Megan P, DO   8 months ago Mass of upper outer quadrant of right breast   [place] [facility] [place], Corrie Dandy T, NP   1 year ago Diarrhea, unspecified type   [place] [facility] Larae Grooms, NP       Future Appointments             In 1 month Laural Benes, Oralia Rud, DO [place] [facility]   In 5 months Stoioff, Verna Czech, MD [facility] [place]               Requested Prescriptions  Pending Prescriptions Disp Refills   Testosterone 1.62 % GEL 150 g 1    Sig: Place 2 Pump onto the skin daily.     Off-Protocol Failed - 09/13/2023 11:13 AM      Failed - Medication not assigned to a protocol, review manually.      Passed - Valid encounter within last 12 months    Recent Outpatient Visits           1 month ago Hypogonadism in male   [place] [facility] [place], Megan P, DO   2 months ago Type 2 diabetes mellitus with stage 2 chronic  kidney disease, without long-term current use of insulin (HCC)   [place] [facility] [place], [place], DO   6 months ago Hypogonadism in male   [place] [facility] [place], Megan P, DO   8 months ago Mass of upper outer quadrant of right breast   [place] [facility] Aura Dials T, NP   1 year ago Diarrhea, unspecified type   [place] [facility] Larae Grooms, NP       Future Appointments             In 1 month Johnson, Oralia Rud, DO [place] [facility]   In 5 months Stoioff, Verna Czech, MD [facility]

## 2023-09-16 MED ORDER — HYDROCHLOROTHIAZIDE 25 MG PO TABS: 25.0000 mg | ORAL_TABLET | Freq: Every day | ORAL | 0 refills | Status: DC

## 2023-09-16 MED ORDER — TESTOSTERONE 1.62 % TD GEL: 2.0000 | Freq: Every day | TRANSDERMAL | 0 refills | Status: DC

## 2023-09-16 MED ORDER — AMLODIPINE BESYLATE 10 MG PO TABS: 10.0000 mg | ORAL_TABLET | Freq: Every day | ORAL | 0 refills | Status: DC

## 2023-09-16 MED ORDER — BENAZEPRIL HCL 40 MG PO TABS
40.0000 mg | ORAL_TABLET | Freq: Every day | ORAL | 0 refills | Status: DC
Start: 2023-09-16 — End: 2023-12-12

## 2023-09-16 NOTE — Telephone Encounter (Signed)
Requested medication (s) are due for refill today:   Yes for all 3  Requested medication (s) are on the active medication list:   Yes for all 3  Future visit scheduled:   Yes 12/23   Last ordered: All 3 on 03/11/2023 #90, 1 refill each  Unable to refill because labs are due per protocol.     Requested Prescriptions  Pending Prescriptions Disp Refills   benazepril (LOTENSIN) 40 MG tablet 90 tablet 1    Sig: Take 1 tablet (40 mg total) by mouth daily.     Cardiovascular:  ACE Inhibitors Failed - 09/13/2023 11:11 AM      Failed - Cr in normal range and within 180 days    Creatinine  Date Value Ref Range Status  11/28/2012 0.98 0.60 - 1.30 mg/dL Final   Creatinine, Ser  Date Value Ref Range Status  03/11/2023 1.00 0.76 - 1.27 mg/dL Final         Failed - K in normal range and within 180 days    Potassium  Date Value Ref Range Status  03/11/2023 4.1 3.5 - 5.2 mmol/L Final  11/28/2012 3.9 3.5 - 5.1 mmol/L Final         Passed - Patient is not pregnant      Passed - Last BP in normal range    BP Readings from Last 1 Encounters:  07/29/23 129/87         Passed - Valid encounter within last 6 months    Recent Outpatient Visits           1 month ago Hypogonadism in male   [place] [facility] [place], Megan P, DO   3 months ago Type 2 diabetes mellitus with stage 2 chronic kidney disease, without long-term current use of insulin (HCC)   [place] [facility] [place], [place], DO   6 months ago Hypogonadism in male   [place] [facility] [place], [place], DO   9 months ago Mass of upper outer quadrant of right breast   [place] [facility] [place], Corrie Dandy T, NP   1 year ago Diarrhea, unspecified type   [place] [facility] Larae Grooms, NP       Future Appointments             In 1 month Laural Benes, Oralia Rud, DO  [facility], [facility]   In 5 months Stoioff, Verna Czech, MD  [facility] [place]             hydrochlorothiazide (HYDRODIURIL) 25 MG tablet 90 tablet 1    Sig: Take 1 tablet (25 mg total) by mouth daily.     Cardiovascular: Diuretics - Thiazide Failed - 09/13/2023 11:11 AM      Failed - Cr in normal range and within 180 days    Creatinine  Date Value Ref Range Status  11/28/2012 0.98 0.60 - 1.30 mg/dL Final   Creatinine, Ser  Date Value Ref Range Status  03/11/2023 1.00 0.76 - 1.27 mg/dL Final         Failed - K in normal range and within 180 days    Potassium  Date Value Ref Range Status  03/11/2023 4.1 3.5 - 5.2 mmol/L Final  11/28/2012 3.9 3.5 - 5.1 mmol/L Final         Failed - Na in normal range and within 180 days    Sodium  Date Value Ref Range Status  03/11/2023 140 134 - 144 mmol/L Final  11/28/2012 137 136 - 145 mmol/L Final         Passed - Last BP in normal range    BP Readings from Last 1 Encounters:  07/29/23 129/87         Passed - Valid encounter within last 6 months    Recent Outpatient Visits           1 month ago Hypogonadism in male   [place] [place], Megan P, DO   3 months ago Type 2 diabetes mellitus with stage 2 chronic kidney disease, without long-term current use of insulin (HCC)   [place] [facility] [place], [place], DO   6 months ago Hypogonadism in male   [place] [facility] [place], Megan P, DO   9 months ago Mass of upper outer quadrant of right breast   [place] [facility] [place], Corrie Dandy T, NP   1 year ago Diarrhea, unspecified type   [place] Larae Grooms, NP       Future Appointments             In 1 month Laural Benes, Oralia Rud, DO [place] [facility], [facility]   In 5 months Stoioff, Verna Czech, MD [facility] [place]             amLODipine (NORVASC) 10 MG tablet 90 tablet 1    Sig: Take 1 tablet (10 mg total) by mouth daily.      Cardiovascular: Calcium Channel Blockers 2 Passed - 09/13/2023 11:11 AM      Passed - Last BP in normal range    BP Readings from Last 1 Encounters:  07/29/23 129/87         Passed - Last Heart Rate in normal range    Pulse Readings from Last 1 Encounters:  07/29/23 93         Passed - Valid encounter within last 6 months    Recent Outpatient Visits           1 month ago Hypogonadism in male   [place] [facility] [place], Megan P, DO   3 months ago Type 2 diabetes mellitus with stage 2 chronic kidney disease, without long-term current use of insulin (HCC)   [place] [facility] [place], [place], DO   6 months ago Hypogonadism in male   [place] [facility] [place], Megan P, DO   9 months ago Mass of upper outer quadrant of right breast   [place] [facility] [place], Corrie Dandy T, NP   1 year ago Diarrhea, unspecified type   [place] [facility] Larae Grooms, NP       Future Appointments             In 1 month Johnson, Oralia Rud, DO [facility], [facility]   In 5 months Stoioff, Verna Czech, MD [facility] [place]

## 2023-09-23 ENCOUNTER — Encounter: Payer: Self-pay | Admitting: Family Medicine

## 2023-09-26 ENCOUNTER — Other Ambulatory Visit: Payer: Managed Care, Other (non HMO)

## 2023-09-26 DIAGNOSIS — E291 Testicular hypofunction: Secondary | ICD-10-CM

## 2023-09-29 LAB — TESTOSTERONE, FREE, TOTAL, SHBG
Sex Hormone Binding: 21.5 nmol/L (ref 19.3–76.4)
Testosterone, Free: 9.1 pg/mL (ref 7.2–24.0)
Testosterone: 215 ng/dL — ABNORMAL LOW (ref 264–916)

## 2023-10-02 ENCOUNTER — Other Ambulatory Visit: Payer: Self-pay | Admitting: Family Medicine

## 2023-10-02 DIAGNOSIS — E291 Testicular hypofunction: Secondary | ICD-10-CM

## 2023-10-02 MED ORDER — TESTOSTERONE 1.62 % TD GEL: 3.0000 | Freq: Every day | TRANSDERMAL | 1 refills | Status: DC

## 2023-10-12 ENCOUNTER — Other Ambulatory Visit: Payer: Self-pay | Admitting: Family Medicine

## 2023-10-14 NOTE — Telephone Encounter (Signed)
Requested Prescriptions  Pending Prescriptions Disp Refills   metFORMIN (GLUCOPHAGE-XR) 500 MG 24 hr tablet [Pharmacy Med Name: METFORMIN HCL ER 500 MG TABLET] 360 tablet 0    Sig: TAKE 2 TABLETS BY MOUTH TWICE A DAY WITH A MEAL     Endocrinology:  Diabetes - Biguanides Failed - 10/12/2023  6:51 AM      Failed - HBA1C is between 0 and 7.9 and within 180 days    Hemoglobin A1C  Date Value Ref Range Status  11/28/2012 6.1 4.2 - 6.3 % Final    Comment:    The American Diabetes Association recommends that a primary goal of therapy should be <7% and that physicians should reevaluate the treatment regimen in patients with HbA1c values consistently >8%.    HB A1C (BAYER DCA - WAIVED)  Date Value Ref Range Status  06/17/2023 8.9 (H) 4.8 - 5.6 % Final    Comment:             Prediabetes: 5.7 - 6.4          Diabetes: >6.4          Glycemic control for adults with diabetes: <7.0    Hgb A1c MFr Bld  Date Value Ref Range Status  07/29/2023 8.7 (H) 4.8 - 5.6 % Final    Comment:             Prediabetes: 5.7 - 6.4          Diabetes: >6.4          Glycemic control for adults with diabetes: <7.0          Failed - B12 Level in normal range and within 720 days    No results found for: "VITAMINB12"       Passed - Cr in normal range and within 360 days    Creatinine  Date Value Ref Range Status  11/28/2012 0.98 0.60 - 1.30 mg/dL Final   Creatinine, Ser  Date Value Ref Range Status  03/11/2023 1.00 0.76 - 1.27 mg/dL Final         Passed - eGFR in normal range and within 360 days    EGFR (African American)  Date Value Ref Range Status  11/28/2012 >60  Final   GFR calc Af Amer  Date Value Ref Range Status  12/27/2020 104 >59 mL/min/1.73 Final    Comment:    **In accordance with recommendations from the NKF-ASN Task force,**   [facility] is in the process of updating its eGFR calculation to the   2021 CKD-EPI creatinine equation that estimates kidney function   without a race  variable.    EGFR (Non-African Amer.)  Date Value Ref Range Status  11/28/2012 >60  Final    Comment:    eGFR values <36mL/min/1.73 m2 may be an indication of chronic kidney disease (CKD). Calculated eGFR is useful in patients with stable renal function. The eGFR calculation will not be reliable in acutely ill patients when serum creatinine is changing rapidly. It is not useful in  patients on dialysis. The eGFR calculation may not be applicable to patients at the low and high extremes of body sizes, pregnant women, and vegetarians.    GFR calc non Af Amer  Date Value Ref Range Status  12/27/2020 90 >59 mL/min/1.73 Final   eGFR  Date Value Ref Range Status  03/11/2023 87 >59 mL/min/1.73 Final         Passed - Valid encounter within last 6 months    Recent  Outpatient Visits           2 months ago Hypogonadism in male   [facility] [place], Megan P, DO   3 months ago Type 2 diabetes mellitus with stage 2 chronic kidney disease, without long-term current use of insulin (HCC)   [place] [facility] [place], Megan P, DO   7 months ago Hypogonadism in male   [place] [facility] [place], Megan P, DO   10 months ago Mass of upper outer quadrant of right breast   [place] [facility] [place], Corrie Dandy T, NP   1 year ago Diarrhea, unspecified type   [place] [facility] Larae Grooms, NP       Future Appointments             In 1 week Laural Benes, Oralia Rud, DO [place] [place], [facility]   In 4 months Stoioff, Verna Czech, MD [facility] [place]            Passed - CBC within normal limits and completed in the last 12 months    WBC  Date Value Ref Range Status  03/11/2023 3.7 3.4 - 10.8 x10E3/uL Final  11/28/2012 8.4 3.8 - 10.6 x10 3/mm 3 Final   RBC  Date Value Ref Range Status  03/11/2023 4.96 4.14 - 5.80 x10E6/uL Final  11/28/2012 5.64 4.40 - 5.90 x10 6/mm  3 Final   Hemoglobin  Date Value Ref Range Status  03/11/2023 16.2 13.0 - 17.7 g/dL Final   Hematocrit  Date Value Ref Range Status  03/11/2023 47.5 37.5 - 51.0 % Final   MCHC  Date Value Ref Range Status  03/11/2023 34.1 31.5 - 35.7 g/dL Final  16/08/9603 54.0 32.0 - 36.0 g/dL Final   [facility]  Date Value Ref Range Status  03/11/2023 32.7 26.6 - 33.0 pg Final  11/28/2012 30.4 26.0 - 34.0 pg Final   MCV  Date Value Ref Range Status  03/11/2023 96 79 - 97 fL Final  11/28/2012 86 80 - 100 fL Final   No results found for: "PLTCOUNTKUC", "LABPLAT", "POCPLA" RDW  Date Value Ref Range Status  03/11/2023 12.2 11.6 - 15.4 % Final  11/28/2012 13.8 11.5 - 14.5 % Final

## 2023-10-25 ENCOUNTER — Encounter: Payer: Self-pay | Admitting: Family Medicine

## 2023-10-25 ENCOUNTER — Ambulatory Visit (INDEPENDENT_AMBULATORY_CARE_PROVIDER_SITE_OTHER): Payer: Managed Care, Other (non HMO) | Admitting: Family Medicine

## 2023-10-25 VITALS — BP 138/84 | HR 90 | Wt 206.0 lb

## 2023-10-25 DIAGNOSIS — E1122 Type 2 diabetes mellitus with diabetic chronic kidney disease: Secondary | ICD-10-CM | POA: Diagnosis not present

## 2023-10-25 DIAGNOSIS — N182 Chronic kidney disease, stage 2 (mild): Secondary | ICD-10-CM | POA: Diagnosis not present

## 2023-10-25 DIAGNOSIS — Z7985 Long-term (current) use of injectable non-insulin antidiabetic drugs: Secondary | ICD-10-CM

## 2023-10-25 DIAGNOSIS — R972 Elevated prostate specific antigen [PSA]: Secondary | ICD-10-CM

## 2023-10-25 DIAGNOSIS — E291 Testicular hypofunction: Secondary | ICD-10-CM | POA: Diagnosis not present

## 2023-10-25 MED ORDER — TIRZEPATIDE 2.5 MG/0.5ML ~~LOC~~ SOAJ: 2.5000 mg | SUBCUTANEOUS | Status: DC

## 2023-10-25 MED ORDER — METFORMIN HCL ER 500 MG PO TB24: 1000.0000 mg | ORAL_TABLET | Freq: Every day | ORAL | 0 refills | Status: DC

## 2023-10-25 MED ORDER — TIRZEPATIDE 5 MG/0.5ML ~~LOC~~ SOAJ: 5.0000 mg | SUBCUTANEOUS | 0 refills | Status: DC

## 2023-10-25 NOTE — Assessment & Plan Note (Signed)
Rechecking labs today. Await results.  

## 2023-10-25 NOTE — Progress Notes (Signed)
BP 138/84   Pulse 90   Wt 206 lb (93.4 kg)   SpO2 97%   BMI 30.42 kg/m    Subjective:    Patient ID: Jesse Dunlap, male    DOB: 05/31/1966, 57 y.o.   MRN: [id number]  HPI: Jesse Dunlap is a 57 y.o. male  Chief Complaint  Patient presents with   Diabetes   DIABETES- has been missing his PM dose about 1/2 the time.  Hypoglycemic episodes:no Polydipsia/polyuria: no Visual disturbance: no Chest pain: no Paresthesias: no Glucose Monitoring: yes  Accucheck frequency: occasionally Taking Insulin?: no Blood Pressure Monitoring: not checking Retinal Examination: Up to Date Foot Exam: Up to Date Diabetic Education: Completed Pneumovax: Up to Date Influenza: Up to Date Aspirin: yes  LOW TESTOSTERONE Duration: chronic Status: controlled  Satisfied with current treatment:  yes Previous testosterone therapies: androgel Medication side effects:  no Medication compliance: excellent compliance Decreased libido: yes Fatigue: yes Depressed mood: yes Muscle weakness: yes Erectile dysfunction: no   Relevant past medical, surgical, family and social history reviewed and updated as indicated. Interim medical history since our last visit reviewed. Allergies and medications reviewed and updated.  Review of Systems  Constitutional: Negative.   Respiratory: Negative.    Cardiovascular: Negative.   Musculoskeletal: Negative.   Neurological: Negative.   Psychiatric/Behavioral: Negative.      Per HPI unless specifically indicated above     Objective:    BP 138/84   Pulse 90   Wt 206 lb (93.4 kg)   SpO2 97%   BMI 30.42 kg/m   Wt Readings from Last 3 Encounters:  10/25/23 206 lb (93.4 kg)  07/29/23 203 lb (92.1 kg)  06/17/23 207 lb 12.8 oz (94.3 kg)    Physical Exam Vitals and nursing note reviewed.  Constitutional:      General: He is not in acute distress.    Appearance: Normal appearance. He is not ill-appearing, toxic-appearing or diaphoretic.  HENT:      Head: Normocephalic and atraumatic.     Right Ear: External ear normal.     Left Ear: External ear normal.     Nose: Nose normal.     Mouth/Throat:     Mouth: Mucous membranes are moist.     Pharynx: Oropharynx is clear.  Eyes:     General: No scleral icterus.       Right eye: No discharge.        Left eye: No discharge.     Extraocular Movements: Extraocular movements intact.     Conjunctiva/sclera: Conjunctivae normal.     Pupils: Pupils are equal, round, and reactive to light.  Cardiovascular:     Rate and Rhythm: Normal rate and regular rhythm.     Pulses: Normal pulses.     Heart sounds: Normal heart sounds. No murmur heard.    No friction rub. No gallop.  Pulmonary:     Effort: Pulmonary effort is normal. No respiratory distress.     Breath sounds: Normal breath sounds. No stridor. No wheezing, rhonchi or rales.  Chest:     Chest wall: No tenderness.  Musculoskeletal:        General: Normal range of motion.     Cervical back: Normal range of motion and neck supple.  Skin:    General: Skin is warm and dry.     Capillary Refill: Capillary refill takes less than 2 seconds.     Coloration: Skin is not jaundiced or pale.     Findings: No  bruising, erythema, lesion or rash.  Neurological:     General: No focal deficit present.     Mental Status: He is alert and oriented to person, place, and time. Mental status is at baseline.  Psychiatric:        Mood and Affect: Mood normal.        Behavior: Behavior normal.        Thought Content: Thought content normal.        Judgment: Judgment normal.     Results for orders placed or performed in visit on 09/26/23  Testosterone, free, total([facility])   Collection Time: 09/26/23  8:57 AM  Result Value Ref Range   Testosterone 215 (L) 264 - 916 ng/dL   Testosterone, Free 9.1 7.2 - 24.0 pg/mL   Sex Hormone Binding 21.5 19.3 - 76.4 nmol/L      Assessment & Plan:   Problem List Items Addressed This Visit        Endocrine   Type 2 diabetes mellitus with stage 2 chronic kidney disease, without long-term current use of insulin (HCC) - Primary   Hemoglobin high today, unable to read A1c- will send out. Await results. Missing most of his PM doses. Will change to [facility] for ease of dosing. Decrease metformin to 1000mg  QAM and start mounjaro- sample given. Recheck 3 months at physical.      Relevant Medications   tirzepatide (MOUNJARO) 2.5 MG/0.5ML Pen   tirzepatide (MOUNJARO) 5 MG/0.5ML Pen   metFORMIN (GLUCOPHAGE-XR) 500 MG 24 hr tablet   Other Relevant Orders   Bayer DCA Hb A1c Waived   Hgb A1c w/o eAG   Hypogonadism in male   Under good control on current regimen. Continue current regimen. Continue to monitor. Call with any concerns. Labs drawn today.       Relevant Orders   Hgb A1c w/o eAG   CBC with Differential/Platelet     Other   Elevated PSA   Rechecking labs today. Await results.       Relevant Orders   PSA     Follow up plan: Return in about 3 months (around 01/23/2024) for physical.

## 2023-10-25 NOTE — Assessment & Plan Note (Signed)
Under good control on current regimen. Continue current regimen. Continue to monitor. Call with any concerns. Labs drawn today.  

## 2023-10-25 NOTE — Assessment & Plan Note (Signed)
Hemoglobin high today, unable to read A1c- will send out. Await results. Missing most of his PM doses. Will change to [facility] for ease of dosing. Decrease metformin to 1000mg  QAM and start mounjaro- sample given. Recheck 3 months at physical.

## 2023-10-27 LAB — CBC WITH DIFFERENTIAL/PLATELET
Basophils Absolute: 0 10*3/uL (ref 0.0–0.2)
Basos: 1 %
EOS (ABSOLUTE): 0.1 10*3/uL (ref 0.0–0.4)
Eos: 2 %
Hematocrit: 46.9 % (ref 37.5–51.0)
Hemoglobin: 16.2 g/dL (ref 13.0–17.7)
Immature Grans (Abs): 0 10*3/uL (ref 0.0–0.1)
Immature Granulocytes: 0 %
Lymphocytes Absolute: 1.5 10*3/uL (ref 0.7–3.1)
Lymphs: 25 %
MCH: 33.6 pg — ABNORMAL HIGH (ref 26.6–33.0)
MCHC: 34.5 g/dL (ref 31.5–35.7)
MCV: 97 fL (ref 79–97)
Monocytes Absolute: 0.5 10*3/uL (ref 0.1–0.9)
Monocytes: 9 %
Neutrophils Absolute: 3.6 10*3/uL (ref 1.4–7.0)
Neutrophils: 63 %
Platelets: 209 10*3/uL (ref 150–450)
RBC: 4.82 x10E6/uL (ref 4.14–5.80)
RDW: 12.3 % (ref 11.6–15.4)
WBC: 5.8 10*3/uL (ref 3.4–10.8)

## 2023-10-27 LAB — HGB A1C W/O EAG: Hgb A1c MFr Bld: 7.2 % — ABNORMAL HIGH (ref 4.8–5.6)

## 2023-10-27 LAB — TESTOSTERONE, FREE, TOTAL, SHBG
Sex Hormone Binding: 23.6 nmol/L (ref 19.3–76.4)
Testosterone, Free: 10.7 pg/mL (ref 7.2–24.0)
Testosterone: 255 ng/dL — ABNORMAL LOW (ref 264–916)

## 2023-10-27 LAB — PSA: Prostate Specific Ag, Serum: 6.9 ng/mL — ABNORMAL HIGH (ref 0.0–4.0)

## 2023-10-28 ENCOUNTER — Ambulatory Visit: Payer: Managed Care, Other (non HMO) | Admitting: Family Medicine

## 2023-10-31 ENCOUNTER — Other Ambulatory Visit: Payer: Self-pay | Admitting: Family Medicine

## 2023-10-31 DIAGNOSIS — R972 Elevated prostate specific antigen [PSA]: Secondary | ICD-10-CM

## 2023-11-13 ENCOUNTER — Other Ambulatory Visit: Payer: Managed Care, Other (non HMO)

## 2023-11-13 ENCOUNTER — Other Ambulatory Visit: Payer: Self-pay

## 2023-11-13 DIAGNOSIS — R972 Elevated prostate specific antigen [PSA]: Secondary | ICD-10-CM

## 2023-11-15 ENCOUNTER — Telehealth: Payer: Self-pay | Admitting: Family Medicine

## 2023-11-15 NOTE — Telephone Encounter (Signed)
 Copied from CRM [id number]. Topic: General - Other >> Nov 15, 2023  9:10 AM Macon Large wrote: Reason for CRM: Pt called for an update on the prior authorization for tirzepatide [facility]) 5 MG/0.5ML Pen.

## 2023-11-15 NOTE — Telephone Encounter (Signed)
 Patient was notified that Prior Authorization was initiated via [employer] website. Patient was notified that it has been initiated and once we received an outcome, someone from the clinical staff will reach out and let him know.

## 2023-11-19 LAB — PHI SCORE REFLEX
% Free PSA: 21.8 %
PSA, Free: 1.42 ng/mL
Prostate Heath Index Score: 36.4
p2PSA: 20.3 pg/mL

## 2023-11-19 LAB — PROSTATE HEALTH INDEX: Prostate Specific Ag: 6.5 ng/mL — ABNORMAL HIGH (ref 0.0–3.9)

## 2023-11-25 ENCOUNTER — Encounter: Payer: Self-pay | Admitting: Family Medicine

## 2023-11-27 ENCOUNTER — Telehealth: Payer: Self-pay

## 2023-11-27 NOTE — Telephone Encounter (Signed)
-----   Message from Olevia Perches sent at 11/26/2023  2:37 PM EST ----- We do have another mounjaro sample, so if they can't fill it today, we can give him one.

## 2023-11-27 NOTE — Telephone Encounter (Signed)
Spoke with Darl Pikes from [facility] pharmacy to follow up with patient's [facility] prescription. Pharmacist says she was able to get an override from the patient's insurance. They order the medication for the patient and it should be ready for pick up and they will notify the patient when it is ready.

## 2023-12-11 ENCOUNTER — Other Ambulatory Visit: Payer: Self-pay | Admitting: Family Medicine

## 2023-12-11 DIAGNOSIS — I10 Essential (primary) hypertension: Secondary | ICD-10-CM

## 2023-12-12 NOTE — Telephone Encounter (Signed)
 Requested Prescriptions  Pending Prescriptions Disp Refills   benazepril  (LOTENSIN ) 40 MG tablet [Pharmacy Med Name: BENAZEPRIL  HCL 40 MG TABLET] 90 tablet 0    Sig: TAKE 1 TABLET BY MOUTH DAILY     Cardiovascular:  ACE Inhibitors Failed - 12/12/2023  1:07 PM      Failed - Cr in normal range and within 180 days    Creatinine  Date Value Ref Range Status  11/28/2012 0.98 0.60 - 1.30 mg/dL Final   Creatinine, Ser  Date Value Ref Range Status  03/11/2023 1.00 0.76 - 1.27 mg/dL Final         Failed - K in normal range and within 180 days    Potassium  Date Value Ref Range Status  03/11/2023 4.1 3.5 - 5.2 mmol/L Final  11/28/2012 3.9 3.5 - 5.1 mmol/L Final         Passed - Patient is not pregnant      Passed - Last BP in normal range    BP Readings from Last 1 Encounters:  10/25/23 138/84         Passed - Valid encounter within last 6 months    Recent Outpatient Visits           1 month ago Type 2 diabetes mellitus with stage 2 chronic kidney disease, without long-term current use of insulin (HCC)   [place] [facility] [place], Megan P, DO   4 months ago Hypogonadism in male   [place] [facility] [place], Megan P, DO   5 months ago Type 2 diabetes mellitus with stage 2 chronic kidney disease, without long-term current use of insulin (HCC)   [place] [facility] [place], [place], DO   9 months ago Hypogonadism in male   [place] [facility] [place], [place], DO   11 months ago Mass of upper outer quadrant of right breast   [place] [facility] [place], Melanie T, NP       Future Appointments             In 1 month Johnson, [place], DO [place] [facility], [facility]   In 2 months Stoioff, Glendia BROCKS, MD [facility] [place]             amLODipine  (NORVASC ) 10 MG tablet [Pharmacy Med Name: amLODIPine  BESYLATE 10MG  TAB] 90 tablet 0    Sig: TAKE 1 TABLET BY MOUTH DAILY      Cardiovascular: Calcium Channel Blockers 2 Passed - 12/12/2023  1:07 PM      Passed - Last BP in normal range    BP Readings from Last 1 Encounters:  10/25/23 138/84         Passed - Last Heart Rate in normal range    Pulse Readings from Last 1 Encounters:  10/25/23 90         Passed - Valid encounter within last 6 months    Recent Outpatient Visits           1 month ago Type 2 diabetes mellitus with stage 2 chronic kidney disease, without long-term current use of insulin (HCC)   [place] [facility] [place], Megan P, DO   4 months ago Hypogonadism in male   [place] [facility] [place], Megan P, DO   5 months ago Type 2 diabetes mellitus with stage 2 chronic kidney disease, without long-term current use of insulin (HCC)   [place] [facility] [place], Megan P, DO   9 months ago Hypogonadism in male   [place] [facility] [place], [place], DO   11 months ago Mass of upper outer quadrant of right breast   [place] [facility] [place], Melanie T, NP       Future Appointments             In 1 month Johnson, Duwaine SQUIBB, DO [place] [facility], [facility]   In 2 months Stoioff, Glendia BROCKS, MD [facility] [place]             hydrochlorothiazide  (HYDRODIURIL ) 25 MG tablet [Pharmacy Med Name: hydroCHLOROthiazide  25 MG TABLET] 90 tablet 0    Sig: TAKE 1 TABLET BY MOUTH DAILY     Cardiovascular: Diuretics - Thiazide Failed - 12/12/2023  1:07 PM      Failed - Cr in normal range and within 180 days    Creatinine  Date Value Ref Range Status  11/28/2012 0.98 0.60 - 1.30 mg/dL Final   Creatinine, Ser  Date Value Ref Range Status  03/11/2023 1.00 0.76 - 1.27 mg/dL Final         Failed - K in normal range and within 180 days    Potassium  Date Value Ref Range Status  03/11/2023 4.1 3.5 - 5.2 mmol/L Final  11/28/2012 3.9 3.5 - 5.1 mmol/L Final         Failed - Na in normal range  and within 180 days    Sodium  Date Value Ref Range Status  03/11/2023 140 134 - 144 mmol/L Final  11/28/2012 137 136 - 145 mmol/L Final         Passed - Last BP in normal range    BP Readings from Last 1 Encounters:  10/25/23 138/84         Passed - Valid encounter within last 6 months    Recent Outpatient Visits           1 month ago Type 2 diabetes mellitus with stage 2 chronic kidney disease, without long-term current use of insulin (HCC)   [place] [facility] [place], Megan P, DO   4 months ago Hypogonadism in male   [place] [facility] [place], Megan P, DO   5 months ago Type 2 diabetes mellitus with stage 2 chronic kidney disease, without long-term current use of insulin (HCC)   [place] [facility] [place], [place], DO   9 months ago Hypogonadism in male   [place] [facility] [place], Mazie, DO   11 months ago Mass of upper outer quadrant of right breast   [place] [facility] [place], Melanie DASEN, NP       Future Appointments             In 1 month Johnson, Duwaine SQUIBB, DO  [facility], [facility]   In 2 months Stoioff, Glendia BROCKS, MD [facility] [place]

## 2024-01-10 ENCOUNTER — Other Ambulatory Visit: Payer: Self-pay | Admitting: Family Medicine

## 2024-01-10 NOTE — Telephone Encounter (Signed)
 Requested Prescriptions  Pending Prescriptions Disp Refills   metFORMIN (GLUCOPHAGE-XR) 500 MG 24 hr tablet [Pharmacy Med Name: METFORMIN HCL ER 500 MG TABLET] 360 tablet 0    Sig: TAKE 2 TABLETS BY MOUTH TWICE A DAY WITH A MEAL     Endocrinology:  Diabetes - Biguanides Failed - 01/10/2024  1:46 PM      Failed - B12 Level in normal range and within 720 days    No results found for: "VITAMINB12"       Passed - Cr in normal range and within 360 days    Creatinine  Date Value Ref Range Status  11/28/2012 0.98 0.60 - 1.30 mg/dL Final   Creatinine, Ser  Date Value Ref Range Status  03/11/2023 1.00 0.76 - 1.27 mg/dL Final         Passed - HBA1C is between 0 and 7.9 and within 180 days    Hemoglobin A1C  Date Value Ref Range Status  11/28/2012 6.1 4.2 - 6.3 % Final    Comment:    The American Diabetes Association recommends that a primary goal of therapy should be <7% and that physicians should reevaluate the treatment regimen in patients with HbA1c values consistently >8%.    HB A1C (BAYER DCA - WAIVED)  Date Value Ref Range Status  06/17/2023 8.9 (H) 4.8 - 5.6 % Final    Comment:             Prediabetes: 5.7 - 6.4          Diabetes: >6.4          Glycemic control for adults with diabetes: <7.0    Hgb A1c MFr Bld  Date Value Ref Range Status  10/25/2023 7.2 (H) 4.8 - 5.6 % Final    Comment:             Prediabetes: 5.7 - 6.4          Diabetes: >6.4          Glycemic control for adults with diabetes: <7.0          Passed - eGFR in normal range and within 360 days    EGFR (African American)  Date Value Ref Range Status  11/28/2012 >60  Final   GFR calc Af Amer  Date Value Ref Range Status  12/27/2020 104 >59 mL/min/1.73 Final    Comment:    **In accordance with recommendations from the NKF-ASN Task force,**   [facility] is in the process of updating its eGFR calculation to the   2021 CKD-EPI creatinine equation that estimates kidney function   without a race  variable.    EGFR (Non-African Amer.)  Date Value Ref Range Status  11/28/2012 >60  Final    Comment:    eGFR values <33mL/min/1.73 m2 may be an indication of chronic kidney disease (CKD). Calculated eGFR is useful in patients with stable renal function. The eGFR calculation will not be reliable in acutely ill patients when serum creatinine is changing rapidly. It is not useful in  patients on dialysis. The eGFR calculation may not be applicable to patients at the low and high extremes of body sizes, pregnant women, and vegetarians.    GFR calc non Af Amer  Date Value Ref Range Status  12/27/2020 90 >59 mL/min/1.73 Final   eGFR  Date Value Ref Range Status  03/11/2023 87 >59 mL/min/1.73 Final         Passed - Valid encounter within last 6 months    Recent  Outpatient Visits           2 months ago Type 2 diabetes mellitus with stage 2 chronic kidney disease, without long-term current use of insulin (HCC)   [place] [facility] [place], Megan P, DO   5 months ago Hypogonadism in male   [place] [facility] [place], Megan P, DO   6 months ago Type 2 diabetes mellitus with stage 2 chronic kidney disease, without long-term current use of insulin (HCC)   [place] [facility] [place], Megan P, DO   10 months ago Hypogonadism in male   [place] [facility] [place], [place], DO   1 year ago Mass of upper outer quadrant of right breast   [place] [facility] [place], Corrie Dandy T, NP       Future Appointments             In 1 week Dorcas Carrow, DO [place] [facility], [facility]   In 1 month Stoioff, Verna Czech, MD [facility] [place]            Passed - CBC within normal limits and completed in the last 12 months    WBC  Date Value Ref Range Status  10/25/2023 5.8 3.4 - 10.8 x10E3/uL Final  11/28/2012 8.4 3.8 - 10.6 x10 3/mm 3 Final   RBC  Date Value Ref Range Status   10/25/2023 4.82 4.14 - 5.80 x10E6/uL Final  11/28/2012 5.64 4.40 - 5.90 x10 6/mm 3 Final   Hemoglobin  Date Value Ref Range Status  10/25/2023 16.2 13.0 - 17.7 g/dL Final   Hematocrit  Date Value Ref Range Status  10/25/2023 46.9 37.5 - 51.0 % Final   MCHC  Date Value Ref Range Status  10/25/2023 34.5 31.5 - 35.7 g/dL Final  60/45/4098 11.9 32.0 - 36.0 g/dL Final   [facility]  Date Value Ref Range Status  10/25/2023 33.6 (H) 26.6 - 33.0 pg Final  11/28/2012 30.4 26.0 - 34.0 pg Final   MCV  Date Value Ref Range Status  10/25/2023 97 79 - 97 fL Final  11/28/2012 86 80 - 100 fL Final   No results found for: "PLTCOUNTKUC", "LABPLAT", "POCPLA" RDW  Date Value Ref Range Status  10/25/2023 12.3 11.6 - 15.4 % Final  11/28/2012 13.8 11.5 - 14.5 % Final

## 2024-01-23 ENCOUNTER — Ambulatory Visit (INDEPENDENT_AMBULATORY_CARE_PROVIDER_SITE_OTHER): Payer: Managed Care, Other (non HMO) | Admitting: Family Medicine

## 2024-01-23 ENCOUNTER — Encounter: Payer: Self-pay | Admitting: Family Medicine

## 2024-01-23 VITALS — BP 133/85 | HR 99 | Temp 98.6°F | Resp 16 | Ht 69.02 in | Wt 193.0 lb

## 2024-01-23 DIAGNOSIS — I10 Essential (primary) hypertension: Secondary | ICD-10-CM

## 2024-01-23 DIAGNOSIS — R3911 Hesitancy of micturition: Secondary | ICD-10-CM

## 2024-01-23 DIAGNOSIS — M545 Low back pain, unspecified: Secondary | ICD-10-CM

## 2024-01-23 DIAGNOSIS — N182 Chronic kidney disease, stage 2 (mild): Secondary | ICD-10-CM

## 2024-01-23 DIAGNOSIS — E291 Testicular hypofunction: Secondary | ICD-10-CM

## 2024-01-23 DIAGNOSIS — E1122 Type 2 diabetes mellitus with diabetic chronic kidney disease: Secondary | ICD-10-CM

## 2024-01-23 DIAGNOSIS — E7800 Pure hypercholesterolemia, unspecified: Secondary | ICD-10-CM

## 2024-01-23 DIAGNOSIS — Z0000 Encounter for general adult medical examination without abnormal findings: Secondary | ICD-10-CM

## 2024-01-23 DIAGNOSIS — Z7984 Long term (current) use of oral hypoglycemic drugs: Secondary | ICD-10-CM

## 2024-01-23 DIAGNOSIS — N401 Enlarged prostate with lower urinary tract symptoms: Secondary | ICD-10-CM | POA: Diagnosis not present

## 2024-01-23 LAB — MICROALBUMIN, URINE WAIVED
Creatinine, Urine Waived: 300 mg/dL (ref 10–300)
Microalb, Ur Waived: 80 mg/L — ABNORMAL HIGH (ref 0–19)

## 2024-01-23 LAB — BAYER DCA HB A1C WAIVED: HB A1C (BAYER DCA - WAIVED): 6.4 % — ABNORMAL HIGH (ref 4.8–5.6)

## 2024-01-23 MED ORDER — CYCLOBENZAPRINE HCL 10 MG PO TABS: 5.0000 mg | ORAL_TABLET | Freq: Three times a day (TID) | ORAL | 1 refills | Status: DC | PRN

## 2024-01-23 NOTE — Assessment & Plan Note (Signed)
 Rechecking labs today. Await results. Treat as needed.

## 2024-01-23 NOTE — Progress Notes (Addendum)
 BP 133/85 (BP Location: Left Arm, Patient Position: Sitting, Cuff Size: Large)   Pulse 99   Temp 98.6 F (37 C) (Oral)   Resp 16   Ht 5' 9.02" (1.753 m)   Wt 193 lb (87.5 kg)   SpO2 98%   BMI 28.49 kg/m    Subjective:    Patient ID: Jesse Dunlap, male    DOB: 1966-09-24, 58 y.o.   MRN: [id number]  HPI: Jesse Dunlap is a 58 y.o. male presenting on 01/23/2024 for comprehensive medical examination. Current medical complaints include:  Larey Seat backwards about 6 days ago and has been having back pain since then.   DIABETES Hypoglycemic episodes:no Polydipsia/polyuria: no Visual disturbance: no Chest pain: no Paresthesias: no Glucose Monitoring: no  Accucheck frequency: Not Checking Taking Insulin?: no Blood Pressure Monitoring: not checking Retinal Examination: Up to Date Foot Exam: Up to Date Diabetic Education: Completed Pneumovax: Up to Date Influenza: Up to Date Aspirin: yes  HYPERTENSION / HYPERLIPIDEMIA Satisfied with current treatment? yes Duration of hypertension: chronic BP monitoring frequency: not checking BP medication side effects: no Past BP meds: hydrochlorothiazide, amlodipine Duration of hyperlipidemia: chronic Cholesterol medication side effects: no Cholesterol supplements: none Past cholesterol medications: none Medication compliance: excellent compliance Aspirin: yes Recent stressors: no Recurrent headaches: no Visual changes: no Palpitations: no Dyspnea: no Chest pain: no Lower extremity edema: no Dizzy/lightheaded: no  LOW TESTOSTERONE Duration: chronic Status: controlled  Satisfied with current treatment:  yes Previous testosterone therapies: androgel Medication side effects:  no Medication compliance: excellent compliance Decreased libido: no Fatigue: yes Depressed mood: yes Muscle weakness: yes Erectile dysfunction: no  DEPRESSION Mood status: controlled Satisfied with current treatment?: yes Symptom severity: mild   Duration of current treatment : chronic Side effects: no Medication compliance: excellent Psychotherapy/counseling: no  Previous psychiatric medications: effexor Depressed mood: no Anxious mood: no Anhedonia: no Significant weight loss or gain: no Insomnia: no  Fatigue: yes Feelings of worthlessness or guilt: no Impaired concentration/indecisiveness: no Suicidal ideations: no Hopelessness: no Crying spells: no    10/25/2023   11:26 AM 07/29/2023    1:25 PM 06/17/2023    8:47 AM 03/11/2023    9:06 AM 12/18/2022   11:05 AM  Depression screen PHQ 2/9  Decreased Interest 0 0 0 1 0  Down, Depressed, Hopeless 0 0 0 0 0  PHQ - 2 Score 0 0 0 1 0  Altered sleeping 0 0 0 0 0  Tired, decreased energy 0 3 0 0 0  Change in appetite 0 0 0 0 0  Feeling bad or failure about yourself  0 0 0 0 0  Trouble concentrating 0 0 0 0 0  Moving slowly or fidgety/restless 0 0 0 0 0  Suicidal thoughts 0 0 0 0 0  PHQ-9 Score 0 3 0 1 0  Difficult doing work/chores Not difficult at all Not difficult at all Not difficult at all Not difficult at all Not difficult at all    Interim Problems from his last visit: no  Depression Screen done today and results listed below:     10/25/2023   11:26 AM 07/29/2023    1:25 PM 06/17/2023    8:47 AM 03/11/2023    9:06 AM 12/18/2022   11:05 AM  Depression screen PHQ 2/9  Decreased Interest 0 0 0 1 0  Down, Depressed, Hopeless 0 0 0 0 0  PHQ - 2 Score 0 0 0 1 0  Altered sleeping 0 0 0 0 0  Tired,  decreased energy 0 3 0 0 0  Change in appetite 0 0 0 0 0  Feeling bad or failure about yourself  0 0 0 0 0  Trouble concentrating 0 0 0 0 0  Moving slowly or fidgety/restless 0 0 0 0 0  Suicidal thoughts 0 0 0 0 0  PHQ-9 Score 0 3 0 1 0  Difficult doing work/chores Not difficult at all Not difficult at all Not difficult at all Not difficult at all Not difficult at all    Past Medical History:  Past Medical History:  Diagnosis Date  . Attention deficit disorder   .  Diabetes mellitus without complication (HCC)    prior to gastric sleeve and wt loss  . Family history of breast cancer   . Hyperlipidemia   . Hypertension    prior to gastric sleeve and wt loss  . Hypogonadism in male   . Obesity (BMI 35.0-39.9 without comorbidity)   . OSA on CPAP     Surgical History:  Past Surgical History:  Procedure Laterality Date  . CARDIAC CATHETERIZATION  approx 2000  . COLONOSCOPY  approx 2005   had polyp  . COLONOSCOPY WITH PROPOFOL N/A 03/16/2016   Procedure: COLONOSCOPY WITH PROPOFOL;  Surgeon: Midge Minium, MD;  Location: [facility];  Service: Endoscopy;  Laterality: N/A;  CPAP  . COLONOSCOPY WITH PROPOFOL N/A 02/29/2020   Procedure: COLONOSCOPY WITH BIOPSYr;  Surgeon: Midge Minium, MD;  Location: [facility];  Service: Endoscopy;  Laterality: N/A;  sleep apnea Priority 3  . LAPAROSCOPIC GASTRIC SLEEVE RESECTION  04/2013  . POLYPECTOMY  03/16/2016   Procedure: POLYPECTOMY;  Surgeon: Midge Minium, MD;  Location: [facility];  Service: Endoscopy;;  . POLYPECTOMY N/A 02/29/2020   Procedure: POLYPECTOMY;  Surgeon: Midge Minium, MD;  Location: [facility];  Service: Endoscopy;  Laterality: N/A;  . RHINOPLASTY      Medications:  Current Outpatient Medications on File Prior to Visit  Medication Sig  . albuterol (VENTOLIN HFA) 108 (90 Base) MCG/ACT inhaler Inhale 2 puffs into the lungs every 6 (six) hours as needed for wheezing or shortness of breath.  Marland Kitchen amLODipine (NORVASC) 10 MG tablet TAKE 1 TABLET BY MOUTH DAILY  . aspirin EC 81 MG tablet Take 81 mg by mouth daily.  . benazepril (LOTENSIN) 40 MG tablet TAKE 1 TABLET BY MOUTH DAILY  . hydrochlorothiazide (HYDRODIURIL) 25 MG tablet TAKE 1 TABLET BY MOUTH DAILY  . metFORMIN (GLUCOPHAGE-XR) 500 MG 24 hr tablet TAKE 2 TABLETS BY MOUTH TWICE A DAY WITH A MEAL  . Testosterone 1.62 % GEL Place 3 Pump onto the skin daily.  . tirzepatide (MOUNJARO) 5 MG/0.5ML Pen Inject 5 mg into the  skin once a week.  . venlafaxine XR (EFFEXOR-XR) 150 MG 24 hr capsule TAKE 1 CAPSULE BY MOUTH ONCE DAILY WITH BREAKFAST AS DIRECTED ALONG WITH 75MG  = 225MG    No current facility-administered medications on file prior to visit.    Allergies:  Allergies  Allergen Reactions  . Tramadol Itching    Social History:  Social History   Socioeconomic History  . Marital status: Married    Spouse name: Amy  . Number of children: 1  . Years of education: Not on file  . Highest education level: Some college, no degree  Occupational History  . Not on file  Tobacco Use  . Smoking status: Former    Current packs/day: 0.00    Average packs/day: 1.5 packs/day for 10.0 years (15.0 ttl  pk-yrs)    Types: Cigarettes    Start date: 11/05/1976    Quit date: 11/05/1986    Years since quitting: 37.2    Passive exposure: Past  . Smokeless tobacco: Never  Vaping Use  . Vaping status: Never Used  Substance and Sexual Activity  . Alcohol use: Yes    Alcohol/week: 4.0 - 5.0 standard drinks of alcohol    Types: 4 - 5 Glasses of wine per week    Comment:    . Drug use: No  . Sexual activity: Not Currently  Other Topics Concern  . Not on file  Social History Narrative  . Not on file   Social Drivers of Health   Financial Resource Strain: Low Risk  (01/23/2024)   Overall Financial Resource Strain (CARDIA)   . Difficulty of Paying Living Expenses: Not very hard  Food Insecurity: Not on file  Transportation Needs: Not on file  Physical Activity: Insufficiently Active (01/23/2024)   Exercise Vital Sign   . Days of Exercise per Week: 1 day   . Minutes of Exercise per Session: 40 min  Stress: No Stress Concern Present (01/23/2024)   [employer] of Occupational Health - Occupational Stress Questionnaire   . Feeling of Stress : Not at all  Social Connections: Moderately Integrated (01/23/2024)   Social Connection and Isolation Panel [NHANES]   . Frequency of Communication with Friends and Family:  Twice a week   . Frequency of Social Gatherings with Friends and Family: Once a week   . Attends Religious Services: 1 to 4 times per year   . Active Member of Clubs or Organizations: No   . Attends Banker Meetings: Never   . Marital Status: Married  Catering manager Violence: Not on file   Social History   Tobacco Use  Smoking Status Former  . Current packs/day: 0.00  . Average packs/day: 1.5 packs/day for 10.0 years (15.0 ttl pk-yrs)  . Types: Cigarettes  . Start date: 11/05/1976  . Quit date: 11/05/1986  . Years since quitting: 37.2  . Passive exposure: Past  Smokeless Tobacco Never   Social History   Substance and Sexual Activity  Alcohol Use Yes  . Alcohol/week: 4.0 - 5.0 standard drinks of alcohol  . Types: 4 - 5 Glasses of wine per week   Comment:      Family History:  Family History  Problem Relation Age of Onset  . Diabetes Mother   . Breast cancer Mother 24       CHEK2 pos  . Atrial fibrillation Mother   . Thyroid disease Mother   . Diabetes Father   . Hypertension Father   . Skin cancer Father   . Breast cancer Sister 54  . Alzheimer's disease Maternal Grandmother   . Breast cancer Maternal Grandmother        dx over 50  . Prostate cancer Maternal Grandfather        dx in his 2s-70s  . Lung cancer Paternal Grandfather   . Prostate cancer Other        PGF's father    Past medical history, surgical history, medications, allergies, family history and social history reviewed with patient today and changes made to appropriate areas of the chart.   Review of Systems  Constitutional:  Positive for diaphoresis. Negative for chills, fever, malaise/fatigue and weight loss.  HENT: Negative.    Eyes: Negative.   Respiratory: Negative.    Cardiovascular: Negative.   Gastrointestinal: Negative.   Genitourinary:  Negative.   Musculoskeletal:  Positive for back pain. Negative for falls, joint pain, myalgias and neck pain.  Skin: Negative.    Neurological: Negative.   Endo/Heme/Allergies: Negative.   Psychiatric/Behavioral: Negative.     All other ROS negative except what is listed above and in the HPI.      Objective:    BP 133/85 (BP Location: Left Arm, Patient Position: Sitting, Cuff Size: Large)   Pulse 99   Temp 98.6 F (37 C) (Oral)   Resp 16   Ht 5' 9.02" (1.753 m)   Wt 193 lb (87.5 kg)   SpO2 98%   BMI 28.49 kg/m   Wt Readings from Last 3 Encounters:  01/23/24 193 lb (87.5 kg)  10/25/23 206 lb (93.4 kg)  07/29/23 203 lb (92.1 kg)    Physical Exam Vitals and nursing note reviewed.  Constitutional:      General: He is not in acute distress.    Appearance: Normal appearance. He is not ill-appearing, toxic-appearing or diaphoretic.  HENT:     Head: Normocephalic and atraumatic.     Right Ear: Tympanic membrane, ear canal and external ear normal. There is no impacted cerumen.     Left Ear: Tympanic membrane, ear canal and external ear normal. There is no impacted cerumen.     Nose: Nose normal. No congestion or rhinorrhea.     Mouth/Throat:     Mouth: Mucous membranes are moist.     Pharynx: Oropharynx is clear. No oropharyngeal exudate or posterior oropharyngeal erythema.  Eyes:     General: No scleral icterus.       Right eye: No discharge.        Left eye: No discharge.     Extraocular Movements: Extraocular movements intact.     Conjunctiva/sclera: Conjunctivae normal.     Pupils: Pupils are equal, round, and reactive to light.  Neck:     Vascular: No carotid bruit.  Cardiovascular:     Rate and Rhythm: Normal rate and regular rhythm.     Pulses: Normal pulses.     Heart sounds: No murmur heard.    No friction rub. No gallop.  Pulmonary:     Effort: Pulmonary effort is normal. No respiratory distress.     Breath sounds: Normal breath sounds. No stridor. No wheezing, rhonchi or rales.  Chest:     Chest wall: No tenderness.  Abdominal:     General: Abdomen is flat. Bowel sounds are normal.  There is no distension.     Palpations: Abdomen is soft. There is no mass.     Tenderness: There is no abdominal tenderness. There is no right CVA tenderness, left CVA tenderness, guarding or rebound.     Hernia: No hernia is present.  Genitourinary:    Comments: Genital exam deferred with shared decision making Musculoskeletal:        General: No swelling, tenderness, deformity or signs of injury.     Cervical back: Normal range of motion and neck supple. No rigidity. No muscular tenderness.     Right lower leg: No edema.     Left lower leg: No edema.  Lymphadenopathy:     Cervical: No cervical adenopathy.  Skin:    General: Skin is warm and dry.     Capillary Refill: Capillary refill takes less than 2 seconds.     Coloration: Skin is not jaundiced or pale.     Findings: No bruising, erythema, lesion or rash.  Neurological:  General: No focal deficit present.     Mental Status: He is alert and oriented to person, place, and time.     Cranial Nerves: No cranial nerve deficit.     Sensory: No sensory deficit.     Motor: No weakness.     Coordination: Coordination normal.     Gait: Gait normal.     Deep Tendon Reflexes: Reflexes normal.  Psychiatric:        Mood and Affect: Mood normal.        Behavior: Behavior normal.        Thought Content: Thought content normal.        Judgment: Judgment normal.    Results for orders placed or performed in visit on 11/13/23  Prostate Health Index   Collection Time: 11/13/23  8:35 AM  Result Value Ref Range   Prostate Specific Ag 6.5 (H) 0.0 - 3.9 ng/mL  PHI Score Reflex   Collection Time: 11/13/23  8:35 AM  Result Value Ref Range   PSA, Free 1.42 N/A ng/mL   % Free PSA 21.8 %   p2PSA 20.3 N/A pg/mL   Prostate Heath Index Score 36.4       Assessment & Plan:   Problem List Items Addressed This Visit       Cardiovascular and Mediastinum   Hypertension   Under good control on current regimen. Continue current regimen. Continue  to monitor. Call with any concerns. Refills given. Labs drawn today.        Relevant Orders   Microalbumin, Urine Waived     Endocrine   Type 2 diabetes mellitus with stage 2 chronic kidney disease, without long-term current use of insulin (HCC)   Rechecking labs today. Await results. Treat as needed.       Relevant Orders   Microalbumin, Urine Waived   Bayer DCA Hb A1c Waived   Hypogonadism in male   Rechecking labs today. Await results. Treat as needed.       Relevant Orders   Testosterone, free, total([facility])     Genitourinary   Benign prostatic hyperplasia with urinary hesitancy   Under good control on current regimen. Continue current regimen. Continue to monitor. Call with any concerns. Refills given. Labs drawn today.        Relevant Orders   PSA     Other   Hypercholesteremia   Rechecking labs today. Await results. Treat as needed.       Low back pain   Relevant Medications   cyclobenzaprine (FLEXERIL) 10 MG tablet   Other Visit Diagnoses       Routine general medical examination at a health care facility    -  Primary   Vaccines up to date. Screening labs checked today. Colonoscopy up to date. Continue diet and exercise. Call with any concerns.   Relevant Orders   Comprehensive metabolic panel   CBC with Differential/Platelet   Lipid Panel w/o Chol/HDL Ratio   PSA   TSH       LABORATORY TESTING:  Health maintenance labs ordered today as discussed above.   The natural history of prostate cancer and ongoing controversy regarding screening and potential treatment outcomes of prostate cancer has been discussed with the patient. The meaning of a false positive PSA and a false negative PSA has been discussed. He indicates understanding of the limitations of this screening test and wishes to proceed with screening PSA testing.   IMMUNIZATIONS:   - Tdap: Tetanus vaccination status reviewed: last tetanus booster  within 10 years. - Influenza: Up  to date - Pneumovax: Up to date - Prevnar: Not applicable - COVID: Up to date - Shingrix vaccine: Up to date  SCREENING: - Colonoscopy: Up to date  Discussed with patient purpose of the colonoscopy is to detect colon cancer at curable precancerous or early stages   PATIENT COUNSELING:    Sexuality: Discussed sexually transmitted diseases, partner selection, use of condoms, avoidance of unintended pregnancy  and contraceptive alternatives.   Advised to avoid cigarette smoking.  I discussed with the patient that most people either abstain from alcohol or drink within safe limits (<=14/week and <=4 drinks/occasion for males, <=7/weeks and <= 3 drinks/occasion for females) and that the risk for alcohol disorders and other health effects rises proportionally with the number of drinks per week and how often a drinker exceeds daily limits.  Discussed cessation/primary prevention of drug use and availability of treatment for abuse.   Diet: Encouraged to adjust caloric intake to maintain  or achieve ideal body weight, to reduce intake of dietary saturated fat and total fat, to limit sodium intake by avoiding high sodium foods and not adding table salt, and to maintain adequate dietary potassium and calcium preferably from fresh fruits, vegetables, and low-fat dairy products.    stressed the importance of regular exercise  Injury prevention: Discussed safety belts, safety helmets, smoke detector, smoking near bedding or upholstery.   Dental health: Discussed importance of regular tooth brushing, flossing, and dental visits.   Follow up plan: NEXT PREVENTATIVE PHYSICAL DUE IN 1 YEAR. Return in about 3 months (around 04/24/2024).

## 2024-01-23 NOTE — Assessment & Plan Note (Signed)
 Under good control on current regimen. Continue current regimen. Continue to monitor. Call with any concerns. Refills given. Labs drawn today.

## 2024-01-24 ENCOUNTER — Other Ambulatory Visit: Payer: Self-pay | Admitting: Family Medicine

## 2024-01-24 ENCOUNTER — Encounter: Payer: Self-pay | Admitting: Family Medicine

## 2024-01-24 MED ORDER — TIRZEPATIDE 5 MG/0.5ML ~~LOC~~ SOAJ
5.0000 mg | SUBCUTANEOUS | 1 refills | Status: DC
Start: 2024-01-24 — End: 2024-02-14

## 2024-01-24 MED ORDER — TESTOSTERONE 1.62 % TD GEL: 3.0000 | Freq: Every day | TRANSDERMAL | 0 refills | Status: DC

## 2024-01-29 ENCOUNTER — Telehealth: Payer: Self-pay

## 2024-01-29 LAB — COMPREHENSIVE METABOLIC PANEL
ALT: 25 IU/L (ref 0–44)
AST: 23 IU/L (ref 0–40)
Albumin: 4.4 g/dL (ref 3.8–4.9)
Alkaline Phosphatase: 65 IU/L (ref 44–121)
BUN/Creatinine Ratio: 12 (ref 9–20)
BUN: 11 mg/dL (ref 6–24)
Bilirubin Total: 0.7 mg/dL (ref 0.0–1.2)
CO2: 23 mmol/L (ref 20–29)
Calcium: 9.5 mg/dL (ref 8.7–10.2)
Chloride: 99 mmol/L (ref 96–106)
Creatinine, Ser: 0.95 mg/dL (ref 0.76–1.27)
Globulin, Total: 2.8 g/dL (ref 1.5–4.5)
Glucose: 108 mg/dL — ABNORMAL HIGH (ref 70–99)
Potassium: 3.8 mmol/L (ref 3.5–5.2)
Sodium: 140 mmol/L (ref 134–144)
Total Protein: 7.2 g/dL (ref 6.0–8.5)
eGFR: 92 mL/min/{1.73_m2} (ref >59)

## 2024-01-29 LAB — LIPID PANEL W/O CHOL/HDL RATIO
Cholesterol, Total: 205 mg/dL — ABNORMAL HIGH (ref 100–199)
HDL: 65 mg/dL (ref >39)
LDL Chol Calc (NIH): 113 mg/dL — ABNORMAL HIGH (ref 0–99)
Triglycerides: 157 mg/dL — ABNORMAL HIGH (ref 0–149)
VLDL Cholesterol Cal: 27 mg/dL (ref 5–40)

## 2024-01-29 LAB — CBC WITH DIFFERENTIAL/PLATELET
Basophils Absolute: 0 10*3/uL (ref 0.0–0.2)
Basos: 1 %
EOS (ABSOLUTE): 0.1 10*3/uL (ref 0.0–0.4)
Eos: 2 %
Hematocrit: 48.6 % (ref 37.5–51.0)
Hemoglobin: 16.8 g/dL (ref 13.0–17.7)
Immature Grans (Abs): 0 10*3/uL (ref 0.0–0.1)
Immature Granulocytes: 0 %
Lymphocytes Absolute: 1.2 10*3/uL (ref 0.7–3.1)
Lymphs: 21 %
MCH: 33.2 pg — ABNORMAL HIGH (ref 26.6–33.0)
MCHC: 34.6 g/dL (ref 31.5–35.7)
MCV: 96 fL (ref 79–97)
Monocytes Absolute: 0.5 10*3/uL (ref 0.1–0.9)
Monocytes: 9 %
Neutrophils Absolute: 3.7 10*3/uL (ref 1.4–7.0)
Neutrophils: 67 %
Platelets: 210 10*3/uL (ref 150–450)
RBC: 5.06 x10E6/uL (ref 4.14–5.80)
RDW: 12.9 % (ref 11.6–15.4)
WBC: 5.5 10*3/uL (ref 3.4–10.8)

## 2024-01-29 LAB — TESTOSTERONE, FREE, TOTAL, SHBG
Sex Hormone Binding: 31.4 nmol/L (ref 19.3–76.4)
Testosterone, Free: 20.2 pg/mL (ref 7.2–24.0)
Testosterone: 719 ng/dL (ref 264–916)

## 2024-01-29 LAB — TSH: TSH: 1.67 u[IU]/mL (ref 0.450–4.500)

## 2024-01-29 LAB — PSA: Prostate Specific Ag, Serum: 6.7 ng/mL — ABNORMAL HIGH (ref 0.0–4.0)

## 2024-01-29 NOTE — Telephone Encounter (Signed)
 Copied from CRM [id number]. Topic: Clinical - Prescription Issue >> Jan 29, 2024 11:21 AM Elle L wrote: Reason for CRM: The patient needs a Prior Authorization for his tirzepatide [facility]) 5 MG/0.5ML Pen. Their prior authorization number is [id number].

## 2024-01-29 NOTE — Telephone Encounter (Signed)
 Will begin PA and update patient when completed.

## 2024-01-30 ENCOUNTER — Other Ambulatory Visit: Payer: Self-pay | Admitting: Family Medicine

## 2024-01-30 MED ORDER — TESTOSTERONE 1.62 % TD GEL: 3.0000 | Freq: Every day | TRANSDERMAL | 5 refills | Status: DC

## 2024-02-03 ENCOUNTER — Other Ambulatory Visit: Payer: Self-pay | Admitting: Family Medicine

## 2024-02-03 ENCOUNTER — Encounter: Payer: Self-pay | Admitting: Family Medicine

## 2024-02-03 DIAGNOSIS — E1122 Type 2 diabetes mellitus with diabetic chronic kidney disease: Secondary | ICD-10-CM

## 2024-02-05 ENCOUNTER — Telehealth: Payer: Self-pay

## 2024-02-05 NOTE — Progress Notes (Signed)
 Care Guide Pharmacy Note  02/05/2024 Name: Jesse Dunlap MRN: [id number] DOB: [birth year]  Referred By: Dorcas Carrow, DO Reason for referral: Care Coordination (Outreach to schedule with Pharm d )   Jesse Dunlap is a 58 y.o. year old male who is a primary care patient of Dorcas Carrow, DO.  Jesse Dunlap was referred to the pharmacist for assistance related to: DMII  Successful contact was made with the patient to discuss pharmacy services including being ready for the pharmacist to call at least 5 minutes before the scheduled appointment time and to have medication bottles and any blood pressure readings ready for review. The patient agreed to meet with the pharmacist via telephone visit on (date/time).02/14/2024  Penne Lash , RMA     [place]  [facility], [facility] Guide  Direct Dial: [phone]  Website: [email or web address]

## 2024-02-10 ENCOUNTER — Ambulatory Visit: Payer: Self-pay | Admitting: Family Medicine

## 2024-02-10 NOTE — Telephone Encounter (Signed)
 Copied from CRM [id number]. Topic: Clinical - Red Word Triage >> Feb 10, 2024  8:51 AM Turkey B wrote: [facility] that prompted transfer to Nurse Triage: pt has severe pain in his back   Chief Complaint: Back Pain Symptoms: Mid, left back pain Frequency: constant Pertinent Negatives: Patient denies chest pain Disposition: [] ED /[] Urgent Care (no appt availability in office) / [x] Appointment(In office/virtual)/ []  [facility]/ [] Home Care/ [] Refused Recommended Disposition /[] [place] [facility]/ []  Follow-up with PCP Additional Notes: Pain x 1 month after a fall. Given muscle relaxers but states they dont help. PCP not avail May. Appt scheduled for 4/8 with Clydie Braun, NP Reason for Disposition  [1] MODERATE back pain (e.g., interferes with normal activities) AND [2] present > 3 days  Answer Assessment - Initial Assessment Questions 1. ONSET: "When did the pain begin?"      Started 1 month ago, after a fall  2. LOCATION: "Where does it hurt?" (upper, mid or lower back)     Mid back on left side  3. SEVERITY: "How bad is the pain?"  (e.g., Scale 1-10; mild, moderate, or severe)   - MILD (1-3): Doesn't interfere with normal activities.    - MODERATE (4-7): Interferes with normal activities or awakens from sleep.    - SEVERE (8-10): Excruciating pain, unable to do any normal activities.      Dull to stabbing pain, moderate  4. PATTERN: "Is the pain constant?" (e.g., yes, no; constant, intermittent)      Constant  5. RADIATION: "Does the pain shoot into your legs or somewhere else?"     No  6. CAUSE:  "What do you think is causing the back pain?"      Fell down steps 1 month ago  7. BACK OVERUSE:  "Any recent lifting of heavy objects, strenuous work or exercise?"     No  8. MEDICINES: "What have you taken so far for the pain?" (e.g., nothing, acetaminophen, NSAIDS)     Muscle relaxer and heating pad  9. NEUROLOGIC SYMPTOMS: "Do you have any weakness, numbness, or problems  with bowel/bladder control?"     No  10. OTHER SYMPTOMS: "Do you have any other symptoms?" (e.g., fever, abdomen pain, burning with urination, blood in urine)       No  11. PREGNANCY: "Is there any chance you are pregnant?" "When was your last menstrual period?"       No  Protocols used: Back Pain-A-AH

## 2024-02-11 ENCOUNTER — Ambulatory Visit
Admission: RE | Admit: 2024-02-11 | Discharge: 2024-02-11 | Disposition: A | Discharge status: Home / Self Care | Attending: Family Medicine | Admitting: Family Medicine

## 2024-02-11 ENCOUNTER — Ambulatory Visit
Admission: RE | Admit: 2024-02-11 | Discharge: 2024-02-11 | Disposition: A | Discharge status: Home / Self Care | Source: Ambulatory Visit | Attending: Family Medicine | Admitting: Family Medicine

## 2024-02-11 ENCOUNTER — Encounter: Payer: Self-pay | Admitting: Family Medicine

## 2024-02-11 ENCOUNTER — Ambulatory Visit (INDEPENDENT_AMBULATORY_CARE_PROVIDER_SITE_OTHER): Admitting: Family Medicine

## 2024-02-11 VITALS — BP 129/85 | HR 111 | Temp 98.5°F | Ht 69.0 in | Wt 188.2 lb

## 2024-02-11 DIAGNOSIS — M549 Dorsalgia, unspecified: Secondary | ICD-10-CM | POA: Insufficient documentation

## 2024-02-11 MED ORDER — NAPROXEN 500 MG PO TABS: 500.0000 mg | ORAL_TABLET | Freq: Two times a day (BID) | ORAL | 3 refills | Status: DC

## 2024-02-11 NOTE — Progress Notes (Signed)
 BP 129/85 (BP Location: Left Arm, Patient Position: Sitting, Cuff Size: Normal)   Pulse (!) 111   Temp 98.5 F (36.9 C) (Oral)   Ht 5\' 9"  (1.753 m)   Wt 188 lb 3.2 oz (85.4 kg)   SpO2 98%   BMI 27.79 kg/m    Subjective:    Patient ID: Jesse Dunlap, male    DOB: Oct 19, 1966, 58 y.o.   MRN: [id number]  HPI: Jesse Dunlap is a 58 y.o. male  Chief Complaint  Patient presents with   Back Pain    Pt states that about a month ago he fell down a few steps and back has hurt since.     BACK PAIN Duration: about a month Mechanism of injury: fall Location: L side of mid back Onset: sudden Severity: 3/10 Quality: dull pain Frequency: constant, waxes and wanes Radiation: none Aggravating factors:  moving and activity Alleviating factors: rest Status: worse Treatments attempted: ibuprofen, flexeril  Relief with NSAIDs?: mild Nighttime pain:  has, not recently Paresthesias / decreased sensation:  no Bowel / bladder incontinence:  no Fevers:  no Dysuria / urinary frequency:  no  Relevant past medical, surgical, family and social history reviewed and updated as indicated. Interim medical history since our last visit reviewed. Allergies and medications reviewed and updated.  Review of Systems  Constitutional: Negative.   Respiratory: Negative.    Cardiovascular: Negative.   Musculoskeletal:  Positive for back pain and myalgias. Negative for arthralgias, gait problem, joint swelling, neck pain and neck stiffness.  Skin: Negative.   Psychiatric/Behavioral: Negative.      Per HPI unless specifically indicated above     Objective:    BP 129/85 (BP Location: Left Arm, Patient Position: Sitting, Cuff Size: Normal)   Pulse (!) 111   Temp 98.5 F (36.9 C) (Oral)   Ht 5\' 9"  (1.753 m)   Wt 188 lb 3.2 oz (85.4 kg)   SpO2 98%   BMI 27.79 kg/m   Wt Readings from Last 3 Encounters:  02/11/24 188 lb 3.2 oz (85.4 kg)  01/23/24 193 lb (87.5 kg)  10/25/23 206 lb (93.4 kg)     Physical Exam Vitals and nursing note reviewed.  Constitutional:      General: He is not in acute distress.    Appearance: Normal appearance. He is not ill-appearing, toxic-appearing or diaphoretic.  HENT:     Head: Normocephalic and atraumatic.     Right Ear: External ear normal.     Left Ear: External ear normal.     Nose: Nose normal.     Mouth/Throat:     Mouth: Mucous membranes are moist.     Pharynx: Oropharynx is clear.  Eyes:     General: No scleral icterus.       Right eye: No discharge.        Left eye: No discharge.     Extraocular Movements: Extraocular movements intact.     Conjunctiva/sclera: Conjunctivae normal.     Pupils: Pupils are equal, round, and reactive to light.  Cardiovascular:     Rate and Rhythm: Normal rate and regular rhythm.     Pulses: Normal pulses.     Heart sounds: Normal heart sounds. No murmur heard.    No friction rub. No gallop.  Pulmonary:     Effort: Pulmonary effort is normal. No respiratory distress.     Breath sounds: Normal breath sounds. No stridor. No wheezing, rhonchi or rales.  Chest:     Chest wall:  No tenderness.  Musculoskeletal:        General: Tenderness (L sided lumbar paraspinals) present. Normal range of motion.     Cervical back: Normal range of motion and neck supple.  Skin:    General: Skin is warm and dry.     Capillary Refill: Capillary refill takes less than 2 seconds.     Coloration: Skin is not jaundiced or pale.     Findings: No bruising, erythema, lesion or rash.  Neurological:     General: No focal deficit present.     Mental Status: He is alert and oriented to person, place, and time. Mental status is at baseline.  Psychiatric:        Mood and Affect: Mood normal.        Behavior: Behavior normal.        Thought Content: Thought content normal.        Judgment: Judgment normal.     Results for orders placed or performed in visit on 01/23/24  Microalbumin, Urine Waived   Collection Time: 01/23/24   9:00 AM  Result Value Ref Range   Microalb, Ur Waived 80 (H) 0 - 19 mg/L   Creatinine, Urine Waived 300 10 - 300 mg/dL   Microalb/Creat Ratio 30-300 (H) <30 mg/g  Bayer DCA Hb A1c Waived   Collection Time: 01/23/24  9:00 AM  Result Value Ref Range   HB A1C (BAYER DCA - WAIVED) 6.4 (H) 4.8 - 5.6 %  Comprehensive metabolic panel   Collection Time: 01/23/24  9:01 AM  Result Value Ref Range   Glucose 108 (H) 70 - 99 mg/dL   BUN 11 6 - 24 mg/dL   Creatinine, Ser 8.29 0.76 - 1.27 mg/dL   eGFR 92 >56 OZ/HYQ/6.57   BUN/Creatinine Ratio 12 9 - 20   Sodium 140 134 - 144 mmol/L   Potassium 3.8 3.5 - 5.2 mmol/L   Chloride 99 96 - 106 mmol/L   CO2 23 20 - 29 mmol/L   Calcium 9.5 8.7 - 10.2 mg/dL   Total Protein 7.2 6.0 - 8.5 g/dL   Albumin 4.4 3.8 - 4.9 g/dL   Globulin, Total 2.8 1.5 - 4.5 g/dL   Bilirubin Total 0.7 0.0 - 1.2 mg/dL   Alkaline Phosphatase 65 44 - 121 IU/L   AST 23 0 - 40 IU/L   ALT 25 0 - 44 IU/L  CBC with Differential/Platelet   Collection Time: 01/23/24  9:01 AM  Result Value Ref Range   WBC 5.5 3.4 - 10.8 x10E3/uL   RBC 5.06 4.14 - 5.80 x10E6/uL   Hemoglobin 16.8 13.0 - 17.7 g/dL   Hematocrit 84.6 96.2 - 51.0 %   MCV 96 79 - 97 fL   MCH 33.2 (H) 26.6 - 33.0 pg   MCHC 34.6 31.5 - 35.7 g/dL   RDW 95.2 84.1 - 32.4 %   Platelets 210 150 - 450 x10E3/uL   Neutrophils 67 Not Estab. %   Lymphs 21 Not Estab. %   Monocytes 9 Not Estab. %   Eos 2 Not Estab. %   Basos 1 Not Estab. %   Neutrophils Absolute 3.7 1.4 - 7.0 x10E3/uL   Lymphocytes Absolute 1.2 0.7 - 3.1 x10E3/uL   Monocytes Absolute 0.5 0.1 - 0.9 x10E3/uL   EOS (ABSOLUTE) 0.1 0.0 - 0.4 x10E3/uL   Basophils Absolute 0.0 0.0 - 0.2 x10E3/uL   Immature Granulocytes 0 Not Estab. %   Immature Grans (Abs) 0.0 0.0 - 0.1 x10E3/uL  Lipid  Panel w/o Chol/HDL Ratio   Collection Time: 01/23/24  9:01 AM  Result Value Ref Range   Cholesterol, Total 205 (H) 100 - 199 mg/dL   Triglycerides 161 (H) 0 - 149 mg/dL   HDL 65  >09 mg/dL   VLDL Cholesterol Cal 27 5 - 40 mg/dL   LDL Chol Calc (NIH) 604 (H) 0 - 99 mg/dL  PSA   Collection Time: 01/23/24  9:01 AM  Result Value Ref Range   Prostate Specific Ag, Serum 6.7 (H) 0.0 - 4.0 ng/mL  TSH   Collection Time: 01/23/24  9:01 AM  Result Value Ref Range   TSH 1.670 0.450 - 4.500 uIU/mL  Testosterone, free, total([facility])   Collection Time: 01/23/24  9:01 AM  Result Value Ref Range   Testosterone 719 264 - 916 ng/dL   Testosterone, Free 54.0 7.2 - 24.0 pg/mL   Sex Hormone Binding 31.4 19.3 - 76.4 nmol/L      Assessment & Plan:   Problem List Items Addressed This Visit   None Visit Diagnoses       Acute left-sided back pain, unspecified back location    -  Primary   Will treat with flexeril at bedtime and naproxen BID. Continue stretches. Will check x-ray- call if not better by Monday and we'll get him into PT.   Relevant Medications   naproxen (NAPROSYN) 500 MG tablet   Other Relevant Orders   DG Lumbar Spine Complete   DG Thoracic Spine W/Swimmers        Follow up plan: Return in about 6 weeks (around 03/24/2024).

## 2024-02-14 ENCOUNTER — Other Ambulatory Visit: Payer: Self-pay

## 2024-02-14 NOTE — Progress Notes (Signed)
 02/14/2024 Name: Jesse Dunlap MRN: [id number] DOB: 07-22-1966  Chief Complaint  Patient presents with   Diabetes Management Plan   Jesse Dunlap is a 58 y.o. year old male who presented for a telephone visit.   They were referred to the pharmacist by their PCP for assistance in managing diabetes.   Subjective:  Care Team: Primary Care Provider: Dorcas Carrow, DO ; Next Scheduled Visit: 03/27/2024  Medication Access/Adherence  Current Pharmacy:  [facility] [id number] [place], [place] - [address] [place] [address] Phone: [phone] Fax: [phone]  -Patient reports affordability concerns with their medications: Yes  -Patient reports access/transportation concerns to their pharmacy: No  -Patient reports adherence concerns with their medications:  No    Diabetes: Current medications: metformin xr 1000mg  BID -Recently prescribed Mounjaro 5mg  weekly, but initially insurance was not paying for medication.  Patient states PA approved; but even with copay card, medication is going to be $800.  He was provided a Mounjaro sample and took last dose this past Monday. -Decreased metformin to 1000mg  daily when started [facility] [address] -A1c 6.4% 3/20  Hypertension: Current medications: amlodipine 10mg  daily, benazepril 40mg  daily, hydrochlorothiazide 25mg  daily -Last office BP 129/85  Hyperlipidemia/ASCVD Risk Reduction Current lipid lowering medications: none Antiplatelet regimen: ASA 81mg  daily   Objective: Lab Results  Component Value Date   HGBA1C 6.4 (H) 01/23/2024   Lab Results  Component Value Date   CREATININE 0.95 01/23/2024   BUN 11 01/23/2024   NA 140 01/23/2024   K 3.8 01/23/2024   CL 99 01/23/2024   CO2 23 01/23/2024   Lab Results  Component Value Date   CHOL 205 (H) 01/23/2024   HDL 65 01/23/2024   LDLCALC 113 (H) 01/23/2024   TRIG 157 (H) 01/23/2024   CHOLHDL 3.2 04/22/2018   Medications Reviewed Today      Reviewed by Lenna Gilford, RPH (Pharmacist) on 02/14/24 at 1114  Med List Status: <None>   Medication Order Taking? Sig Documenting Provider Last Dose Status Informant  albuterol (VENTOLIN HFA) 108 (90 Base) MCG/ACT inhaler [id number] Yes Inhale 2 puffs into the lungs every 6 (six) hours as needed for wheezing or shortness of breath. Johnson, Megan P, DO Taking Active   amLODipine (NORVASC) 10 MG tablet [id number] Yes TAKE 1 TABLET BY MOUTH DAILY Dorcas Carrow, DO Taking Active   aspirin EC 81 MG tablet [id number] Yes Take 81 mg by mouth daily. [provider] Taking Active   benazepril (LOTENSIN) 40 MG tablet [id number] Yes TAKE 1 TABLET BY MOUTH DAILY Johnson, Megan P, DO Taking Active   cyclobenzaprine (FLEXERIL) 10 MG tablet [id number] Yes Take 0.5-1 tablets (5-10 mg total) by mouth 3 (three) times daily as needed for muscle spasms. Olevia Perches P, DO Taking Active   hydrochlorothiazide (HYDRODIURIL) 25 MG tablet [id number] Yes TAKE 1 TABLET BY MOUTH DAILY Johnson, Megan P, DO Taking Active   metFORMIN (GLUCOPHAGE-XR) 500 MG 24 hr tablet [id number] Yes TAKE 2 TABLETS BY MOUTH TWICE A DAY WITH A MEAL  Patient taking differently: Take 1,000 mg by mouth daily with breakfast.   Olevia Perches P, DO Taking Active   naproxen (NAPROSYN) 500 MG tablet [id number] Yes Take 1 tablet (500 mg total) by mouth 2 (two) times daily with a meal. Olevia Perches P, DO Taking Active   Testosterone 1.62 % GEL [id number] Yes Place 3 Pump onto the skin daily. Johnson, Megan P, DO Taking Active   tirzepatide [facility]) 5  MG/0.5ML Pen [id number] Yes Inject 5 mg into the skin once a week. Laural Benes, Megan P, DO Taking Active   venlafaxine XR (EFFEXOR-XR) 150 MG 24 hr capsule [id number] Yes TAKE 1 CAPSULE BY MOUTH ONCE DAILY WITH BREAKFAST AS DIRECTED ALONG WITH 75MG  = 225MG   Patient taking differently: Take 150 mg by mouth daily with breakfast.   Dorcas Carrow, DO Taking Active            Assessment/Plan:    Diabetes: -Currently controlled -Contacted insurance, and patient's plan has a max pharmacy benefit of $1000 per calendar year- once this is paid toward medications, patient is responsible for 100% of cost.  Discussed with patient; and unfortunately, this will likely make long-term GLP1 use not possible.   -Copay card for Trulicity will typically make patient's copay $25 regardless of insurance copay, but there is a max yearly benefit of $1950.  Patient could potentially get two fills of Trulicity at $25 copay, which he prefers to do if able.   -Obtained manufacturer copay card, and prescription pending for Trluicity 1.5mg  weekly for Dr. Laural Benes to sign if in agreement  Hypertension: -Currently moderately controlled -Continue current regimen and regular follow-up with PCP  Hyperlipidemia/ASCVD Risk Reduction: -Currently uncontrolled.  -Recommend initiation of moderate or high intensity statin with follow-up lipid panel and LFT's in 12 weeks  Follow Up Plan: Patient will notify me regarding ability to pick up and start Trulicity, and I will schedule f/u accordingly  Lenna Gilford, PharmD, DPLA

## 2024-02-17 ENCOUNTER — Encounter: Payer: Self-pay | Admitting: Family Medicine

## 2024-02-17 ENCOUNTER — Other Ambulatory Visit

## 2024-02-17 DIAGNOSIS — R972 Elevated prostate specific antigen [PSA]: Secondary | ICD-10-CM

## 2024-02-17 MED ORDER — TRULICITY 1.5 MG/0.5ML ~~LOC~~ SOAJ: 1.5000 mg | SUBCUTANEOUS | 0 refills | Status: DC

## 2024-02-18 ENCOUNTER — Other Ambulatory Visit: Payer: Self-pay

## 2024-02-18 LAB — HEMATOCRIT: Hematocrit: 49 % (ref 37.5–51.0)

## 2024-02-20 ENCOUNTER — Encounter: Payer: Self-pay | Admitting: Urology

## 2024-02-20 ENCOUNTER — Ambulatory Visit (INDEPENDENT_AMBULATORY_CARE_PROVIDER_SITE_OTHER): Payer: Self-pay | Admitting: Urology

## 2024-02-20 VITALS — BP 132/82 | HR 68 | Ht 69.0 in | Wt 188.0 lb

## 2024-02-20 DIAGNOSIS — R972 Elevated prostate specific antigen [PSA]: Secondary | ICD-10-CM

## 2024-02-20 NOTE — Progress Notes (Signed)
 I, Maysun Jamey Mccallum, acting as a scribe for Geraline Knapp, MD., have documented all relevant documentation on the behalf of Geraline Knapp, MD, as directed by Geraline Knapp, MD while in the presence of Geraline Knapp, MD.  02/20/2024 10:40 PM   Jesse Dunlap 02/18/1966 [id number]  Referring provider: Solomon Dupre, DO [address] [place],  [place] [address]  Chief Complaint  Patient presents with   Elevated PSA   Urologic History: 1. Elevated PSA Intermittent PSA elevation dating back to June 2021 MRI 10/2022 PSA 5.1 with no abnormalities suspicious for high-grade prostate cancer or iindeterminate lesions.  HPI: Jesse Dunlap is a 58 y.o. male presents for annual follow-up.  No complaint since last visit. No bothersome lower urinary tract symptoms. Denies dysuria, gross hematuria Denies flank, abdominal, or pelvic pain. PSA 10/25/2023 checked by PCP had increased to 6.9 and was repeated 01/23/24 and persistently elevated above baseline at 6.7  PSA trend  Prostate Specific Ag, Serum  Latest Ref Rng 0.0 - 4.0 ng/mL  04/26/2020 5.0 (H)   05/27/2020 4.7 (H)   10/26/2020 3.9   05/04/2021 4.4 (H)   11/01/2021 4.4 (H)   06/29/2022 5.2 (H)   08/08/2022 5.1 (H)   02/07/2023 4.6 (H)   03/11/2023 5.1 (H)   10/25/2023 6.9 (H)   01/23/2024 6.7 (H)     PMH: Past Medical History:  Diagnosis Date   Attention deficit disorder    Diabetes mellitus without complication (HCC)    prior to gastric sleeve and wt loss   Family history of breast cancer    Hyperlipidemia    Hypertension    prior to gastric sleeve and wt loss   Hypogonadism in male    Obesity (BMI 35.0-39.9 without comorbidity)    OSA on CPAP     Surgical History: Past Surgical History:  Procedure Laterality Date   CARDIAC CATHETERIZATION  approx 2000   COLONOSCOPY  approx 2005   had polyp   COLONOSCOPY WITH PROPOFOL  N/A 03/16/2016   Procedure: COLONOSCOPY WITH PROPOFOL ;  Surgeon: Marnee Sink, MD;  Location: [facility];  Service: Endoscopy;  Laterality: N/A;  CPAP   COLONOSCOPY WITH PROPOFOL  N/A 02/29/2020   Procedure: COLONOSCOPY WITH BIOPSYr;  Surgeon: Marnee Sink, MD;  Location: [facility];  Service: Endoscopy;  Laterality: N/A;  sleep apnea Priority 3   LAPAROSCOPIC GASTRIC SLEEVE RESECTION  04/2013   POLYPECTOMY  03/16/2016   Procedure: POLYPECTOMY;  Surgeon: Marnee Sink, MD;  Location: [facility];  Service: Endoscopy;;   POLYPECTOMY N/A 02/29/2020   Procedure: POLYPECTOMY;  Surgeon: Marnee Sink, MD;  Location: [facility];  Service: Endoscopy;  Laterality: N/A;   RHINOPLASTY      Home Medications:  Allergies as of 02/20/2024       Reactions   Tramadol Itching        Medication List        Accurate as of February 20, 2024 10:40 PM. If you have any questions, ask your nurse or doctor.          albuterol  108 (90 Base) MCG/ACT inhaler Commonly known as: VENTOLIN  HFA Inhale 2 puffs into the lungs every 6 (six) hours as needed for wheezing or shortness of breath.   amLODipine  10 MG tablet Commonly known as: NORVASC  TAKE 1 TABLET BY MOUTH DAILY   aspirin EC 81 MG tablet Take 81 mg by mouth daily.   benazepril  40 MG tablet Commonly known as: LOTENSIN   TAKE 1 TABLET BY MOUTH DAILY   cyclobenzaprine  10 MG tablet Commonly known as: FLEXERIL  Take 0.5-1 tablets (5-10 mg total) by mouth 3 (three) times daily as needed for muscle spasms.   hydrochlorothiazide  25 MG tablet Commonly known as: HYDRODIURIL  TAKE 1 TABLET BY MOUTH DAILY   metFORMIN  500 MG 24 hr tablet Commonly known as: GLUCOPHAGE -XR TAKE 2 TABLETS BY MOUTH TWICE A DAY WITH A MEAL What changed: See the new instructions.   naproxen  500 MG tablet Commonly known as: NAPROSYN  Take 1 tablet (500 mg total) by mouth 2 (two) times daily with a meal.   Testosterone  1.62 % Gel Place 3 Pump onto the skin daily.   Trulicity 1.5 MG/0.5ML Soaj Generic drug: Dulaglutide Inject 1.5 mg into the  skin once a week. Please apply manufacturer copay card: [id number] pcn PDMI grp [id number] id [id number]   venlafaxine  XR 150 MG 24 hr capsule Commonly known as: EFFEXOR -XR TAKE 1 CAPSULE BY MOUTH ONCE DAILY WITH BREAKFAST AS DIRECTED ALONG WITH 75MG  = 225MG  What changed: See the new instructions.        Allergies:  Allergies  Allergen Reactions   Tramadol Itching    Family History: Family History  Problem Relation Age of Onset   Diabetes Mother    Breast cancer Mother 35       CHEK2 pos   Atrial fibrillation Mother    Thyroid disease Mother    Diabetes Father    Hypertension Father    Skin cancer Father    Breast cancer Sister 81   Alzheimer's disease Maternal Grandmother    Breast cancer Maternal Grandmother        dx over 72   Prostate cancer Maternal Grandfather        dx in his 63s-70s   Lung cancer Paternal Grandfather    Prostate cancer Other        PGF's father    Social History:  reports that he quit smoking about 37 years ago. His smoking use included cigarettes. He started smoking about 47 years ago. He has a 15 pack-year smoking history. He has been exposed to tobacco smoke. He has never used smokeless tobacco. He reports current alcohol use of about 4.0 - 5.0 standard drinks of alcohol per week. He reports that he does not use drugs.   Physical Exam: BP 132/82   Pulse 68   Ht 5\' 9"  (1.753 m)   Wt 188 lb (85.3 kg)   BMI 27.76 kg/m   Constitutional:  Alert and oriented, No acute distress. HEENT:  AT Respiratory: Normal respiratory effort, no increased work of breathing. GU: Prostate 35 grams, smooth without nodules. Aaron Aas Psychiatric: Normal mood and affect.   Assessment & Plan:    1. Elevated PSA Significant PSA rise from baseline.  Benign DRE Recommend repeat prostate MRI. Order placed, and will notify with the results. If MRI shows no suspicious lesions, but PSA density is abnormal,  recommend standard prostate biopsy.  I have reviewed the  above documentation for accuracy and completeness, and I agree with the above.   Geraline Knapp, MD  [facility] [address], [address] [place], [place] [address] [phone]

## 2024-02-21 ENCOUNTER — Ambulatory Visit: Payer: Managed Care, Other (non HMO) | Admitting: Urology

## 2024-02-24 ENCOUNTER — Telehealth: Payer: Self-pay

## 2024-02-24 NOTE — Progress Notes (Signed)
   02/24/2024  Patient ID: Jesse Dunlap, male   DOB: October 25, 1966, 58 y.o.   MRN: [id number]  Contacted [facility], and Trulicity will be >$700 with insurance and copay card; so patient unable to fill/pick up medication.  Since insurance coverage will limit medication options to generics, I recommend patient resume metformin  at 1000mg  BID dosing- sending patient MyChart message to inform.  Patient sees Dr. Lincoln Renshaw again next month, and I recommend discussing addition of statin medication for ASCVD risk reduction based on last LDL of 113.  If started, f/u lipid panel and LFT's suggested in 12 weeks.  Linn Rich, PharmD, DPLA

## 2024-03-11 ENCOUNTER — Other Ambulatory Visit: Payer: Self-pay | Admitting: Family Medicine

## 2024-03-11 DIAGNOSIS — I10 Essential (primary) hypertension: Secondary | ICD-10-CM

## 2024-03-13 NOTE — Telephone Encounter (Signed)
 Requested Prescriptions  Pending Prescriptions Disp Refills   benazepril  (LOTENSIN ) 40 MG tablet [Pharmacy Med Name: BENAZEPRIL  HCL 40 MG TABLET] 90 tablet 0    Sig: TAKE 1 TABLET BY MOUTH DAILY     Cardiovascular:  ACE Inhibitors Passed - 03/13/2024  9:24 AM      Passed - Cr in normal range and within 180 days    Creatinine  Date Value Ref Range Status  11/28/2012 0.98 0.60 - 1.30 mg/dL Final   Creatinine, Ser  Date Value Ref Range Status  01/23/2024 0.95 0.76 - 1.27 mg/dL Final         Passed - K in normal range and within 180 days    Potassium  Date Value Ref Range Status  01/23/2024 3.8 3.5 - 5.2 mmol/L Final  11/28/2012 3.9 3.5 - 5.1 mmol/L Final         Passed - Patient is not pregnant      Passed - Last BP in normal range    BP Readings from Last 1 Encounters:  02/20/24 132/82         Passed - Valid encounter within last 6 months    Recent Outpatient Visits           1 month ago Acute left-sided back pain, unspecified back location   [facility] [place], Megan P, DO   1 month ago Routine general medical examination at a health care facility   [facility] [place], [place] P, DO       Future Appointments             In 4 months Lincoln Renshaw, Jerilee Montane, DO [facility]             amLODipine  (NORVASC ) 10 MG tablet [Pharmacy Med Name: amLODIPine  BESYLATE 10MG  TAB] 90 tablet 0    Sig: TAKE 1 TABLET BY MOUTH DAILY     Cardiovascular: Calcium Channel Blockers 2 Passed - 03/13/2024  9:24 AM      Passed - Last BP in normal range    BP Readings from Last 1 Encounters:  02/20/24 132/82         Passed - Last Heart Rate in normal range    Pulse Readings from Last 1 Encounters:  02/20/24 68         Passed - Valid encounter within last 6 months    Recent Outpatient Visits           1 month ago Acute left-sided back pain, unspecified back location   [facility]  [place], Megan P, DO   1 month ago Routine general medical examination at a health care facility   [facility] [place], [place] P, DO       Future Appointments             In 4 months Lincoln Renshaw, Jerilee Montane, DO [place] [facility]             hydrochlorothiazide  (HYDRODIURIL ) 25 MG tablet [Pharmacy Med Name: hydroCHLOROthiazide  25 MG TABLET] 90 tablet 0    Sig: TAKE 1 TABLET BY MOUTH DAILY     Cardiovascular: Diuretics - Thiazide Passed - 03/13/2024  9:24 AM      Passed - Cr in normal range and within 180 days    Creatinine  Date Value Ref Range Status  11/28/2012 0.98 0.60 - 1.30 mg/dL Final   Creatinine, Ser  Date Value  Ref Range Status  01/23/2024 0.95 0.76 - 1.27 mg/dL Final         Passed - K in normal range and within 180 days    Potassium  Date Value Ref Range Status  01/23/2024 3.8 3.5 - 5.2 mmol/L Final  11/28/2012 3.9 3.5 - 5.1 mmol/L Final         Passed - Na in normal range and within 180 days    Sodium  Date Value Ref Range Status  01/23/2024 140 134 - 144 mmol/L Final  11/28/2012 137 136 - 145 mmol/L Final         Passed - Last BP in normal range    BP Readings from Last 1 Encounters:  02/20/24 132/82         Passed - Valid encounter within last 6 months    Recent Outpatient Visits           1 month ago Acute left-sided back pain, unspecified back location   [facility] [place], Megan P, DO   1 month ago Routine general medical examination at a health care facility   [facility] Solomon Dupre, DO       Future Appointments             In 4 months Solomon Dupre, DO [place] [facility]

## 2024-03-14 LAB — HM DIABETES EYE EXAM

## 2024-03-21 ENCOUNTER — Other Ambulatory Visit: Payer: Self-pay | Admitting: Family Medicine

## 2024-03-23 ENCOUNTER — Other Ambulatory Visit: Payer: Self-pay

## 2024-03-23 DIAGNOSIS — I10 Essential (primary) hypertension: Secondary | ICD-10-CM

## 2024-03-24 NOTE — Telephone Encounter (Signed)
 Requested medication (s) are due for refill today - yes  Requested medication (s) are on the active medication list -yes  Future visit scheduled -yes  Last refill: 09/09/23 #90 1RF  Notes to clinic: Sent for review - directions don't match medication list- no 75 mg prescribed   Requested Prescriptions  Pending Prescriptions Disp Refills   venlafaxine  XR (EFFEXOR -XR) 150 MG 24 hr capsule [Pharmacy Med Name: VENLAFAXINE  HCL ER 150 MG CAP] 90 capsule 1    Sig: TAKE 1 CAPSULE BY MOUTH ONCE DAILY WITH BREAKFAST AS DIRECTED ALONG WITH 75MG  = 225MG      Psychiatry: Antidepressants - SNRI - desvenlafaxine & venlafaxine  Failed - 03/24/2024 10:46 AM      Failed - Lipid Panel in normal range within the last 12 months    Cholesterol, Total  Date Value Ref Range Status  01/23/2024 205 (H) 100 - 199 mg/dL Final   Cholesterol Piccolo, Waived  Date Value Ref Range Status  05/29/2018 214 (H) <200 mg/dL Final    Comment:                            Desirable                <200                         Borderline High      200- 239                         High                     >239    LDL Chol Calc (NIH)  Date Value Ref Range Status  01/23/2024 113 (H) 0 - 99 mg/dL Final   HDL  Date Value Ref Range Status  01/23/2024 65 >39 mg/dL Final   Triglycerides  Date Value Ref Range Status  01/23/2024 157 (H) 0 - 149 mg/dL Final   Triglycerides Piccolo,Waived  Date Value Ref Range Status  05/29/2018 253 (H) <150 mg/dL Final    Comment:                            Normal                   <150                         Borderline High     150 - 199                         High                200 - 499                         Very High                >499          Passed - Cr in normal range and within 360 days    Creatinine  Date Value Ref Range Status  11/28/2012 0.98 0.60 - 1.30 mg/dL Final   Creatinine, Ser  Date Value Ref Range Status  01/23/2024 0.95 0.76 - 1.27 mg/dL Final  Passed - Completed PHQ-2 or PHQ-9 in the last 360 days      Passed - Last BP in normal range    BP Readings from Last 1 Encounters:  02/20/24 132/82         Passed - Valid encounter within last 6 months    Recent Outpatient Visits           1 month ago Acute left-sided back pain, unspecified back location   [facility] [place], Megan P, DO   2 months ago Routine general medical examination at a health care facility   [facility] Solomon Dupre, DO       Future Appointments             In 4 months Lincoln Renshaw, Jerilee Montane, DO [place] [facility], [facility]               Requested Prescriptions  Pending Prescriptions Disp Refills   venlafaxine  XR (EFFEXOR -XR) 150 MG 24 hr capsule [Pharmacy Med Name: VENLAFAXINE  HCL ER 150 MG CAP] 90 capsule 1    Sig: TAKE 1 CAPSULE BY MOUTH ONCE DAILY WITH BREAKFAST AS DIRECTED ALONG WITH 75MG  = 225MG      Psychiatry: Antidepressants - SNRI - desvenlafaxine & venlafaxine  Failed - 03/24/2024 10:46 AM      Failed - Lipid Panel in normal range within the last 12 months    Cholesterol, Total  Date Value Ref Range Status  01/23/2024 205 (H) 100 - 199 mg/dL Final   Cholesterol Piccolo, Waived  Date Value Ref Range Status  05/29/2018 214 (H) <200 mg/dL Final    Comment:                            Desirable                <200                         Borderline High      200- 239                         High                     >239    LDL Chol Calc (NIH)  Date Value Ref Range Status  01/23/2024 113 (H) 0 - 99 mg/dL Final   HDL  Date Value Ref Range Status  01/23/2024 65 >39 mg/dL Final   Triglycerides  Date Value Ref Range Status  01/23/2024 157 (H) 0 - 149 mg/dL Final   Triglycerides Piccolo,Waived  Date Value Ref Range Status  05/29/2018 253 (H) <150 mg/dL Final    Comment:                            Normal                   <150                         Borderline High      150 - 199                         High  200 - 499                         Very High                >499          Passed - Cr in normal range and within 360 days    Creatinine  Date Value Ref Range Status  11/28/2012 0.98 0.60 - 1.30 mg/dL Final   Creatinine, Ser  Date Value Ref Range Status  01/23/2024 0.95 0.76 - 1.27 mg/dL Final         Passed - Completed PHQ-2 or PHQ-9 in the last 360 days      Passed - Last BP in normal range    BP Readings from Last 1 Encounters:  02/20/24 132/82         Passed - Valid encounter within last 6 months    Recent Outpatient Visits           1 month ago Acute left-sided back pain, unspecified back location   [facility] [place], Megan P, DO   2 months ago Routine general medical examination at a health care facility   [facility] Solomon Dupre, DO       Future Appointments             In 4 months Solomon Dupre, DO [place] [facility], [facility]

## 2024-03-27 ENCOUNTER — Encounter: Payer: Self-pay | Admitting: Family Medicine

## 2024-03-27 ENCOUNTER — Ambulatory Visit (INDEPENDENT_AMBULATORY_CARE_PROVIDER_SITE_OTHER): Admitting: Family Medicine

## 2024-03-27 VITALS — BP 122/75 | HR 84 | Temp 98.3°F | Resp 18 | Ht 69.02 in | Wt 186.6 lb

## 2024-03-27 DIAGNOSIS — M545 Low back pain, unspecified: Secondary | ICD-10-CM

## 2024-03-27 DIAGNOSIS — N182 Chronic kidney disease, stage 2 (mild): Secondary | ICD-10-CM

## 2024-03-27 DIAGNOSIS — E1122 Type 2 diabetes mellitus with diabetic chronic kidney disease: Secondary | ICD-10-CM | POA: Diagnosis not present

## 2024-03-27 LAB — BAYER DCA HB A1C WAIVED: HB A1C (BAYER DCA - WAIVED): 5.5 % (ref 4.8–5.6)

## 2024-03-27 MED ORDER — VENLAFAXINE HCL ER 150 MG PO CP24: 150.0000 mg | ORAL_CAPSULE | Freq: Every day | ORAL | 1 refills | Status: DC

## 2024-03-27 NOTE — Assessment & Plan Note (Signed)
 Doing great with A1c of 5.5! Continue current regimen. Recheck 3 months. Call with any concerns.

## 2024-03-27 NOTE — Assessment & Plan Note (Signed)
 Resolved. Feeling like himself. Call with any concerns.

## 2024-03-27 NOTE — Progress Notes (Signed)
 BP 122/75 (BP Location: Left Arm, Patient Position: Sitting, Cuff Size: Large)   Pulse 84   Temp 98.3 F (36.8 C) (Oral)   Resp 18   Ht 5' 9.02" (1.753 m)   Wt 186 lb 9.6 oz (84.6 kg)   SpO2 97%   BMI 27.54 kg/m    Subjective:    Patient ID: Jesse Dunlap, male    DOB: [birth year], 58 y.o.   MRN: [id number]  HPI: Zale Marcotte is a 58 y.o. male  Chief Complaint  Patient presents with   Hypertension    No home checks but no issues as well.   Diabetes    No home checks but no issues as well. Stopped Mounjaro due to costs 2-3 months.    BACK PAIN- resolved. Feeling back to himself  DIABETES- has only been on his metformin  due to insurance issues Hypoglycemic episodes:no Polydipsia/polyuria: no Visual disturbance: no Chest pain: no Paresthesias: no Glucose Monitoring: yes  Accucheck frequency: occasionally Taking Insulin?: no Blood Pressure Monitoring: rarely Retinal Examination: Up to Date Foot Exam: Up to Date Diabetic Education: Completed Pneumovax: Up to Date Influenza: Up to Date Aspirin: yes  Relevant past medical, surgical, family and social history reviewed and updated as indicated. Interim medical history since our last visit reviewed. Allergies and medications reviewed and updated.  Review of Systems  Constitutional: Negative.   Respiratory: Negative.    Cardiovascular: Negative.   Gastrointestinal: Negative.   Musculoskeletal: Negative.   Neurological: Negative.   Psychiatric/Behavioral: Negative.      Per HPI unless specifically indicated above     Objective:     BP 122/75 (BP Location: Left Arm, Patient Position: Sitting, Cuff Size: Large)   Pulse 84   Temp 98.3 F (36.8 C) (Oral)   Resp 18   Ht 5' 9.02" (1.753 m)   Wt 186 lb 9.6 oz (84.6 kg)   SpO2 97%   BMI 27.54 kg/m   Wt Readings from Last 3 Encounters:  03/27/24 186 lb 9.6 oz (84.6 kg)  02/20/24 188 lb (85.3 kg)  02/11/24 188 lb 3.2 oz (85.4 kg)    Physical Exam Vitals  and nursing note reviewed.  Constitutional:      General: He is not in acute distress.    Appearance: Normal appearance. He is not ill-appearing, toxic-appearing or diaphoretic.  HENT:     Head: Normocephalic and atraumatic.     Right Ear: External ear normal.     Left Ear: External ear normal.     Nose: Nose normal.     Mouth/Throat:     Mouth: Mucous membranes are moist.     Pharynx: Oropharynx is clear.  Eyes:     General: No scleral icterus.       Right eye: No discharge.        Left eye: No discharge.     Extraocular Movements: Extraocular movements intact.     Conjunctiva/sclera: Conjunctivae normal.     Pupils: Pupils are equal, round, and reactive to light.  Cardiovascular:     Rate and Rhythm: Normal rate and regular rhythm.     Pulses: Normal pulses.     Heart sounds: Normal heart sounds. No murmur heard.    No friction rub. No gallop.  Pulmonary:     Effort: Pulmonary effort is normal. No respiratory distress.     Breath sounds: Normal breath sounds. No stridor. No wheezing, rhonchi or rales.  Chest:     Chest wall: No tenderness.  Musculoskeletal:  General: Normal range of motion.     Cervical back: Normal range of motion and neck supple.  Skin:    General: Skin is warm and dry.     Capillary Refill: Capillary refill takes less than 2 seconds.     Coloration: Skin is not jaundiced or pale.     Findings: No bruising, erythema, lesion or rash.  Neurological:     General: No focal deficit present.     Mental Status: He is alert and oriented to person, place, and time. Mental status is at baseline.  Psychiatric:        Mood and Affect: Mood normal.        Behavior: Behavior normal.        Thought Content: Thought content normal.        Judgment: Judgment normal.     Results for orders placed or performed in visit on 03/18/24  HM DIABETES EYE EXAM   Collection Time: 03/14/24 11:36 AM  Result Value Ref Range   HM Diabetic Eye Exam No Retinopathy No  Retinopathy      Assessment & Plan:   Problem List Items Addressed This Visit       Endocrine   Type 2 diabetes mellitus with stage 2 chronic kidney disease, without long-term current use of insulin (HCC) - Primary   Doing great with A1c of 5.5! Continue current regimen. Recheck 3 months. Call with any concerns.       Relevant Orders   Bayer DCA Hb A1c Waived     Other   Low back pain   Resolved. Feeling like himself. Call with any concerns.         Follow up plan: Return in about 3 months (around 06/27/2024).

## 2024-04-29 ENCOUNTER — Ambulatory Visit: Admitting: Family Medicine

## 2024-06-11 ENCOUNTER — Other Ambulatory Visit: Payer: Self-pay | Admitting: Family Medicine

## 2024-06-11 DIAGNOSIS — I10 Essential (primary) hypertension: Secondary | ICD-10-CM

## 2024-06-12 ENCOUNTER — Other Ambulatory Visit: Payer: Self-pay

## 2024-06-12 DIAGNOSIS — I10 Essential (primary) hypertension: Secondary | ICD-10-CM

## 2024-06-13 NOTE — Telephone Encounter (Signed)
 Requested Prescriptions  Pending Prescriptions Disp Refills   amLODipine  (NORVASC ) 10 MG tablet [Pharmacy Med Name: amLODIPine  BESYLATE 10MG  TAB] 90 tablet 0    Sig: TAKE 1 TABLET BY MOUTH DAILY     Cardiovascular: Calcium Channel Blockers 2 Passed - 06/13/2024  8:26 PM      Passed - Last BP in normal range    BP Readings from Last 1 Encounters:  03/27/24 122/75         Passed - Last Heart Rate in normal range    Pulse Readings from Last 1 Encounters:  03/27/24 84         Passed - Valid encounter within last 6 months    Recent Outpatient Visits           2 months ago Type 2 diabetes mellitus with stage 2 chronic kidney disease, without long-term current use of insulin (HCC)   [place] [facility] [place], Megan P, DO   4 months ago Acute left-sided back pain, unspecified back location   [place] [facility] [place], Megan P, DO   4 months ago Routine general medical examination at a health care facility   [facility] [place], [place] P, DO       Future Appointments             In 1 month Johnson, Duwaine SQUIBB, DO [place] [facility], [facility]             hydrochlorothiazide  (HYDRODIURIL ) 25 MG tablet [Pharmacy Med Name: hydroCHLOROthiazide  25 MG TABLET] 90 tablet 0    Sig: TAKE 1 TABLET BY MOUTH DAILY     Cardiovascular: Diuretics - Thiazide Passed - 06/13/2024  8:26 PM      Passed - Cr in normal range and within 180 days    Creatinine  Date Value Ref Range Status  11/28/2012 0.98 0.60 - 1.30 mg/dL Final   Creatinine, Ser  Date Value Ref Range Status  01/23/2024 0.95 0.76 - 1.27 mg/dL Final         Passed - K in normal range and within 180 days    Potassium  Date Value Ref Range Status  01/23/2024 3.8 3.5 - 5.2 mmol/L Final  11/28/2012 3.9 3.5 - 5.1 mmol/L Final         Passed - Na in normal range and within 180 days    Sodium  Date Value Ref Range Status  01/23/2024 140 134 - 144 mmol/L Final   11/28/2012 137 136 - 145 mmol/L Final         Passed - Last BP in normal range    BP Readings from Last 1 Encounters:  03/27/24 122/75         Passed - Valid encounter within last 6 months    Recent Outpatient Visits           2 months ago Type 2 diabetes mellitus with stage 2 chronic kidney disease, without long-term current use of insulin (HCC)   [place] [facility] [place], Megan P, DO   4 months ago Acute left-sided back pain, unspecified back location   [facility] [place], Megan P, DO   4 months ago Routine general medical examination at a health care facility   [facility] [place] Du[place]aine SQUIBB, DO       Future Appointments             In 1 month [place], [place],  DO [place] [facility], [facility]             benazepril  (LOTENSIN ) 40 MG tablet [Pharmacy Med Name: BENAZEPRIL  HCL 40 MG TABLET] 90 tablet 0    Sig: TAKE 1 TABLET BY MOUTH DAILY     Cardiovascular:  ACE Inhibitors Passed - 06/13/2024  8:26 PM      Passed - Cr in normal range and within 180 days    Creatinine  Date Value Ref Range Status  11/28/2012 0.98 0.60 - 1.30 mg/dL Final   Creatinine, Ser  Date Value Ref Range Status  01/23/2024 0.95 0.76 - 1.27 mg/dL Final         Passed - K in normal range and within 180 days    Potassium  Date Value Ref Range Status  01/23/2024 3.8 3.5 - 5.2 mmol/L Final  11/28/2012 3.9 3.5 - 5.1 mmol/L Final         Passed - Patient is not pregnant      Passed - Last BP in normal range    BP Readings from Last 1 Encounters:  03/27/24 122/75         Passed - Valid encounter within last 6 months    Recent Outpatient Visits           2 months ago Type 2 diabetes mellitus with stage 2 chronic kidney disease, without long-term current use of insulin (HCC)   [place] [facility] [place], Megan P, DO   4 months ago Acute left-sided back pain, unspecified back location   [facility] [place], Megan P, DO   4 months ago Routine general medical examination at a health care facility   [facility] [place] Duwaine SQUIBB, DO       Future Appointments             In 1 month [place], Duwaine SQUIBB, DO  [facility]

## 2024-07-15 ENCOUNTER — Other Ambulatory Visit (HOSPITAL_COMMUNITY): Payer: Self-pay

## 2024-07-15 ENCOUNTER — Telehealth: Payer: Self-pay

## 2024-07-15 NOTE — Telephone Encounter (Signed)
 Pharmacy Patient Advocate Encounter   Received notification from Onbase that prior authorization for Testosterone  1.62% gel  is required/requested.   Insurance verification completed.   The patient is insured through [facility] .   Per test claim: PA required; PA submitted to above mentioned insurance via Latent Key/confirmation #/EOC [facility]   Status is pending

## 2024-07-17 ENCOUNTER — Other Ambulatory Visit (HOSPITAL_COMMUNITY): Payer: Self-pay

## 2024-07-17 NOTE — Telephone Encounter (Signed)
 Pharmacy Patient Advocate Encounter  Received notification from [facility] that Prior Authorization for Testosterone  1.62% gel has been APPROVED to 9.10.26. Ran test claim, Copay is $RTS, RX LAST FILLED ON 9.10.25. This test claim was processed through [facility]- copay amounts may vary at other pharmacies due to pharmacy/plan contracts, or as the patient moves through the different stages of their insurance plan.   PA #/Case ID/Reference #: [id number]

## 2024-07-20 ENCOUNTER — Other Ambulatory Visit (HOSPITAL_COMMUNITY): Payer: Self-pay

## 2024-07-31 ENCOUNTER — Ambulatory Visit: Payer: Self-pay | Admitting: Family Medicine

## 2024-07-31 ENCOUNTER — Encounter: Payer: Self-pay | Admitting: Family Medicine

## 2024-07-31 VITALS — BP 133/82 | HR 74 | Temp 98.0°F | Ht 69.0 in | Wt 178.8 lb

## 2024-07-31 DIAGNOSIS — E1122 Type 2 diabetes mellitus with diabetic chronic kidney disease: Secondary | ICD-10-CM

## 2024-07-31 DIAGNOSIS — E7800 Pure hypercholesterolemia, unspecified: Secondary | ICD-10-CM

## 2024-07-31 DIAGNOSIS — N401 Enlarged prostate with lower urinary tract symptoms: Secondary | ICD-10-CM | POA: Diagnosis not present

## 2024-07-31 DIAGNOSIS — F321 Major depressive disorder, single episode, moderate: Secondary | ICD-10-CM

## 2024-07-31 DIAGNOSIS — I10 Essential (primary) hypertension: Secondary | ICD-10-CM

## 2024-07-31 DIAGNOSIS — Z7984 Long term (current) use of oral hypoglycemic drugs: Secondary | ICD-10-CM | POA: Diagnosis not present

## 2024-07-31 DIAGNOSIS — E291 Testicular hypofunction: Secondary | ICD-10-CM

## 2024-07-31 DIAGNOSIS — R3911 Hesitancy of micturition: Secondary | ICD-10-CM

## 2024-07-31 DIAGNOSIS — N182 Chronic kidney disease, stage 2 (mild): Secondary | ICD-10-CM | POA: Diagnosis not present

## 2024-07-31 DIAGNOSIS — Z23 Encounter for immunization: Secondary | ICD-10-CM | POA: Diagnosis not present

## 2024-07-31 DIAGNOSIS — Z0000 Encounter for general adult medical examination without abnormal findings: Secondary | ICD-10-CM

## 2024-07-31 LAB — MICROALBUMIN, URINE WAIVED
Creatinine, Urine Waived: 200 mg/dL (ref 10–300)
Microalb, Ur Waived: 30 mg/L — ABNORMAL HIGH (ref 0–19)
Microalb/Creat Ratio: <30 mg/g (ref <30)

## 2024-07-31 LAB — BAYER DCA HB A1C WAIVED: HB A1C (BAYER DCA - WAIVED): 5.6 % (ref 4.8–5.6)

## 2024-07-31 MED ORDER — METFORMIN HCL ER 500 MG PO TB24: 500.0000 mg | ORAL_TABLET | Freq: Every day | ORAL | 1 refills | Status: AC

## 2024-07-31 MED ORDER — HYDROCHLOROTHIAZIDE 25 MG PO TABS: 25.0000 mg | ORAL_TABLET | Freq: Every day | ORAL | 1 refills | Status: AC

## 2024-07-31 MED ORDER — NAPROXEN 500 MG PO TABS: 500.0000 mg | ORAL_TABLET | Freq: Two times a day (BID) | ORAL | 1 refills | Status: AC

## 2024-07-31 MED ORDER — CYCLOBENZAPRINE HCL 10 MG PO TABS: 5.0000 mg | ORAL_TABLET | Freq: Three times a day (TID) | ORAL | 1 refills | Status: AC | PRN

## 2024-07-31 MED ORDER — AMLODIPINE BESYLATE 10 MG PO TABS: 10.0000 mg | ORAL_TABLET | Freq: Every day | ORAL | 1 refills | Status: AC

## 2024-07-31 MED ORDER — BENAZEPRIL HCL 40 MG PO TABS: 40.0000 mg | ORAL_TABLET | Freq: Every day | ORAL | 1 refills | Status: AC

## 2024-07-31 MED ORDER — VENLAFAXINE HCL ER 150 MG PO CP24: 150.0000 mg | ORAL_CAPSULE | Freq: Every day | ORAL | 1 refills | Status: AC

## 2024-07-31 NOTE — Assessment & Plan Note (Signed)
 Under good control on current regimen. Continue current regimen. Continue to monitor. Call with any concerns. Refills given. Labs drawn today.

## 2024-07-31 NOTE — Assessment & Plan Note (Signed)
 Doing well with A1c of 5.6. Will cut his metformin  to 500mg  daily. Recheck 6 months.

## 2024-07-31 NOTE — Assessment & Plan Note (Signed)
 Rechecking labs today. Await results. Treat as needed.

## 2024-07-31 NOTE — Progress Notes (Signed)
 BP 133/82 (BP Location: Left Arm, Patient Position: Sitting, Cuff Size: Normal)   Pulse 74   Temp 98 F (36.7 C) (Oral)   Ht 5' 9 (1.753 m)   Wt 178 lb 12.8 oz (81.1 kg)   SpO2 98%   BMI 26.40 kg/m    Subjective:    Patient ID: Jesse Dunlap, male    DOB: 11/17/1965, 58 y.o.   MRN: [id number]  HPI: Jesse Dunlap is a 58 y.o. male presenting on 07/31/2024 for comprehensive medical examination. Current medical complaints include:  DIABETES Hypoglycemic episodes:no Polydipsia/polyuria: no Visual disturbance: no Chest pain: no Paresthesias: no Glucose Monitoring: no Taking Insulin?: no Blood Pressure Monitoring: not checking Retinal Examination: Up to Date Foot Exam: Up to Date Diabetic Education: Completed Pneumovax: Up to Date Influenza: Not up to Date Aspirin: yes  HYPERTENSION / HYPERLIPIDEMIA Satisfied with current treatment? yes Duration of hypertension: chronic BP monitoring frequency: not checking BP medication side effects: no Past BP meds: benazepril , amlodipine , HCTZ Duration of hyperlipidemia: chronic Cholesterol medication side effects: no Cholesterol supplements: none Past cholesterol medications: none Medication compliance: excellent compliance Aspirin: yes Recent stressors: no Recurrent headaches: no Visual changes: no Palpitations: no Dyspnea: no Chest pain: no Lower extremity edema: no Dizzy/lightheaded: no  DEPRESSION Mood status: controlled Satisfied with current treatment?: yes Symptom severity: mild  Duration of current treatment : chronic Side effects: no Medication compliance: excellent compliance Psychotherapy/counseling: no  Previous psychiatric medications: effexor  Depressed mood: no Anxious mood: no Anhedonia: no Significant weight loss or gain: no Insomnia: no  Fatigue: no Feelings of worthlessness or guilt: no Impaired concentration/indecisiveness: no Suicidal ideations: no Hopelessness: no Crying spells: no     07/31/2024    8:18 AM 03/27/2024    9:20 AM 02/11/2024    3:37 PM 10/25/2023   11:26 AM 07/29/2023    1:25 PM  Depression screen PHQ 2/9  Decreased Interest 0 0 0 0 0  Down, Depressed, Hopeless 0 0 0 0 0  PHQ - 2 Score 0 0 0 0 0  Altered sleeping 0 0 0 0 0  Tired, decreased energy 0 0 0 0 3  Change in appetite 0 0 0 0 0  Feeling bad or failure about yourself  1 0 0 0 0  Trouble concentrating 0 0 0 0 0  Moving slowly or fidgety/restless 0 0 0 0 0  Suicidal thoughts 0 0 0 0 0  PHQ-9 Score 1 0 0 0 3  Difficult doing work/chores Not difficult at all   Not difficult at all Not difficult at all   Interim Problems from his last visit: no  Depression Screen done today and results listed below:     07/31/2024    8:18 AM 03/27/2024    9:20 AM 02/11/2024    3:37 PM 10/25/2023   11:26 AM 07/29/2023    1:25 PM  Depression screen PHQ 2/9  Decreased Interest 0 0 0 0 0  Down, Depressed, Hopeless 0 0 0 0 0  PHQ - 2 Score 0 0 0 0 0  Altered sleeping 0 0 0 0 0  Tired, decreased energy 0 0 0 0 3  Change in appetite 0 0 0 0 0  Feeling bad or failure about yourself  1 0 0 0 0  Trouble concentrating 0 0 0 0 0  Moving slowly or fidgety/restless 0 0 0 0 0  Suicidal thoughts 0 0 0 0 0  PHQ-9 Score 1 0 0 0 3  Difficult doing  work/chores Not difficult at all   Not difficult at all Not difficult at all    Past Medical History:  Past Medical History:  Diagnosis Date   Attention deficit disorder    Diabetes mellitus without complication (HCC)    prior to gastric sleeve and wt loss   Family history of breast cancer    Hyperlipidemia    Hypertension    prior to gastric sleeve and wt loss   Hypogonadism in male    Obesity (BMI 35.0-39.9 without comorbidity)    OSA on CPAP     Surgical History:  Past Surgical History:  Procedure Laterality Date   CARDIAC CATHETERIZATION  approx 2000   COLONOSCOPY  approx 2005   had polyp   COLONOSCOPY WITH PROPOFOL  N/A 03/16/2016   Procedure: COLONOSCOPY WITH  PROPOFOL ;  Surgeon: Rogelia Copping, MD;  Location: [facility];  Service: Endoscopy;  Laterality: N/A;  CPAP   COLONOSCOPY WITH PROPOFOL  N/A 02/29/2020   Procedure: COLONOSCOPY WITH BIOPSYr;  Surgeon: Copping Rogelia, MD;  Location: [facility];  Service: Endoscopy;  Laterality: N/A;  sleep apnea Priority 3   LAPAROSCOPIC GASTRIC SLEEVE RESECTION  04/2013   POLYPECTOMY  03/16/2016   Procedure: POLYPECTOMY;  Surgeon: Rogelia Copping, MD;  Location: [facility];  Service: Endoscopy;;   POLYPECTOMY N/A 02/29/2020   Procedure: POLYPECTOMY;  Surgeon: Copping Rogelia, MD;  Location: [facility];  Service: Endoscopy;  Laterality: N/A;   RHINOPLASTY      Medications:  Current Outpatient Medications on File Prior to Visit  Medication Sig   albuterol  (VENTOLIN  HFA) 108 (90 Base) MCG/ACT inhaler Inhale 2 puffs into the lungs every 6 (six) hours as needed for wheezing or shortness of breath.   aspirin EC 81 MG tablet Take 81 mg by mouth daily.   Testosterone  1.62 % GEL Place 3 Pump onto the skin daily.   No current facility-administered medications on file prior to visit.    Allergies:  Allergies  Allergen Reactions   Tramadol Itching    Social History:  Social History   Socioeconomic History   Marital status: Married    Spouse name: Amy   Number of children: 1   Years of education: Not on file   Highest education level: Some college, no degree  Occupational History   Not on file  Tobacco Use   Smoking status: Former    Current packs/day: 0.00    Average packs/day: 1.5 packs/day for 10.0 years (15.0 ttl pk-yrs)    Types: Cigarettes    Start date: 11/05/1976    Quit date: 11/05/1986    Years since quitting: 37.7    Passive exposure: Past   Smokeless tobacco: Never  Vaping Use   Vaping status: Never Used  Substance and Sexual Activity   Alcohol use: Yes    Alcohol/week: 4.0 - 5.0 standard drinks of alcohol    Types: 4 - 5 Glasses of wine per week    Comment:      Drug use: No   Sexual activity: Not Currently  Other Topics Concern   Not on file  Social History Narrative   Not on file   Social Drivers of Health   Financial Resource Strain: Low Risk  (01/23/2024)   Overall Financial Resource Strain (CARDIA)    Difficulty of Paying Living Expenses: Not very hard  Food Insecurity: Not on file  Transportation Needs: Not on file  Physical Activity: Insufficiently Active (01/23/2024)   Exercise Vital Sign    Days  of Exercise per Week: 1 day    Minutes of Exercise per Session: 40 min  Stress: No Stress Concern Present (01/23/2024)   [employer] of Occupational Health - Occupational Stress Questionnaire    Feeling of Stress : Not at all  Social Connections: Moderately Integrated (01/23/2024)   Social Connection and Isolation Panel    Frequency of Communication with Friends and Family: Twice a week    Frequency of Social Gatherings with Friends and Family: Once a week    Attends Religious Services: 1 to 4 times per year    Active Member of [employer] or Organizations: No    Attends Engineer, structural: Never    Marital Status: Married  Catering manager Violence: Not on file   Social History   Tobacco Use  Smoking Status Former   Current packs/day: 0.00   Average packs/day: 1.5 packs/day for 10.0 years (15.0 ttl pk-yrs)   Types: Cigarettes   Start date: 11/05/1976   Quit date: 11/05/1986   Years since quitting: 37.7   Passive exposure: Past  Smokeless Tobacco Never   Social History   Substance and Sexual Activity  Alcohol Use Yes   Alcohol/week: 4.0 - 5.0 standard drinks of alcohol   Types: 4 - 5 Glasses of wine per week   Comment:      Family History:  Family History  Problem Relation Age of Onset   Diabetes Mother    Breast cancer Mother 73       CHEK2 pos   Atrial fibrillation Mother    Thyroid disease Mother    Diabetes Father    Hypertension Father    Skin cancer Father    Breast cancer Sister 42   Alzheimer's  disease Maternal Grandmother    Breast cancer Maternal Grandmother        dx over 74   Prostate cancer Maternal Grandfather        dx in his 51s-70s   Lung cancer Paternal Grandfather    Prostate cancer Other        PGF's father    Past medical history, surgical history, medications, allergies, family history and social history reviewed with patient today and changes made to appropriate areas of the chart.   Review of Systems  Constitutional: Negative.   HENT:  Positive for hearing loss. Negative for congestion, ear discharge, ear pain, nosebleeds, sinus pain, sore throat and tinnitus.   Eyes: Negative.   Respiratory: Negative.  Negative for stridor.   Cardiovascular: Negative.   Gastrointestinal: Negative.   Genitourinary:  Positive for urgency. Negative for dysuria, flank pain, frequency and hematuria.  Musculoskeletal: Negative.   Skin: Negative.   Neurological: Negative.   Endo/Heme/Allergies: Negative.   Psychiatric/Behavioral: Negative.     All other ROS negative except what is listed above and in the HPI.      Objective:    BP 133/82 (BP Location: Left Arm, Patient Position: Sitting, Cuff Size: Normal)   Pulse 74   Temp 98 F (36.7 C) (Oral)   Ht 5' 9 (1.753 m)   Wt 178 lb 12.8 oz (81.1 kg)   SpO2 98%   BMI 26.40 kg/m   Wt Readings from Last 3 Encounters:  07/31/24 178 lb 12.8 oz (81.1 kg)  03/27/24 186 lb 9.6 oz (84.6 kg)  02/20/24 188 lb (85.3 kg)    Physical Exam Vitals and nursing note reviewed.  Constitutional:      General: He is not in acute distress.    Appearance:  Normal appearance. He is normal weight. He is not ill-appearing, toxic-appearing or diaphoretic.  HENT:     Head: Normocephalic and atraumatic.     Right Ear: Tympanic membrane, ear canal and external ear normal. There is no impacted cerumen.     Left Ear: Tympanic membrane, ear canal and external ear normal. There is no impacted cerumen.     Nose: Nose normal. No congestion or  rhinorrhea.     Mouth/Throat:     Mouth: Mucous membranes are moist.     Pharynx: Oropharynx is clear. No oropharyngeal exudate or posterior oropharyngeal erythema.  Eyes:     General: No scleral icterus.       Right eye: No discharge.        Left eye: No discharge.     Extraocular Movements: Extraocular movements intact.     Conjunctiva/sclera: Conjunctivae normal.     Pupils: Pupils are equal, round, and reactive to light.  Neck:     Vascular: No carotid bruit.  Cardiovascular:     Rate and Rhythm: Normal rate and regular rhythm.     Pulses: Normal pulses.     Heart sounds: No murmur heard.    No friction rub. No gallop.  Pulmonary:     Effort: Pulmonary effort is normal. No respiratory distress.     Breath sounds: Normal breath sounds. No stridor. No wheezing, rhonchi or rales.  Chest:     Chest wall: No tenderness.  Abdominal:     General: Abdomen is flat. Bowel sounds are normal. There is no distension.     Palpations: Abdomen is soft. There is no mass.     Tenderness: There is no abdominal tenderness. There is no right CVA tenderness, left CVA tenderness, guarding or rebound.     Hernia: No hernia is present.  Genitourinary:    Comments: Genital exam deferred with shared decision making Musculoskeletal:        General: No swelling, tenderness, deformity or signs of injury.     Cervical back: Normal range of motion and neck supple. No rigidity. No muscular tenderness.     Right lower leg: No edema.     Left lower leg: No edema.  Lymphadenopathy:     Cervical: No cervical adenopathy.  Skin:    General: Skin is warm and dry.     Capillary Refill: Capillary refill takes less than 2 seconds.     Coloration: Skin is not jaundiced or pale.     Findings: No bruising, erythema, lesion or rash.  Neurological:     General: No focal deficit present.     Mental Status: He is alert and oriented to person, place, and time.     Cranial Nerves: No cranial nerve deficit.      Sensory: No sensory deficit.     Motor: No weakness.     Coordination: Coordination normal.     Gait: Gait normal.     Deep Tendon Reflexes: Reflexes normal.  Psychiatric:        Mood and Affect: Mood normal.        Behavior: Behavior normal.        Thought Content: Thought content normal.        Judgment: Judgment normal.     Results for orders placed or performed in visit on 03/27/24  Bayer DCA Hb A1c Waived   Collection Time: 03/27/24  9:28 AM  Result Value Ref Range   HB A1C (BAYER DCA - WAIVED) 5.5 4.8 - 5.6 %  Assessment & Plan:   Problem List Items Addressed This Visit       Cardiovascular and Mediastinum   Hypertension   Under good control on current regimen. Continue current regimen. Continue to monitor. Call with any concerns. Refills given. Labs drawn today.        Relevant Medications   amLODipine  (NORVASC ) 10 MG tablet   benazepril  (LOTENSIN ) 40 MG tablet   hydrochlorothiazide  (HYDRODIURIL ) 25 MG tablet     Endocrine   Type 2 diabetes mellitus with stage 2 chronic kidney disease, without long-term current use of insulin (HCC)   Doing well with A1c of 5.6. Will cut his metformin  to 500mg  daily. Recheck 6 months.       Relevant Medications   benazepril  (LOTENSIN ) 40 MG tablet   metFORMIN  (GLUCOPHAGE -XR) 500 MG 24 hr tablet   Other Relevant Orders   Bayer DCA Hb A1c Waived   Hypogonadism in male   Rechecking labs today. Await results. Treat as needed.       Relevant Orders   Testosterone , free, total([facility])     Genitourinary   Benign prostatic hyperplasia with urinary hesitancy   Under good control on current regimen. Continue current regimen. Continue to monitor. Call with any concerns. Refills given. Labs drawn today.         Other   Depression, major, single episode, moderate (HCC)   Under good control on current regimen. Continue current regimen. Continue to monitor. Call with any concerns. Refills given. Labs drawn today.        Relevant Medications   venlafaxine  XR (EFFEXOR -XR) 150 MG 24 hr capsule   Hypercholesteremia   Under good control on current regimen. Continue current regimen. Continue to monitor. Call with any concerns. Refills given. Labs drawn today.       Relevant Medications   amLODipine  (NORVASC ) 10 MG tablet   benazepril  (LOTENSIN ) 40 MG tablet   hydrochlorothiazide  (HYDRODIURIL ) 25 MG tablet   Other Visit Diagnoses       Routine general medical examination at a health care facility    -  Primary   Vaccines updated. Screening labs checked today. Colonoscopy up to date. Continue diet and exercise. Call with any concerns.   Relevant Orders   Comprehensive metabolic panel with GFR   CBC with Differential/Platelet   Lipid Panel w/o Chol/HDL Ratio   PSA   TSH   Microalbumin, Urine Waived   Hepatitis B surface antibody,quantitative     Essential hypertension       Relevant Medications   amLODipine  (NORVASC ) 10 MG tablet   benazepril  (LOTENSIN ) 40 MG tablet   hydrochlorothiazide  (HYDRODIURIL ) 25 MG tablet     Need for pneumococcal vaccine       Relevant Orders   Pneumococcal conjugate vaccine 20-valent (Prevnar 20) (Completed)        LABORATORY TESTING:  Health maintenance labs ordered today as discussed above.   The natural history of prostate cancer and ongoing controversy regarding screening and potential treatment outcomes of prostate cancer has been discussed with the patient. The meaning of a false positive PSA and a false negative PSA has been discussed. He indicates understanding of the limitations of this screening test and wishes to proceed with screening PSA testing.   IMMUNIZATIONS:   - Tdap: Tetanus vaccination status reviewed: last tetanus booster within 10 years. - Influenza: Given elsewhere - Pneumovax: Up to date - Prevnar: Administered today - COVID: Given elsewhere - HPV: Not applicable - Shingrix vaccine: Up to date  SCREENING: - Colonoscopy: Up to date   Discussed with patient purpose of the colonoscopy is to detect colon cancer at curable precancerous or early stages   PATIENT COUNSELING:    Sexuality: Discussed sexually transmitted diseases, partner selection, use of condoms, avoidance of unintended pregnancy  and contraceptive alternatives.   Advised to avoid cigarette smoking.  I discussed with the patient that most people either abstain from alcohol or drink within safe limits (<=14/week and <=4 drinks/occasion for males, <=7/weeks and <= 3 drinks/occasion for females) and that the risk for alcohol disorders and other health effects rises proportionally with the number of drinks per week and how often a drinker exceeds daily limits.  Discussed cessation/primary prevention of drug use and availability of treatment for abuse.   Diet: Encouraged to adjust caloric intake to maintain  or achieve ideal body weight, to reduce intake of dietary saturated fat and total fat, to limit sodium intake by avoiding high sodium foods and not adding table salt, and to maintain adequate dietary potassium and calcium preferably from fresh fruits, vegetables, and low-fat dairy products.    stressed the importance of regular exercise  Injury prevention: Discussed safety belts, safety helmets, smoke detector, smoking near bedding or upholstery.   Dental health: Discussed importance of regular tooth brushing, flossing, and dental visits.   Follow up plan: NEXT PREVENTATIVE PHYSICAL DUE IN 1 YEAR. Return in about 6 months (around 01/28/2025).

## 2024-08-04 LAB — HEPATITIS B SURFACE ANTIBODY, QUANTITATIVE: Hepatitis B Surf Ab Quant: <3.5 m[IU]/mL — ABNORMAL LOW

## 2024-08-04 LAB — CBC WITH DIFFERENTIAL/PLATELET
Basophils Absolute: 0 x10E3/uL (ref 0.0–0.2)
Basos: 1 %
EOS (ABSOLUTE): 0.2 x10E3/uL (ref 0.0–0.4)
Eos: 5 %
Hematocrit: 47.3 % (ref 37.5–51.0)
Hemoglobin: 16.2 g/dL (ref 13.0–17.7)
Immature Grans (Abs): 0 x10E3/uL (ref 0.0–0.1)
Immature Granulocytes: 0 %
Lymphocytes Absolute: 1.5 x10E3/uL (ref 0.7–3.1)
Lymphs: 34 %
MCH: 33.5 pg — ABNORMAL HIGH (ref 26.6–33.0)
MCHC: 34.2 g/dL (ref 31.5–35.7)
MCV: 98 fL — ABNORMAL HIGH (ref 79–97)
Monocytes Absolute: 0.4 x10E3/uL (ref 0.1–0.9)
Monocytes: 8 %
Neutrophils Absolute: 2.4 x10E3/uL (ref 1.4–7.0)
Neutrophils: 52 %
Platelets: 229 x10E3/uL (ref 150–450)
RBC: 4.84 x10E6/uL (ref 4.14–5.80)
RDW: 12.2 % (ref 11.6–15.4)
WBC: 4.5 x10E3/uL (ref 3.4–10.8)

## 2024-08-04 LAB — COMPREHENSIVE METABOLIC PANEL WITH GFR
ALT: 31 IU/L (ref 0–44)
AST: 30 IU/L (ref 0–40)
Albumin: 4.6 g/dL (ref 3.8–4.9)
Alkaline Phosphatase: 61 IU/L (ref 47–123)
BUN/Creatinine Ratio: 17 (ref 9–20)
BUN: 14 mg/dL (ref 6–24)
Bilirubin Total: 0.7 mg/dL (ref 0.0–1.2)
CO2: 23 mmol/L (ref 20–29)
Calcium: 10 mg/dL (ref 8.7–10.2)
Chloride: 100 mmol/L (ref 96–106)
Creatinine, Ser: 0.84 mg/dL (ref 0.76–1.27)
Globulin, Total: 2.7 g/dL (ref 1.5–4.5)
Glucose: 138 mg/dL — ABNORMAL HIGH (ref 70–99)
Potassium: 4 mmol/L (ref 3.5–5.2)
Sodium: 142 mmol/L (ref 134–144)
Total Protein: 7.3 g/dL (ref 6.0–8.5)
eGFR: 100 mL/min/1.73 (ref >59)

## 2024-08-04 LAB — TESTOSTERONE, FREE, TOTAL, SHBG
Sex Hormone Binding: 31.6 nmol/L (ref 19.3–76.4)
Testosterone, Free: 13 pg/mL (ref 7.2–24.0)
Testosterone: 279 ng/dL (ref 264–916)

## 2024-08-04 LAB — LIPID PANEL W/O CHOL/HDL RATIO
Cholesterol, Total: 252 mg/dL — ABNORMAL HIGH (ref 100–199)
HDL: 75 mg/dL (ref >39)
LDL Chol Calc (NIH): 138 mg/dL — ABNORMAL HIGH (ref 0–99)
Triglycerides: 219 mg/dL — ABNORMAL HIGH (ref 0–149)
VLDL Cholesterol Cal: 39 mg/dL (ref 5–40)

## 2024-08-04 LAB — PSA: Prostate Specific Ag, Serum: 4.7 ng/mL — ABNORMAL HIGH (ref 0.0–4.0)

## 2024-08-04 LAB — TSH: TSH: 1.2 u[IU]/mL (ref 0.450–4.500)

## 2024-08-06 ENCOUNTER — Ambulatory Visit: Payer: Self-pay | Admitting: Family Medicine

## 2024-08-06 MED ORDER — TESTOSTERONE 1.62 % TD GEL: 3.0000 | Freq: Every day | TRANSDERMAL | 5 refills | Status: DC

## 2024-08-10 ENCOUNTER — Other Ambulatory Visit: Payer: Self-pay | Admitting: Family Medicine

## 2024-08-11 NOTE — Telephone Encounter (Signed)
 Requested Prescriptions  Refused Prescriptions Disp Refills   Testosterone  1.62 % GEL [Pharmacy Med Name: TESTOSTERONE  1.62% GEL PUMP[!]]  0    Sig: APPLY 3 PUMPS TO SKIN DAILY     Off-Protocol Failed - 08/11/2024  2:15 PM      Failed - Medication not assigned to a protocol, review manually.      Passed - Valid encounter within last 12 months    Recent Outpatient Visits           1 week ago Routine general medical examination at a health care facility   [facility], [place] P, DO   4 months ago Type 2 diabetes mellitus with stage 2 chronic kidney disease, without long-term current use of insulin (HCC)   [facility] [place], [place] P, DO   6 months ago Acute left-sided back pain, unspecified back location   [facility] [place], Megan P, DO   6 months ago Routine general medical examination at a health care facility   [facility] P, DO

## 2024-08-21 ENCOUNTER — Other Ambulatory Visit: Payer: Self-pay | Admitting: Family Medicine

## 2024-08-21 NOTE — Telephone Encounter (Unsigned)
 Copied from CRM [id number]. Topic: Clinical - Medication Refill >> Aug 21, 2024 10:04 AM Tinnie C wrote: Medication: Testosterone  1.62 % GEL  Has the patient contacted their pharmacy? No He is wanting his medication trasnferred from [facility]:  This is the patient's preferred pharmacy:  [facility] [address] [facility] - [place], [place] - [address] [place] [address] Phone: [phone] Fax: [phone]  Is this the correct pharmacy for this prescription? Yes If no, delete pharmacy and type the correct one.   Has the prescription been filled recently? Yes  Is the patient out of the medication? No  Has the patient been seen for an appointment in the last year OR does the patient have an upcoming appointment? Yes, last seen 9/26, scheduled for 01/29/25  Can we respond through MyChart? Yes  Agent: Please be advised that Rx refills may take up to 3 business days. We ask that you follow-up with your pharmacy.

## 2024-08-24 MED ORDER — TESTOSTERONE 1.62 % TD GEL: 3.0000 | Freq: Every day | TRANSDERMAL | 5 refills | Status: AC

## 2024-08-24 NOTE — Telephone Encounter (Signed)
 Requested medication (s) are due for refill today- no  Requested medication (s) are on the active medication list -yes  Future visit scheduled -yes  Last refill: 08/21/24  Notes to clinic: Change in pharmacy requested- sent for review - off protocol  Requested Prescriptions  Pending Prescriptions Disp Refills   Testosterone  1.62 % GEL 225 g 5    Sig: Place 3 Pump onto the skin daily.     Off-Protocol Failed - 08/24/2024  2:05 PM      Failed - Medication not assigned to a protocol, review manually.      Passed - Valid encounter within last 12 months    Recent Outpatient Visits           3 weeks ago Routine general medical examination at a health care facility   [facility], [place] P, DO   5 months ago Type 2 diabetes mellitus with stage 2 chronic kidney disease, without long-term current use of insulin (HCC)   [facility] [place], Megan P, DO   6 months ago Acute left-sided back pain, unspecified back location   [facility] [place], Megan P, DO   7 months ago Routine general medical examination at a health care facility   [facility], [place] P, DO                 Requested Prescriptions  Pending Prescriptions Disp Refills   Testosterone  1.62 % GEL 225 g 5    Sig: Place 3 Pump onto the skin daily.     Off-Protocol Failed - 08/24/2024  2:05 PM      Failed - Medication not assigned to a protocol, review manually.      Passed - Valid encounter within last 12 months    Recent Outpatient Visits           3 weeks ago Routine general medical examination at a health care facility   [facility], [place] P, DO   5 months ago Type 2 diabetes mellitus with stage 2 chronic kidney disease, without long-term current use of insulin (HCC)    [facility] [place], Megan P, DO   6 months ago Acute left-sided back  pain, unspecified back location   [facility] [place], Megan P, DO   7 months ago Routine general medical examination at a health care facility   [facility], Megan P, DO

## 2025-01-29 ENCOUNTER — Ambulatory Visit: Admitting: Family Medicine
